# Patient Record
Sex: Male | Born: 1945
Health system: Southern US, Community
[De-identification: ages and names within clinical notes are randomized; demographics above are authoritative.]

## PROBLEM LIST (undated history)

## (undated) DIAGNOSIS — T7840XA Allergy, unspecified, initial encounter: Secondary | ICD-10-CM

## (undated) DIAGNOSIS — E559 Vitamin D deficiency, unspecified: Secondary | ICD-10-CM

## (undated) DIAGNOSIS — H269 Unspecified cataract: Secondary | ICD-10-CM

## (undated) DIAGNOSIS — I219 Acute myocardial infarction, unspecified: Secondary | ICD-10-CM

## (undated) DIAGNOSIS — M810 Age-related osteoporosis without current pathological fracture: Secondary | ICD-10-CM

## (undated) DIAGNOSIS — I10 Essential (primary) hypertension: Secondary | ICD-10-CM

## (undated) DIAGNOSIS — M109 Gout, unspecified: Secondary | ICD-10-CM

## (undated) DIAGNOSIS — N400 Enlarged prostate without lower urinary tract symptoms: Secondary | ICD-10-CM

## (undated) DIAGNOSIS — I2699 Other pulmonary embolism without acute cor pulmonale: Secondary | ICD-10-CM

## (undated) DIAGNOSIS — F329 Major depressive disorder, single episode, unspecified: Secondary | ICD-10-CM

## (undated) DIAGNOSIS — E7800 Pure hypercholesterolemia, unspecified: Secondary | ICD-10-CM

## (undated) DIAGNOSIS — G709 Myoneural disorder, unspecified: Secondary | ICD-10-CM

## (undated) DIAGNOSIS — F419 Anxiety disorder, unspecified: Secondary | ICD-10-CM

## (undated) DIAGNOSIS — E213 Hyperparathyroidism, unspecified: Secondary | ICD-10-CM

## (undated) HISTORY — PX: TRANSURETHRAL RESECTION OF PROSTATE: SHX73

## (undated) HISTORY — DX: Age-related osteoporosis without current pathological fracture: M81.0

## (undated) HISTORY — PX: PERIPHERAL VASCULAR THROMBECTOMY: CATH118306

## (undated) HISTORY — DX: Allergy, unspecified, initial encounter: T78.40XA

## (undated) HISTORY — DX: Unspecified cataract: H26.9

## (undated) HISTORY — PX: CATARACT EXTRACTION: SUR2

## (undated) HISTORY — DX: Hyperparathyroidism, unspecified: E21.3

## (undated) HISTORY — PX: VASECTOMY: SHX75

## (undated) HISTORY — DX: Acute myocardial infarction, unspecified: I21.9

## (undated) HISTORY — DX: Myoneural disorder, unspecified: G70.9

## (undated) HISTORY — DX: Vitamin D deficiency, unspecified: E55.9

## (undated) HISTORY — DX: Anxiety disorder, unspecified: F41.9

---
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
## 2013-12-03 DIAGNOSIS — N400 Enlarged prostate without lower urinary tract symptoms: Secondary | ICD-10-CM | POA: Diagnosis not present

## 2013-12-03 DIAGNOSIS — E782 Mixed hyperlipidemia: Secondary | ICD-10-CM | POA: Diagnosis not present

## 2013-12-03 DIAGNOSIS — E559 Vitamin D deficiency, unspecified: Secondary | ICD-10-CM | POA: Diagnosis not present

## 2013-12-17 DIAGNOSIS — E782 Mixed hyperlipidemia: Secondary | ICD-10-CM | POA: Diagnosis not present

## 2013-12-17 DIAGNOSIS — N400 Enlarged prostate without lower urinary tract symptoms: Secondary | ICD-10-CM | POA: Diagnosis not present

## 2013-12-17 DIAGNOSIS — E559 Vitamin D deficiency, unspecified: Secondary | ICD-10-CM | POA: Diagnosis not present

## 2013-12-25 DIAGNOSIS — E782 Mixed hyperlipidemia: Secondary | ICD-10-CM | POA: Diagnosis not present

## 2013-12-25 DIAGNOSIS — Z0389 Encounter for observation for other suspected diseases and conditions ruled out: Secondary | ICD-10-CM | POA: Diagnosis not present

## 2013-12-25 DIAGNOSIS — Z0000 Encounter for general adult medical examination without abnormal findings: Secondary | ICD-10-CM | POA: Diagnosis not present

## 2013-12-25 DIAGNOSIS — E669 Obesity, unspecified: Secondary | ICD-10-CM | POA: Diagnosis not present

## 2013-12-25 DIAGNOSIS — E559 Vitamin D deficiency, unspecified: Secondary | ICD-10-CM | POA: Diagnosis not present

## 2013-12-26 DIAGNOSIS — R6882 Decreased libido: Secondary | ICD-10-CM | POA: Diagnosis not present

## 2013-12-26 DIAGNOSIS — E559 Vitamin D deficiency, unspecified: Secondary | ICD-10-CM | POA: Diagnosis not present

## 2013-12-26 DIAGNOSIS — Z0000 Encounter for general adult medical examination without abnormal findings: Secondary | ICD-10-CM | POA: Diagnosis not present

## 2013-12-26 DIAGNOSIS — E669 Obesity, unspecified: Secondary | ICD-10-CM | POA: Diagnosis not present

## 2013-12-26 DIAGNOSIS — E782 Mixed hyperlipidemia: Secondary | ICD-10-CM | POA: Diagnosis not present

## 2013-12-26 DIAGNOSIS — R972 Elevated prostate specific antigen [PSA]: Secondary | ICD-10-CM | POA: Diagnosis not present

## 2014-01-19 DIAGNOSIS — E291 Testicular hypofunction: Secondary | ICD-10-CM | POA: Diagnosis not present

## 2014-04-23 DIAGNOSIS — E559 Vitamin D deficiency, unspecified: Secondary | ICD-10-CM | POA: Diagnosis not present

## 2014-04-23 DIAGNOSIS — N400 Enlarged prostate without lower urinary tract symptoms: Secondary | ICD-10-CM | POA: Diagnosis not present

## 2014-04-23 DIAGNOSIS — E669 Obesity, unspecified: Secondary | ICD-10-CM | POA: Diagnosis not present

## 2014-04-23 DIAGNOSIS — E291 Testicular hypofunction: Secondary | ICD-10-CM | POA: Diagnosis not present

## 2014-04-30 DIAGNOSIS — R5383 Other fatigue: Secondary | ICD-10-CM | POA: Diagnosis not present

## 2014-04-30 DIAGNOSIS — E291 Testicular hypofunction: Secondary | ICD-10-CM | POA: Diagnosis not present

## 2014-04-30 DIAGNOSIS — E669 Obesity, unspecified: Secondary | ICD-10-CM | POA: Diagnosis not present

## 2014-04-30 DIAGNOSIS — R5381 Other malaise: Secondary | ICD-10-CM | POA: Diagnosis not present

## 2014-04-30 DIAGNOSIS — E559 Vitamin D deficiency, unspecified: Secondary | ICD-10-CM | POA: Diagnosis not present

## 2014-04-30 DIAGNOSIS — N400 Enlarged prostate without lower urinary tract symptoms: Secondary | ICD-10-CM | POA: Diagnosis not present

## 2014-07-30 DIAGNOSIS — E782 Mixed hyperlipidemia: Secondary | ICD-10-CM | POA: Diagnosis not present

## 2014-07-30 DIAGNOSIS — E559 Vitamin D deficiency, unspecified: Secondary | ICD-10-CM | POA: Diagnosis not present

## 2014-07-30 DIAGNOSIS — E669 Obesity, unspecified: Secondary | ICD-10-CM | POA: Diagnosis not present

## 2014-07-30 DIAGNOSIS — E291 Testicular hypofunction: Secondary | ICD-10-CM | POA: Diagnosis not present

## 2014-08-13 DIAGNOSIS — N400 Enlarged prostate without lower urinary tract symptoms: Secondary | ICD-10-CM | POA: Diagnosis not present

## 2014-08-13 DIAGNOSIS — E559 Vitamin D deficiency, unspecified: Secondary | ICD-10-CM | POA: Diagnosis not present

## 2014-08-13 DIAGNOSIS — E669 Obesity, unspecified: Secondary | ICD-10-CM | POA: Diagnosis not present

## 2014-08-13 DIAGNOSIS — E291 Testicular hypofunction: Secondary | ICD-10-CM | POA: Diagnosis not present

## 2014-08-13 DIAGNOSIS — E782 Mixed hyperlipidemia: Secondary | ICD-10-CM | POA: Diagnosis not present

## 2014-12-03 DIAGNOSIS — I10 Essential (primary) hypertension: Secondary | ICD-10-CM | POA: Diagnosis not present

## 2015-04-01 DIAGNOSIS — E785 Hyperlipidemia, unspecified: Secondary | ICD-10-CM | POA: Diagnosis not present

## 2015-04-01 DIAGNOSIS — E559 Vitamin D deficiency, unspecified: Secondary | ICD-10-CM | POA: Diagnosis not present

## 2015-04-01 DIAGNOSIS — R972 Elevated prostate specific antigen [PSA]: Secondary | ICD-10-CM | POA: Diagnosis not present

## 2015-04-01 DIAGNOSIS — E291 Testicular hypofunction: Secondary | ICD-10-CM | POA: Diagnosis not present

## 2015-04-01 DIAGNOSIS — N400 Enlarged prostate without lower urinary tract symptoms: Secondary | ICD-10-CM | POA: Diagnosis not present

## 2015-04-01 DIAGNOSIS — R5381 Other malaise: Secondary | ICD-10-CM | POA: Diagnosis not present

## 2015-04-01 DIAGNOSIS — Z131 Encounter for screening for diabetes mellitus: Secondary | ICD-10-CM | POA: Diagnosis not present

## 2015-04-01 DIAGNOSIS — I10 Essential (primary) hypertension: Secondary | ICD-10-CM | POA: Diagnosis not present

## 2015-04-20 ENCOUNTER — Ambulatory Visit (HOSPITAL_COMMUNITY)
Admission: RE | Admit: 2015-04-20 | Discharge: 2015-04-20 | Disposition: A | Discharge status: Home / Self Care | Payer: Medicare Other | Source: Ambulatory Visit | Attending: Internal Medicine | Admitting: Internal Medicine

## 2015-04-20 ENCOUNTER — Other Ambulatory Visit (HOSPITAL_COMMUNITY): Payer: Self-pay | Admitting: Internal Medicine

## 2015-04-20 DIAGNOSIS — R509 Fever, unspecified: Secondary | ICD-10-CM | POA: Diagnosis not present

## 2015-04-20 DIAGNOSIS — F419 Anxiety disorder, unspecified: Secondary | ICD-10-CM | POA: Diagnosis not present

## 2015-04-20 DIAGNOSIS — R079 Chest pain, unspecified: Secondary | ICD-10-CM | POA: Diagnosis not present

## 2015-04-20 DIAGNOSIS — N400 Enlarged prostate without lower urinary tract symptoms: Secondary | ICD-10-CM

## 2015-04-20 DIAGNOSIS — I10 Essential (primary) hypertension: Secondary | ICD-10-CM | POA: Diagnosis not present

## 2015-04-20 DIAGNOSIS — R0789 Other chest pain: Secondary | ICD-10-CM | POA: Insufficient documentation

## 2015-04-20 DIAGNOSIS — E539 Vitamin B deficiency, unspecified: Secondary | ICD-10-CM

## 2015-04-20 DIAGNOSIS — R972 Elevated prostate specific antigen [PSA]: Secondary | ICD-10-CM

## 2015-04-20 DIAGNOSIS — J9811 Atelectasis: Secondary | ICD-10-CM | POA: Diagnosis not present

## 2015-04-20 DIAGNOSIS — J449 Chronic obstructive pulmonary disease, unspecified: Secondary | ICD-10-CM | POA: Diagnosis not present

## 2015-04-20 DIAGNOSIS — R0781 Pleurodynia: Secondary | ICD-10-CM

## 2015-04-20 DIAGNOSIS — I2699 Other pulmonary embolism without acute cor pulmonale: Secondary | ICD-10-CM | POA: Diagnosis not present

## 2015-04-20 DIAGNOSIS — R071 Chest pain on breathing: Secondary | ICD-10-CM | POA: Diagnosis not present

## 2015-04-20 DIAGNOSIS — J90 Pleural effusion, not elsewhere classified: Secondary | ICD-10-CM | POA: Diagnosis not present

## 2015-04-20 DIAGNOSIS — R0602 Shortness of breath: Secondary | ICD-10-CM | POA: Diagnosis not present

## 2015-04-20 DIAGNOSIS — E780 Pure hypercholesterolemia: Secondary | ICD-10-CM | POA: Diagnosis not present

## 2015-04-20 DIAGNOSIS — E785 Hyperlipidemia, unspecified: Secondary | ICD-10-CM | POA: Diagnosis not present

## 2015-04-21 ENCOUNTER — Encounter (HOSPITAL_COMMUNITY): Payer: Self-pay

## 2015-04-21 ENCOUNTER — Other Ambulatory Visit: Payer: Self-pay

## 2015-04-21 ENCOUNTER — Inpatient Hospital Stay (HOSPITAL_COMMUNITY)
Admission: EM | Admit: 2015-04-21 | Discharge: 2015-04-22 | DRG: 176 | Disposition: A | Discharge status: Home / Self Care | Payer: Medicare Other | Attending: Internal Medicine | Admitting: Internal Medicine

## 2015-04-21 ENCOUNTER — Emergency Department (HOSPITAL_COMMUNITY): Payer: Medicare Other

## 2015-04-21 ENCOUNTER — Inpatient Hospital Stay (HOSPITAL_COMMUNITY): Payer: Medicare Other

## 2015-04-21 DIAGNOSIS — E785 Hyperlipidemia, unspecified: Secondary | ICD-10-CM | POA: Diagnosis present

## 2015-04-21 DIAGNOSIS — J9811 Atelectasis: Secondary | ICD-10-CM | POA: Diagnosis not present

## 2015-04-21 DIAGNOSIS — R0602 Shortness of breath: Secondary | ICD-10-CM | POA: Diagnosis present

## 2015-04-21 DIAGNOSIS — I2699 Other pulmonary embolism without acute cor pulmonale: Secondary | ICD-10-CM

## 2015-04-21 DIAGNOSIS — F419 Anxiety disorder, unspecified: Secondary | ICD-10-CM | POA: Diagnosis present

## 2015-04-21 DIAGNOSIS — N400 Enlarged prostate without lower urinary tract symptoms: Secondary | ICD-10-CM | POA: Diagnosis present

## 2015-04-21 DIAGNOSIS — Z86711 Personal history of pulmonary embolism: Secondary | ICD-10-CM | POA: Diagnosis present

## 2015-04-21 DIAGNOSIS — E780 Pure hypercholesterolemia: Secondary | ICD-10-CM | POA: Diagnosis present

## 2015-04-21 DIAGNOSIS — I10 Essential (primary) hypertension: Secondary | ICD-10-CM

## 2015-04-21 DIAGNOSIS — M109 Gout, unspecified: Secondary | ICD-10-CM | POA: Diagnosis present

## 2015-04-21 DIAGNOSIS — J90 Pleural effusion, not elsewhere classified: Secondary | ICD-10-CM | POA: Diagnosis not present

## 2015-04-21 DIAGNOSIS — E782 Mixed hyperlipidemia: Secondary | ICD-10-CM

## 2015-04-21 DIAGNOSIS — J449 Chronic obstructive pulmonary disease, unspecified: Secondary | ICD-10-CM | POA: Diagnosis not present

## 2015-04-21 HISTORY — DX: Pure hypercholesterolemia, unspecified: E78.00

## 2015-04-21 HISTORY — DX: Benign prostatic hyperplasia without lower urinary tract symptoms: N40.0

## 2015-04-21 HISTORY — DX: Gout, unspecified: M10.9

## 2015-04-21 HISTORY — DX: Essential (primary) hypertension: I10

## 2015-04-21 LAB — CBC WITH DIFFERENTIAL/PLATELET
Basophils Absolute: 0 10*3/uL (ref 0.0–0.1)
Basophils Relative: 0 % (ref 0–1)
Eosinophils Absolute: 0.1 10*3/uL (ref 0.0–0.7)
Eosinophils Relative: 1 % (ref 0–5)
HEMATOCRIT: 47.5 % (ref 39.0–52.0)
HEMOGLOBIN: 16.3 g/dL (ref 13.0–17.0)
LYMPHS ABS: 1.4 10*3/uL (ref 0.7–4.0)
Lymphocytes Relative: 11 % — ABNORMAL LOW (ref 12–46)
MCH: 33.7 pg (ref 26.0–34.0)
MCHC: 34.3 g/dL (ref 30.0–36.0)
MCV: 98.3 fL (ref 78.0–100.0)
MONOS PCT: 8 % (ref 3–12)
Monocytes Absolute: 1 10*3/uL (ref 0.1–1.0)
NEUTROS PCT: 80 % — AB (ref 43–77)
Neutro Abs: 10.4 10*3/uL — ABNORMAL HIGH (ref 1.7–7.7)
Platelets: 140 10*3/uL — ABNORMAL LOW (ref 150–400)
RBC: 4.83 MIL/uL (ref 4.22–5.81)
RDW: 12.7 % (ref 11.5–15.5)
WBC: 12.9 10*3/uL — ABNORMAL HIGH (ref 4.0–10.5)

## 2015-04-21 LAB — COMPREHENSIVE METABOLIC PANEL
ALK PHOS: 60 U/L (ref 38–126)
ALT: 14 U/L — AB (ref 17–63)
ANION GAP: 11 (ref 5–15)
AST: 21 U/L (ref 15–41)
Albumin: 4.4 g/dL (ref 3.5–5.0)
BILIRUBIN TOTAL: 1.5 mg/dL — AB (ref 0.3–1.2)
BUN: 14 mg/dL (ref 6–20)
CO2: 24 mmol/L (ref 22–32)
Calcium: 10.8 mg/dL — ABNORMAL HIGH (ref 8.9–10.3)
Chloride: 104 mmol/L (ref 101–111)
Creatinine, Ser: 1.03 mg/dL (ref 0.61–1.24)
GFR calc Af Amer: >60 mL/min (ref >60)
GLUCOSE: 136 mg/dL — AB (ref 65–99)
POTASSIUM: 4.5 mmol/L (ref 3.5–5.1)
SODIUM: 139 mmol/L (ref 135–145)
TOTAL PROTEIN: 8.2 g/dL — AB (ref 6.5–8.1)

## 2015-04-21 LAB — I-STAT TROPONIN, ED: TROPONIN I, POC: 0.01 ng/mL (ref 0.00–0.08)

## 2015-04-21 MED ORDER — HYDROMORPHONE HCL 1 MG/ML IJ SOLN
0.5000 mg | Freq: Once | INTRAMUSCULAR | Status: AC
  Administered 2015-04-21: 0.5 mg via INTRAVENOUS
  Filled 2015-04-21: qty 1

## 2015-04-21 MED ORDER — OXYCODONE HCL 5 MG PO TABS
5.0000 mg | ORAL_TABLET | ORAL | Status: DC | PRN
  Administered 2015-04-21 – 2015-04-22 (×4): 5 mg via ORAL
  Filled 2015-04-21 (×4): qty 1

## 2015-04-21 MED ORDER — SIMVASTATIN 20 MG PO TABS
20.0000 mg | ORAL_TABLET | ORAL | Status: DC
  Filled 2015-04-21: qty 1

## 2015-04-21 MED ORDER — ACETAMINOPHEN 325 MG PO TABS
650.0000 mg | ORAL_TABLET | Freq: Four times a day (QID) | ORAL | Status: DC | PRN
  Administered 2015-04-21: 650 mg via ORAL
  Filled 2015-04-21: qty 2

## 2015-04-21 MED ORDER — ACETAMINOPHEN 650 MG RE SUPP: 650.0000 mg | Freq: Four times a day (QID) | RECTAL | Status: DC | PRN

## 2015-04-21 MED ORDER — VENLAFAXINE HCL ER 75 MG PO CP24
150.0000 mg | ORAL_CAPSULE | Freq: Every day | ORAL | Status: DC
  Administered 2015-04-22: 150 mg via ORAL
  Filled 2015-04-21: qty 2

## 2015-04-21 MED ORDER — ONDANSETRON HCL 4 MG/2ML IJ SOLN: 4.0000 mg | Freq: Four times a day (QID) | INTRAMUSCULAR | Status: DC | PRN

## 2015-04-21 MED ORDER — SODIUM CHLORIDE 0.9 % IJ SOLN
3.0000 mL | Freq: Two times a day (BID) | INTRAMUSCULAR | Status: DC
Start: 2015-04-21 — End: 2015-04-22
  Administered 2015-04-21 – 2015-04-22 (×2): 3 mL via INTRAVENOUS

## 2015-04-21 MED ORDER — ENSURE ENLIVE PO LIQD
237.0000 mL | Freq: Two times a day (BID) | ORAL | Status: DC
  Administered 2015-04-21 – 2015-04-22 (×2): 237 mL via ORAL

## 2015-04-21 MED ORDER — IOHEXOL 350 MG/ML SOLN
100.0000 mL | Freq: Once | INTRAVENOUS | Status: AC | PRN
  Administered 2015-04-21: 100 mL via INTRAVENOUS

## 2015-04-21 MED ORDER — ONDANSETRON HCL 4 MG PO TABS
4.0000 mg | ORAL_TABLET | Freq: Four times a day (QID) | ORAL | Status: DC | PRN
  Administered 2015-04-21: 4 mg via ORAL
  Filled 2015-04-21: qty 1

## 2015-04-21 MED ORDER — LIDOCAINE HCL (PF) 2 % IJ SOLN
INTRAMUSCULAR | Status: AC
  Filled 2015-04-21: qty 10

## 2015-04-21 MED ORDER — SENNOSIDES-DOCUSATE SODIUM 8.6-50 MG PO TABS
1.0000 | ORAL_TABLET | Freq: Every evening | ORAL | Status: DC | PRN
  Administered 2015-04-22: 1 via ORAL
  Filled 2015-04-21: qty 1

## 2015-04-21 MED ORDER — RIVAROXABAN 15 MG PO TABS
15.0000 mg | ORAL_TABLET | Freq: Two times a day (BID) | ORAL | Status: DC
  Administered 2015-04-21 – 2015-04-22 (×2): 15 mg via ORAL
  Filled 2015-04-21 (×2): qty 1

## 2015-04-21 MED ORDER — MORPHINE SULFATE 2 MG/ML IJ SOLN
1.0000 mg | INTRAMUSCULAR | Status: DC | PRN
  Administered 2015-04-21: 1 mg via INTRAVENOUS
  Filled 2015-04-21: qty 1

## 2015-04-21 MED ORDER — SODIUM CHLORIDE 0.9 % IV SOLN
INTRAVENOUS | Status: DC
  Administered 2015-04-21: 15:00:00 via INTRAVENOUS

## 2015-04-21 MED ORDER — RIVAROXABAN 20 MG PO TABS: 20.0000 mg | ORAL_TABLET | Freq: Every day | ORAL | Status: DC

## 2015-04-21 MED ORDER — ALLOPURINOL 300 MG PO TABS
300.0000 mg | ORAL_TABLET | Freq: Every day | ORAL | Status: DC
  Administered 2015-04-22: 300 mg via ORAL
  Filled 2015-04-21: qty 1

## 2015-04-21 MED ORDER — ENOXAPARIN SODIUM 100 MG/ML ~~LOC~~ SOLN
1.0000 mg/kg | Freq: Once | SUBCUTANEOUS | Status: AC
  Administered 2015-04-21: 85 mg via SUBCUTANEOUS
  Filled 2015-04-21: qty 1

## 2015-04-21 NOTE — ED Notes (Addendum)
Attempted report x1. Spoke with Diplomatic Services operational officer who states she needs to notify RN of patient and RN will call back shortly.

## 2015-04-21 NOTE — H&P (Signed)
[facility]          History and Physical    PCP:   Doree Albee, MD   EDP: Quintella Reichert, MD  Chief Complaint:  SOB   HPI: 60yom presented with SOB, back pain after PCP recommended pt receive XR for these symptoms. Reports left leg edema, on and off for a couple of months.  Symptoms started two days ago. Severity has increased overnight.  He has a mild non productive cough, no CP, abd pain, v/n/d.  Pain worseness when breathing.  Pt reports traveling for a long car ride in June and noticed the left leg edema around that time. Reports traveling long periods of time multiple times. He notes symptoms to be severe and worsening.      PMHx includes, hypertension, BPH, hyperlipidemia , right bundle branch. Reports he decreased EtOH use over the past few weeks.    Allergies:   Allergies  Allergen Reactions  . Sulfa Antibiotics Hives      Past Medical History  Diagnosis Date  . Hypertension   . BPH (benign prostatic hyperplasia)   . Gout   . Hypercholesterolemia     History reviewed. No pertinent past surgical history.  Prior to Admission medications   Medication Sig Start Date End Date Taking? Authorizing Provider  acetaminophen (TYLENOL) 325 MG tablet Take 650 mg by mouth every 6 (six) hours as needed for fever.   Yes Historical Provider, MD  allopurinol (ZYLOPRIM) 300 MG tablet Take 300 mg by mouth daily.   Yes Historical Provider, MD  Cholecalciferol (VITAMIN D) 2000 UNITS CAPS Take 1 capsule by mouth daily.   Yes Historical Provider, MD  ibuprofen (ADVIL,MOTRIN) 200 MG tablet Take 600 mg by mouth daily as needed for moderate pain.   Yes Historical Provider, MD  simvastatin (ZOCOR) 20 MG tablet Take 20 mg by mouth every other day.   Yes Historical Provider, MD  venlafaxine XR (EFFEXOR-XR) 150 MG 24 hr capsule Take 150 mg by mouth daily with breakfast.   Yes Historical Provider, MD    Social History:  reports that he has never smoked. He does  not have any smokeless tobacco history on file. He reports that he drinks alcohol. He reports that he does not use illicit drugs.  Family history: Brother had DVT Maternal grandparents - hx of cancer  Review of Systems:  Constitutional: Denies fever, chills, diaphoresis, appetite change and fatigue.  HEENT: Denies photophobia, eye pain, redness, hearing loss, ear pain, congestion, sore throat, rhinorrhea, sneezing, mouth sores, trouble swallowing, neck pain, neck stiffness and tinnitus.   Respiratory: Denies  cough, chest tightness,  and wheezing.   Cardiovascular: Denies palpitations  Gastrointestinal: Denies nausea, vomiting, abdominal pain, diarrhea, constipation, blood in stool and abdominal distention.  Genitourinary: Denies dysuria, urgency, frequency, hematuria, flank pain and difficulty urinating.  Endocrine: Denies: hot or cold intolerance, sweats, changes in hair or nails, polyuria, polydipsia. Musculoskeletal: Denies myalgias, back pain, joint swelling, arthralgias and gait problem.  Skin: Denies pallor, rash and wound.  Neurological: Denies dizziness, seizures, syncope, weakness, light-headedness, numbness and headaches.  Hematological: Denies adenopathy. Easy bruising, personal or family bleeding history  Psychiatric/Behavioral: Denies suicidal ideation, mood changes, confusion, nervousness, sleep disturbance and agitation   Physical Exam: Blood pressure 151/91, pulse 82, temperature 98.1 F (36.7 C), temperature source Oral, resp. rate 29, height 5' 9"  (1.753 m), weight 83.462 kg (184 lb), SpO2 90 %.  General:  Appears comfortable, calm, sitting up in chair. Eyes: PERRL, normal lids, irises ENT: grossly normal hearing, lips, tongue Neck: no LAD, masses, thyromegaly Cardiovascular: Regular rate and rhythm, no murmur, rub or gallop. Respiratory: Clear to auscultation bilaterally, no wheezes, rales or rhonchi. Pain increases with breathing Abdomen: soft, ntnd Skin: no  rash or induration  Musculoskeletal: left leg edema, not present in right  Psychiatric: grossly normal mood and affect, speech fluent and appropriate Neurologic: grossly non-focal.   Labs on Admission:  Results for orders placed or performed during the hospital encounter of 04/21/15 (from the past 48 hour(s))  Comprehensive metabolic panel     Status: Abnormal   Collection Time: 04/21/15  7:39 AM  Result Value Ref Range   Sodium 139 135 - 145 mmol/L   Potassium 4.5 3.5 - 5.1 mmol/L   Chloride 104 101 - 111 mmol/L   CO2 24 22 - 32 mmol/L   Glucose, Bld 136 (H) 65 - 99 mg/dL   BUN 14 6 - 20 mg/dL   Creatinine, Ser 1.03 0.61 - 1.24 mg/dL   Calcium 10.8 (H) 8.9 - 10.3 mg/dL   Total Protein 8.2 (H) 6.5 - 8.1 g/dL   Albumin 4.4 3.5 - 5.0 g/dL   AST 21 15 - 41 U/L   ALT 14 (L) 17 - 63 U/L   Alkaline Phosphatase 60 38 - 126 U/L   Total Bilirubin 1.5 (H) 0.3 - 1.2 mg/dL   GFR calc non Af Amer >60 >60 mL/min   GFR calc Af Amer >60 >60 mL/min    Comment: (NOTE) The eGFR has been calculated using the CKD EPI equation. This calculation has not been validated in all clinical situations. eGFR's persistently <60 mL/min signify possible Chronic Kidney Disease.    Anion gap 11 5 - 15  CBC with Differential     Status: Abnormal   Collection Time: 04/21/15  7:39 AM  Result Value Ref Range   WBC 12.9 (H) 4.0 - 10.5 K/uL   RBC 4.83 4.22 - 5.81 MIL/uL   Hemoglobin 16.3 13.0 - 17.0 g/dL   HCT 47.5 39.0 - 52.0 %   MCV 98.3 78.0 - 100.0 fL   MCH 33.7 26.0 - 34.0 pg   MCHC 34.3 30.0 - 36.0 g/dL   RDW 12.7 11.5 - 15.5 %   Platelets 140 (L) 150 - 400 K/uL   Neutrophils Relative % 80 (H) 43 - 77 %   Neutro Abs 10.4 (H) 1.7 - 7.7 K/uL   Lymphocytes Relative 11 (L) 12 - 46 %   Lymphs Abs 1.4 0.7 - 4.0 K/uL   Monocytes Relative 8 3 - 12 %   Monocytes Absolute 1.0 0.1 - 1.0 K/uL   Eosinophils Relative 1 0 - 5 %   Eosinophils Absolute 0.1 0.0 - 0.7 K/uL   Basophils Relative 0 0 - 1 %   Basophils  Absolute 0.0 0.0 - 0.1 K/uL  I-stat troponin, ED     Status: None   Collection Time: 04/21/15  7:47 AM  Result Value Ref Range   Troponin i, poc 0.01 0.00 - 0.08 ng/mL   Comment 3            Comment: Due to the release kinetics of cTnI, a negative result within the first hours of the onset of symptoms does not rule out myocardial infarction with certainty. If myocardial infarction is still suspected, repeat the test at appropriate intervals.     Radiological Exams on Admission: Dg  Chest 2 View  04/20/2015   CLINICAL DATA:  Lower right-sided chest wall pain with low-grade fever and pleuritic chest pain since yesterday  EXAM: CHEST  2 VIEW  COMPARISON:  None in PACs  FINDINGS: The lungs are borderline hypoinflated. There is bibasilar atelectasis. A trace of pleural fluid is suspected blunting the costophrenic angles laterally. The heart is top-normal in size. The pulmonary vascularity is normal. The trachea is midline. The bony thorax exhibits no acute abnormality.  IMPRESSION: Bibasilar atelectasis and possible tiny pleural effusions. Correlation with patient's clinical and laboratory values is needed. Chest CT scanning may be useful to exclude underlying pulmonary embolism.   Electronically Signed   By: David  Martinique M.D.   On: 04/20/2015 11:03   Ct Angio Chest Pe W/cm &/or Wo Cm  04/21/2015   CLINICAL DATA:  Short of breath.  Right chest pain  EXAM: CT ANGIOGRAPHY CHEST WITH CONTRAST  TECHNIQUE: Multidetector CT imaging of the chest was performed using the standard protocol during bolus administration of intravenous contrast. Multiplanar CT image reconstructions and MIPs were obtained to evaluate the vascular anatomy.  CONTRAST:  160m OMNIPAQUE IOHEXOL 350 MG/ML SOLN  COMPARISON:  Chest x-ray 04/20/2015  FINDINGS: Bilateral pulmonary pulmonary emboli in the lower lobes bilaterally. Small amount of clot in the left upper lobe. Moderate clot burden. Pulmonary arteries normal in caliber. RV/ LV  ratio 1.4 suggesting mild right heart strain.  Thoracic aorta normal in caliber. No dissection. Negative for pericardial effusion.  Elevated right hemidiaphragm. Small right pleural effusion. Bibasilar atelectasis right greater than left.  Negative for mass or adenopathy.  COPD with mild apical emphysema. Mild mosaic density in the upper lobes bilaterally may be related to mild edema.  Calcification in the dome of the liver on the right compatible with granulomatous chronic infection.  No acute skeletal abnormality.  Review of the MIP images confirms the above findings.  IMPRESSION: Moderate bilateral pulmonary emboli. Mild right heart strain with artery/ LV ratio 1.4  Mild COPD  Small right effusion.  Mild bibasilar atelectasis.  Critical Value/emergent results were called by telephone at the time of interpretation on 04/21/2015 at 9:02 am to Dr. EQuintella Reichert, who verbally acknowledged these results.   Electronically Signed   By: CFranchot GalloM.D.   On: 04/21/2015 09:04    Assessment/Plan Active Problems:   Pulmonary embolism  PE - Start Xarelto - Because of heart strain seen on CT will order ECHO - Will likely need Xarelto for 6 months, seems to be a provoked episode given recent long car rides  HTN - not on any medications currently, will follow  HLD - Continue statin  Anxiety disorder - Continue Effexor   Time Spent on Admission: 60 minutes   EDomingo Mend MD [facility] Pager: [phone] 04/21/2015, 11:13 AM   I, ASalvadore Oxford acting a scribe, recorded this note contemporaneously in the presence of Dr. EDomingo Mendon 04/21/2015   I have reviewed the above documentation for accuracy and completeness, and I agree with the above.  EDomingo Mend MD [facility] Pager: [phone]

## 2015-04-21 NOTE — ED Notes (Signed)
Gave pt water to drink with MD Madilyn Hook approval.

## 2015-04-21 NOTE — ED Notes (Signed)
Pt c/o of increased pain after CT scan. MD Madilyn Hook aware.

## 2015-04-21 NOTE — ED Notes (Signed)
MD Rees at bedside updating patient and family.  

## 2015-04-21 NOTE — Progress Notes (Signed)
Initial Nutrition Assessment  DOCUMENTATION CODES:  Not applicable  INTERVENTION:  Ensure Enlive po BID, each supplement provides 350 kcal and 20 grams of protein  Helped him with his dinner options  NUTRITION DIAGNOSIS:  Inadequate oral intake related to poor appetite as evidenced by reportedly losing 10 lbs over the last 6 weeks  GOAL:  Patient will meet greater than or equal to 90% of their needs  MONITOR:  PO intake, Supplement acceptance, Labs  REASON FOR ASSESSMENT:  Malnutrition Screening Tool    ASSESSMENT:  68yom PMHx: HTN, BPH, HLD, Gout presents with SOB, back pain. Reports left leg edema, on and off for a couple of months. He has a mild non productive cough,. Pain worseness when breathing. Diagnosed with Pulmonary Embolism.   Pt states that he has not been feeling well for 6 weeks and has had severe pain for a couple days. He has not been eating quite as well for this time period. Denies n/v/d. He is constipated. At home he reports eating a lot of vegetables and he was saying that the hospital food is much too salty and includes too many starches. He mainly eats fresh foods. Took down a couple dinner preferences/requests.   He states his normal weight is 195 lbs. There is no prior documentation to confirm this  He was agreeable to Ensure. He drinks a protein drink for breakfast each morning.   Diet Order:  Diet Heart Room service appropriate?: Yes; Fluid consistency:: Thin  Skin:  Reviewed, no issues  Last BM:  8/9  Height:  Ht Readings from Last 1 Encounters:  04/21/15  (1.753 m)   Weight:  Wt Readings from Last 1 Encounters:  04/21/15 184 lb (83.462 kg)   Ideal Body Weight:  72.7 kg  BMI:  Body mass index is 27.16 kg/(m^2).  Estimated Nutritional Needs:  Kcal:  1750-1900 kcals, (21-23 kcal/kg) Protein:  73-87 (1-1.2 g/kg IBW) Fluid:  1.8-1.9 liters  EDUCATION NEEDS:  Education needs addressed  Christophe Louis RD, LDN Nutrition Pager:  [phone] 04/21/2015 3:45 PM

## 2015-04-21 NOTE — ED Provider Notes (Signed)
CSN: [id number]     Arrival date & time 04/21/15  6295 History   First MD Initiated Contact with Patient 04/21/15 [phone]     Chief Complaint  Patient presents with  . Shortness of Breath     Patient is a 69 y.o. male presenting with shortness of breath. The history is provided by the patient and the spouse. No language interpreter was used.  Shortness of Breath  Mr. Valley presents for evaluation of thoracic back pain on the right side as well shortness of breath. Symptoms started 2 days ago and were sudden in onset. He has pain with deep breathing. He saw his family doctor yesterday for the symptoms during a follow-up for intermittent left leg swelling for the last month. He had a chest x-ray was told he had some fluid on his lungs and he needed to get evaluated in the emergency department. He's had fevers to 100.4 intermittently for the last several weeks as well as night sweats. He has a slight cough, no chest pain, abdominal pain, vomiting, diarrhea. He has a history of hypertension, hyperlipidemia, BPH, gout. Symptoms are severe, constant, worsening.  Past Medical History  Diagnosis Date  . Hypertension   . BPH (benign prostatic hyperplasia)   . Gout   . Hypercholesterolemia    History reviewed. No pertinent past surgical history. No family history on file. Social History  Substance Use Topics  . Smoking status: Never Smoker   . Smokeless tobacco: None  . Alcohol Use: Yes     Comment: former    Review of Systems  Respiratory: Positive for shortness of breath.   All other systems reviewed and are negative.     Allergies  Sulfa antibiotics  Home Medications   Prior to Admission medications   Not on File   BP 158/87 mmHg  Pulse 76  Temp(Src) 98.1 F (36.7 C) (Oral)  Resp 18  Ht 5\' 9"  (1.753 m)  Wt 184 lb (83.462 kg)  BMI 27.16 kg/m2  SpO2 95% Physical Exam  Constitutional: He is oriented to person, place, and time. He appears well-developed and well-nourished.   HENT:  Head: Normocephalic and atraumatic.  Cardiovascular: Normal rate and regular rhythm.   No murmur heard. Pulmonary/Chest: Effort normal.  Tachypnea with occasional fine crackles in bilateral bases  Abdominal: Soft. There is no tenderness. There is no rebound and no guarding.  Musculoskeletal: He exhibits no tenderness.  Trace edema in the left lower extremity  Neurological: He is alert and oriented to person, place, and time.  Skin: Skin is warm and dry.  Psychiatric: He has a normal mood and affect. His behavior is normal.  Nursing note and vitals reviewed.   ED Course  Procedures (including critical care time) Labs Review Labs Reviewed  COMPREHENSIVE METABOLIC PANEL - Abnormal; Notable for the following:    Glucose, Bld 136 (*)    Calcium 10.8 (*)    Total Protein 8.2 (*)    ALT 14 (*)    Total Bilirubin 1.5 (*)    All other components within normal limits  CBC WITH DIFFERENTIAL/PLATELET - Abnormal; Notable for the following:    WBC 12.9 (*)    Platelets 140 (*)    Neutrophils Relative % 80 (*)    Neutro Abs 10.4 (*)    Lymphocytes Relative 11 (*)    All other components within normal limits  Rosezena Sensor, ED    Imaging Review Dg Chest 2 View  04/20/2015   CLINICAL DATA:  Lower  right-sided chest wall pain with low-grade fever and pleuritic chest pain since yesterday  EXAM: CHEST  2 VIEW  COMPARISON:  None in PACs  FINDINGS: The lungs are borderline hypoinflated. There is bibasilar atelectasis. A trace of pleural fluid is suspected blunting the costophrenic angles laterally. The heart is top-normal in size. The pulmonary vascularity is normal. The trachea is midline. The bony thorax exhibits no acute abnormality.  IMPRESSION: Bibasilar atelectasis and possible tiny pleural effusions. Correlation with patient's clinical and laboratory values is needed. Chest CT scanning may be useful to exclude underlying pulmonary embolism.   Electronically Signed   By: David  [place]  M.D.   On: 04/20/2015 11:03     EKG Interpretation   Date/Time:  Wednesday April 21 2015 07:22:22 EDT Ventricular Rate:  77 PR Interval:  135 QRS Duration: 142 QT Interval:  396 QTC Calculation: 448 R Axis:   20 Text Interpretation:  Sinus rhythm Right bundle branch block Confirmed by  Lincoln Brigham [phone]) on 04/21/2015 7:54:34 AM      MDM   Final diagnoses:  Acute pulmonary embolism    Patient here for evaluation of pleuritic chest pain. Concern for PE on initial history and exam, started on Lovenox. CT scan is consistent with acute pulmonary embolism. Updated patient of findings of studies and updated his primary care provider. Discussed with hospitalist regarding admission for further treatment. Patient was started on nasal oxygen for comfort given sats in the low 90s (90-91% on room air).  Tilden Fossa, MD 04/21/15 [phone]

## 2015-04-21 NOTE — Discharge Instructions (Signed)
Information on my medicine - XARELTO (rivaroxaban)  This medication education was reviewed with me or my healthcare representative as part of my discharge preparation.  The pharmacist that spoke with me during my hospital stay was:  Anamika Kueker, [facility], [facility]  WHY WAS XARELTO PRESCRIBED FOR YOU? Xarelto was prescribed to treat blood clots that may have been found in the veins of your legs (deep vein thrombosis) or in your lungs (pulmonary embolism) and to reduce the risk of them occurring again.  What do you need to know about Xarelto? The starting dose is one 15 mg tablet taken TWICE daily with food for the FIRST 21 DAYS then on (enter date)  05/13/15  the dose is changed to one 20 mg tablet taken ONCE A DAY with your evening meal.  DO NOT stop taking Xarelto without talking to the health care provider who prescribed the medication.  Refill your prescription for 20 mg tablets before you run out.  After discharge, you should have regular check-up appointments with your healthcare provider that is prescribing your Xarelto.  In the future your dose may need to be changed if your kidney function changes by a significant amount.  What do you do if you miss a dose? If you are taking Xarelto TWICE DAILY and you miss a dose, take it as soon as you remember. You may take two 15 mg tablets (total 30 mg) at the same time then resume your regularly scheduled 15 mg twice daily the next day.  If you are taking Xarelto ONCE DAILY and you miss a dose, take it as soon as you remember on the same day then continue your regularly scheduled once daily regimen the next day. Do not take two doses of Xarelto at the same time.   Important Safety Information Xarelto is a blood thinner medicine that can cause bleeding. You should call your healthcare provider right away if you experience any of the following: ? Bleeding from an injury or your nose that does not stop. ? Unusual colored urine (red or dark brown) or  unusual colored stools (red or black). ? Unusual bruising for unknown reasons. ? A serious fall or if you hit your head (even if there is no bleeding).  Some medicines may interact with Xarelto and might increase your risk of bleeding while on Xarelto. To help avoid this, consult your healthcare provider or pharmacist prior to using any new prescription or non-prescription medications, including herbals, vitamins, non-steroidal anti-inflammatory drugs (NSAIDs) and supplements.  This website has more information on Xarelto: VisitDestination.com.br.

## 2015-04-21 NOTE — ED Notes (Signed)
Placed pt on 2L nasal cannula for comfort. Sats 94%.

## 2015-04-21 NOTE — Progress Notes (Signed)
Respiratory Therapy:  Assessed patient; breathing marginally tachynic; diminished.  On 2lpm ncann; said he is experiencing some pain.  Will monitor.  Charlott Holler RRT

## 2015-04-21 NOTE — ED Notes (Signed)
Pt reports pain in r shoulder blade and sob since Monday.  Reports had chest x ray yesterday and was told that he has a pleural effusion.  Pt waiting to hear from his doctor today.  Pt has been taking ibuprofen for pain.

## 2015-04-22 LAB — BASIC METABOLIC PANEL
ANION GAP: 8 (ref 5–15)
BUN: 11 mg/dL (ref 6–20)
CO2: 25 mmol/L (ref 22–32)
Calcium: 10.1 mg/dL (ref 8.9–10.3)
Chloride: 102 mmol/L (ref 101–111)
Creatinine, Ser: 0.86 mg/dL (ref 0.61–1.24)
GFR calc Af Amer: >60 mL/min (ref >60)
GFR calc non Af Amer: >60 mL/min (ref >60)
GLUCOSE: 108 mg/dL — AB (ref 65–99)
Potassium: 3.9 mmol/L (ref 3.5–5.1)
SODIUM: 135 mmol/L (ref 135–145)

## 2015-04-22 LAB — CBC
HCT: 45.7 % (ref 39.0–52.0)
Hemoglobin: 15.3 g/dL (ref 13.0–17.0)
MCH: 33.1 pg (ref 26.0–34.0)
MCHC: 33.5 g/dL (ref 30.0–36.0)
MCV: 98.9 fL (ref 78.0–100.0)
PLATELETS: 161 10*3/uL (ref 150–400)
RBC: 4.62 MIL/uL (ref 4.22–5.81)
RDW: 12.9 % (ref 11.5–15.5)
WBC: 11.8 10*3/uL — ABNORMAL HIGH (ref 4.0–10.5)

## 2015-04-22 MED ORDER — RIVAROXABAN (XARELTO) VTE STARTER PACK (15 & 20 MG): ORAL_TABLET | ORAL | Status: DC

## 2015-04-22 MED ORDER — OXYCODONE HCL 5 MG PO TABS: 5.0000 mg | ORAL_TABLET | ORAL | Status: DC | PRN

## 2015-04-22 NOTE — Care Management (Signed)
Update on benefits check: no pre-auth co-pay will be $45 for 30-day supply. Patient made aware.

## 2015-04-22 NOTE — Progress Notes (Signed)
Resting room air O2 Sat 88%.

## 2015-04-22 NOTE — Care Management Note (Signed)
Case Management Note  Patient Details  Name: Billy Hunt MRN: [id number] Date of Birth: 1945-09-22   Expected Discharge Date:  04/23/15               Expected Discharge Plan:  Home/Self Care  In-House Referral:  NA  Discharge planning Services  CM Consult  Post Acute Care Choice:  Durable Medical Equipment Choice offered to:  Patient  DME Arranged:  Oxygen DME Agency:  [facility]  HH Arranged:    [facility] Agency:     Status of Service:  Completed, signed off  Medicare Important Message Given:    Date Medicare IM Given:    Medicare IM give by:    Date Additional Medicare IM Given:    Additional Medicare Important Message give by:     If discussed at Long Length of Stay Meetings, dates discussed:    Additional Comments: Pt is from home, lives with spouse (who is a Engineer, civil (consulting)) and is independent at baseline. Pt admitted for PE. Pt discharging home today with self care. Patient started on Xeralto for 6 months. Benefits check attempted for xeralto but CMA unable to complete to do lack of info from ins company. Pt given 30 day free voucher from drug company, encouraged to ask pharmacy cost of co-pay when he has Rx filled and follow up with pcp if unable to afford. Pt meets requirements for home O2. Pt has chosen [facility] for DME needs, Lorinda Creed, of [facility] notified and will obtain pt info from chart. Pt understand he will need to wait for port O2 tank to be delivered before he can discharge. No further CM needs.  Malcolm Metro, RN 04/22/2015, 11:27 AM

## 2015-04-22 NOTE — Progress Notes (Signed)
Discharge instructions reviewed with patient and wife. Understanding verbalized. Incentive Spirometer given to patient and training done by RT. IV removed. Portable oxygen at bedside. Patient ready for discharge home.

## 2015-04-22 NOTE — Discharge Summary (Addendum)
Physician Discharge Summary  Billy Hunt ZOX:[id number] DOB: Sep 18, 1945 DOA: 04/21/2015  PCP: Wilson Singer, MD  Admit date: 04/21/2015 Discharge date: 04/22/2015  Time spent: 30 minutes  Recommendations for Outpatient Follow-up:  1. Continue Xarelto for next 6 months.  2. Follow-up with Lilly Cove, MD before resuming normal activities.  3. Discussed high risk bleeding that may occur while taking Xarelto.  Follow-up with PCP for any bleeding concerns.  4. Will need oxygen short term as sats drop to 88% with ambulation. Will ask CM to arrange.  Discharge Diagnoses:  Principal Problem:   Pulmonary embolism Active Problems:   HTN (hypertension)   Gout   Hyperlipidemia   Discharge Condition: Improved  Diet recommendation: Heart healthy   Filed Weights   04/21/15 0713  Weight: 83.462 kg (184 lb)    History of present illness:   68yom presented with SOB, back pain after PCP recommended pt receive XR for these symptoms. Reports left leg edema, on and off for a couple of months. Symptoms started two days ago. Severity has increased overnight. He has a mild non productive cough, no CP, abd pain, v/n/d. Pain worseness when breathing. Pt reports traveling for a long car ride in June and noticed the left leg edema around that time. Reports traveling long periods of time multiple times. He notes symptoms to be severe and worsening.  PMHx includes, hypertension, BPH, hyperlipidemia , right bundle branch. Reports he decreased EtOH use over the past few weeks.  Hospital Course:   PE -Continue Xarelto for 6 months.  -Echo as below -Will need discussion with PCP for discussion about possible cessation of testerone supplementation, as this has been identified as a risk factor for VTE.  -Will need oxygen given sats of 88%. Due to splinting with pleuritic pain.  HTN -Not on any medications, continue to monitor.  HLD -Continue holding statins.  Anxiety  Disorder -Continue Effexor.  Procedures:  ECHO study conclusion - Left ventricle: The cavity size was normal. Systolic function was vigorous. The estimated ejection fraction was in the range of 65% to 70%. Wall motion was normal; there were no regional wall motion abnormalities. Doppler parameters are consistent with abnormal left ventricular relaxation (grade 1 diastolic dysfunction). Mild to moderate concentric left ventricular hypertrophy. - Ventricular septum: Septal motion showed dyssynergy. These changes are consistent with intraventricular conduction delay. - Aortic valve: There was mild regurgitation. - Mitral valve: Mildly thickened leaflets . There was mild regurgitation. - Right ventricle: The cavity size was mildly to moderately dilated. Systolic function was normal.  Discharge Exam: Filed Vitals:   04/22/15 0549  BP: 154/74  Pulse: 65  Temp: 98.5 F (36.9 C)  Resp: 17    General:  NAD, appears calm and comfortable, lying in bed, afebrile  Cardiovascular: RRR, no m/r/g  Respiratory: CTAB, no w/r/r  Abdomen: soft, positive bowel sounds, no distension  Extremities: LLE non pitting edema.  Neurologic:  Non focal  Discharge Instructions   Discharge Instructions    Diet - low sodium heart healthy    Complete by:  As directed      Increase activity slowly    Complete by:  As directed           Current Discharge Medication List    START taking these medications   Details  oxyCODONE (OXY IR/ROXICODONE) 5 MG immediate release tablet Take 1 tablet (5 mg total) by mouth every 4 (four) hours as needed for moderate pain. Qty: 30 tablet, Refills: 0  Rivaroxaban (XARELTO STARTER PACK) 15 & 20 MG TBPK Take as directed on package: Start with one  tablet by mouth twice a day with food. On Day 22, switch to one  tablet once a day with food. Qty: 51 each, Refills: 0      CONTINUE these medications which have NOT CHANGED   Details   acetaminophen (TYLENOL) 325 MG tablet Take 650 mg by mouth every 6 (six) hours as needed for fever.    allopurinol (ZYLOPRIM) 300 MG tablet Take 300 mg by mouth daily.    Cholecalciferol (VITAMIN D) 2000 UNITS CAPS Take 1 capsule by mouth daily.    simvastatin (ZOCOR) 20 MG tablet Take 20 mg by mouth every other day.    venlafaxine XR (EFFEXOR-XR) 150 MG 24 hr capsule Take 150 mg by mouth daily with breakfast.      STOP taking these medications     ibuprofen (ADVIL,MOTRIN) 200 MG tablet        Allergies  Allergen Reactions  . Sulfa Antibiotics Hives   Follow-up Information    Follow up with Wilson Singer, MD. Schedule an appointment as soon as possible for a visit in 2 weeks.   Specialty:  Internal Medicine   Contact information:   [address] [place] [address] [phone]        The results of significant diagnostics from this hospitalization (including imaging, microbiology, ancillary and laboratory) are listed below for reference.    Significant Diagnostic Studies: Dg Chest 2 View  04/20/2015   CLINICAL DATA:  Lower right-sided chest wall pain with low-grade fever and pleuritic chest pain since yesterday  EXAM: CHEST  2 VIEW  COMPARISON:  None in PACs  FINDINGS: The lungs are borderline hypoinflated. There is bibasilar atelectasis. A trace of pleural fluid is suspected blunting the costophrenic angles laterally. The heart is top-normal in size. The pulmonary vascularity is normal. The trachea is midline. The bony thorax exhibits no acute abnormality.  IMPRESSION: Bibasilar atelectasis and possible tiny pleural effusions. Correlation with patient's clinical and laboratory values is needed. Chest CT scanning may be useful to exclude underlying pulmonary embolism.   Electronically Signed   By: David  [place] M.D.   On: 04/20/2015 11:03   Ct Angio Chest Pe W/cm &/or Wo Cm  04/21/2015   CLINICAL DATA:  Short of breath.  Right chest pain  EXAM: CT ANGIOGRAPHY CHEST WITH  CONTRAST  TECHNIQUE: Multidetector CT imaging of the chest was performed using the standard protocol during bolus administration of intravenous contrast. Multiplanar CT image reconstructions and MIPs were obtained to evaluate the vascular anatomy.  CONTRAST:  OMNIPAQUE IOHEXOL 350 MG/ML SOLN  COMPARISON:  Chest x-ray 04/20/2015  FINDINGS: Bilateral pulmonary pulmonary emboli in the lower lobes bilaterally. Small amount of clot in the left upper lobe. Moderate clot burden. Pulmonary arteries normal in caliber. RV/ LV ratio 1.4 suggesting mild right heart strain.  Thoracic aorta normal in caliber. No dissection. Negative for pericardial effusion.  Elevated right hemidiaphragm. Small right pleural effusion. Bibasilar atelectasis right greater than left.  Negative for mass or adenopathy.  COPD with mild apical emphysema. Mild mosaic density in the upper lobes bilaterally may be related to mild edema.  Calcification in the dome of the liver on the right compatible with granulomatous chronic infection.  No acute skeletal abnormality.  Review of the MIP images confirms the above findings.  IMPRESSION: Moderate bilateral pulmonary emboli. Mild right heart strain with artery/ LV ratio 1.4  Mild COPD  Small right effusion.  Mild bibasilar atelectasis.  Critical Value/emergent results were called by telephone at the time of interpretation on 04/21/2015 at 9:02 am to Dr. Tilden Fossa , who verbally acknowledged these results.   Electronically Signed   By: Marlan Palau M.D.   On: 04/21/2015 09:04    Microbiology: No results found for this or any previous visit (from the past 240 hour(s)).   Labs: Basic Metabolic Panel:  Recent Labs Lab 04/21/15 0739 04/22/15 0537  NA 139 135  K 4.5 3.9  CL 104 102  CO2 24 25  GLUCOSE 136* 108*  BUN 14 11  CREATININE 1.03 0.86  CALCIUM 10.8* 10.1   Liver Function Tests:  Recent Labs Lab 04/21/15 0739  AST 21  ALT 14*  ALKPHOS 60  BILITOT 1.5*  PROT 8.2*   ALBUMIN 4.4   No results for input(s): LIPASE, AMYLASE in the last 168 hours. No results for input(s): AMMONIA in the last 168 hours. CBC:  Recent Labs Lab 04/21/15 0739 04/22/15 0537  WBC 12.9* 11.8*  NEUTROABS 10.4*  --   HGB 16.3 15.3  HCT 47.5 45.7  MCV 98.3 98.9  PLT 140* 161   Cardiac Enzymes: No results for input(s): CKTOTAL, CKMB, CKMBINDEX, TROPONINI in the last 168 hours. BNP: BNP (last 3 results) No results for input(s): BNP in the last 8760 hours.  ProBNP (last 3 results) No results for input(s): PROBNP in the last 8760 hours.  CBG: No results for input(s): GLUCAP in the last 168 hours.     Signed:  Peggye Pitt, MD   [facility] 04/22/2015, 10:27 AM   I, Princella Pellegrini. Jari Pigg, acting as scribe, recorded this note contemporaneously in the presence of Dr. Chaya Jan, M.D. on 04/22/2015.   I have reviewed the above documentation for accuracy and completeness, and I agree with the above.  Peggye Pitt, MD [facility] Pager: [phone]

## 2015-05-05 DIAGNOSIS — E291 Testicular hypofunction: Secondary | ICD-10-CM | POA: Diagnosis not present

## 2015-05-05 DIAGNOSIS — R972 Elevated prostate specific antigen [PSA]: Secondary | ICD-10-CM | POA: Diagnosis not present

## 2015-05-05 DIAGNOSIS — I10 Essential (primary) hypertension: Secondary | ICD-10-CM | POA: Diagnosis not present

## 2015-05-05 DIAGNOSIS — R071 Chest pain on breathing: Secondary | ICD-10-CM | POA: Diagnosis not present

## 2015-06-12 ENCOUNTER — Emergency Department (HOSPITAL_COMMUNITY)
Admission: EM | Admit: 2015-06-12 | Discharge: 2015-06-12 | Disposition: A | Discharge status: Home / Self Care | Payer: Medicare Other | Attending: Emergency Medicine | Admitting: Emergency Medicine

## 2015-06-12 ENCOUNTER — Encounter (HOSPITAL_COMMUNITY): Payer: Self-pay | Admitting: *Deleted

## 2015-06-12 DIAGNOSIS — Z7901 Long term (current) use of anticoagulants: Secondary | ICD-10-CM | POA: Insufficient documentation

## 2015-06-12 DIAGNOSIS — N400 Enlarged prostate without lower urinary tract symptoms: Secondary | ICD-10-CM | POA: Diagnosis not present

## 2015-06-12 DIAGNOSIS — R319 Hematuria, unspecified: Secondary | ICD-10-CM | POA: Diagnosis not present

## 2015-06-12 DIAGNOSIS — I10 Essential (primary) hypertension: Secondary | ICD-10-CM | POA: Insufficient documentation

## 2015-06-12 DIAGNOSIS — R300 Dysuria: Secondary | ICD-10-CM

## 2015-06-12 DIAGNOSIS — Z79899 Other long term (current) drug therapy: Secondary | ICD-10-CM | POA: Diagnosis not present

## 2015-06-12 DIAGNOSIS — R339 Retention of urine, unspecified: Secondary | ICD-10-CM | POA: Diagnosis present

## 2015-06-12 DIAGNOSIS — Z86711 Personal history of pulmonary embolism: Secondary | ICD-10-CM | POA: Insufficient documentation

## 2015-06-12 DIAGNOSIS — M109 Gout, unspecified: Secondary | ICD-10-CM | POA: Insufficient documentation

## 2015-06-12 DIAGNOSIS — E7800 Pure hypercholesterolemia, unspecified: Secondary | ICD-10-CM | POA: Insufficient documentation

## 2015-06-12 DIAGNOSIS — Z87891 Personal history of nicotine dependence: Secondary | ICD-10-CM | POA: Diagnosis not present

## 2015-06-12 HISTORY — DX: Other pulmonary embolism without acute cor pulmonale: I26.99

## 2015-06-12 LAB — URINALYSIS, ROUTINE W REFLEX MICROSCOPIC
BILIRUBIN URINE: NEGATIVE
Glucose, UA: NEGATIVE mg/dL
KETONES UR: NEGATIVE mg/dL
Leukocytes, UA: NEGATIVE
Nitrite: NEGATIVE
PROTEIN: NEGATIVE mg/dL
Specific Gravity, Urine: 1.01 (ref 1.005–1.030)
UROBILINOGEN UA: 0.2 mg/dL (ref 0.0–1.0)
pH: 6 (ref 5.0–8.0)

## 2015-06-12 LAB — URINE MICROSCOPIC-ADD ON

## 2015-06-12 MED ORDER — CIPROFLOXACIN HCL 500 MG PO TABS: 500.0000 mg | ORAL_TABLET | Freq: Two times a day (BID) | ORAL | Status: DC

## 2015-06-12 MED ORDER — PHENAZOPYRIDINE HCL 100 MG PO TABS
200.0000 mg | ORAL_TABLET | Freq: Once | ORAL | Status: AC
  Administered 2015-06-12: 200 mg via ORAL
  Filled 2015-06-12: qty 2

## 2015-06-12 MED ORDER — PHENAZOPYRIDINE HCL 200 MG PO TABS: 200.0000 mg | ORAL_TABLET | Freq: Three times a day (TID) | ORAL | Status: DC

## 2015-06-12 MED ORDER — CIPROFLOXACIN HCL 250 MG PO TABS
500.0000 mg | ORAL_TABLET | Freq: Once | ORAL | Status: AC
  Administered 2015-06-12: 500 mg via ORAL
  Filled 2015-06-12: qty 2

## 2015-06-12 NOTE — ED Notes (Signed)
Pt states he has hx of BPH but has had trouble urinating starting yesterday with worsening into the night. NAD noted. Pt states he is able to relieve himself but has to strain.

## 2015-06-12 NOTE — Discharge Instructions (Signed)
Dysuria Dysuria is the medical term for pain with urination. There are many causes for dysuria, but urinary tract infection is the most common. If a urinalysis was performed it can show that there is a urinary tract infection. A urine culture confirms that you or your child is sick. You will need to follow up with a healthcare provider because:  If a urine culture was done you will need to know the culture results and treatment recommendations.  If the urine culture was positive, you or your child will need to be put on antibiotics or know if the antibiotics prescribed are the right antibiotics for your urinary tract infection.  If the urine culture is negative (no urinary tract infection), then other causes may need to be explored or antibiotics need to be stopped. Today laboratory work may have been done and there does not seem to be an infection. If cultures were done they will take at least 24 to 48 hours to be completed. Today x-rays may have been taken and they read as normal. No cause can be found for the problems. The x-rays may be re-read by a radiologist and you will be contacted if additional findings are made. You or your child may have been put on medications to help with this problem until you can see your primary caregiver. If the problems get better, see your primary caregiver if the problems return. If you were given antibiotics (medications which kill germs), take all of the mediations as directed for the full course of treatment.  If laboratory work was done, you need to find the results. Leave a telephone number where you can be reached. If this is not possible, make sure you find out how you are to get test results. HOME CARE INSTRUCTIONS   Drink lots of fluids. For adults, drink eight, 8 ounce glasses of clear juice or water a day. For children, replace fluids as suggested by your caregiver.  Empty the bladder often. Avoid holding urine for long periods of time.  After a bowel  movement, women should cleanse front to back, using each tissue only once.  Empty your bladder before and after sexual intercourse.  Take all the medicine given to you until it is gone. You may feel better in a few days, but TAKE ALL MEDICINE.  Avoid caffeine, tea, alcohol and carbonated beverages, because they tend to irritate the bladder.  In men, alcohol may irritate the prostate.  Only take over-the-counter or prescription medicines for pain, discomfort, or fever as directed by your caregiver.  If your caregiver has given you a follow-up appointment, it is very important to keep that appointment. Not keeping the appointment could result in a chronic or permanent injury, pain, and disability. If there is any problem keeping the appointment, you must call back to this facility for assistance. SEEK IMMEDIATE MEDICAL CARE IF:   Back pain develops.  A fever develops.  There is nausea (feeling sick to your stomach) or vomiting (throwing up).  Problems are no better with medications or are getting worse. MAKE SURE YOU:   Understand these instructions.  Will watch your condition.  Will get help right away if you are not doing well or get worse. Document Released: 05/26/2004 Document Revised: 11/20/2011 Document Reviewed: 04/02/2008 [facility] Patient Information 2015 [place], [place]. This information is not intended to replace advice given to you by your health care provider. Make sure you discuss any questions you have with your health care provider.  Hematuria Hematuria is  blood in your urine. It can be caused by a bladder infection, kidney infection, prostate infection, kidney stone, or cancer of your urinary tract. Infections can usually be treated with medicine, and a kidney stone usually will pass through your urine. If neither of these is the cause of your hematuria, further workup to find out the reason may be needed. °It is very important that you tell your health care provider  about any blood you see in your urine, even if the blood stops without treatment or happens without causing pain. Blood in your urine that happens and then stops and then happens again can be a symptom of a very serious condition. Also, pain is not a symptom in the initial stages of many urinary cancers. °HOME CARE INSTRUCTIONS  °· Drink lots of fluid, 3-4 quarts a day. If you have been diagnosed with an infection, cranberry juice is especially recommended, in addition to large amounts of water. °· Avoid caffeine, tea, and carbonated beverages because they tend to irritate the bladder. °· Avoid alcohol because it may irritate the prostate. °· Take all medicines as directed by your health care provider. °· If you were prescribed an antibiotic medicine, finish it all even if you start to feel better. °· If you have been diagnosed with a kidney stone, follow your health care provider's instructions regarding straining your urine to catch the stone. °· Empty your bladder often. Avoid holding urine for long periods of time. °· After a bowel movement, women should cleanse front to back. Use each tissue only once. °· Empty your bladder before and after sexual intercourse if you are a male. °SEEK MEDICAL CARE IF: °· You develop back pain. °· You have a fever. °· You have a feeling of sickness in your stomach (nausea) or vomiting. °· Your symptoms are not better in 3 days. Return sooner if you are getting worse. °SEEK IMMEDIATE MEDICAL CARE IF:  °· You develop severe vomiting and are unable to keep the medicine down. °· You develop severe back or abdominal pain despite taking your medicines. °· You begin passing a large amount of blood or clots in your urine. °· You feel extremely weak or faint, or you pass out. °MAKE SURE YOU:  °· Understand these instructions. °· Will watch your condition. °· Will get help right away if you are not doing well or get worse. °Document Released: 08/28/2005 Document Revised: 01/12/2014  Document Reviewed: 04/28/2013 °[employer]® Patient Information ©2015 [employer]. This information is not intended to replace advice given to you by your health care provider. Make sure you discuss any questions you have with your health care provider. ° °

## 2015-06-12 NOTE — ED Provider Notes (Signed)
CSN: [id number]     Arrival date & time 06/12/15  2725 History   First MD Initiated Contact with Patient 06/12/15 0845     Chief Complaint  Patient presents with  . Urinary Retention     (Consider location/radiation/quality/duration/timing/severity/associated sxs/prior Treatment) The history is provided by the patient.   Billy Hunt is a 69 y.o. male with a hisotry significant for bph and prior history of prostatitis presenting with a 24 hour history of increased urinary frequency, burning pain with urination, but feels he is completely emptying his bladder when he urinates. His urine has been slightly more cloudy than normal.  He denies hematuria, also denies fevers, chills, back or flank pain, nausea or vomiting.  He has had no medicines prior to arrival for this problem but has increased his water intake.     Past Medical History  Diagnosis Date  . Hypertension   . BPH (benign prostatic hyperplasia)   . Gout   . Hypercholesterolemia   . Pulmonary emboli (HCC)    History reviewed. No pertinent past surgical history. Family History  Problem Relation Age of Onset  . Alzheimer's disease Mother   . Cancer Father   . Deep vein thrombosis Brother    Social History  Substance Use Topics  . Smoking status: Former Smoker    Quit date: 04/21/1971  . Smokeless tobacco: None  . Alcohol Use: No     Comment: former    Review of Systems  Constitutional: Negative for fever.  HENT: Negative for congestion and sore throat.   Eyes: Negative.   Respiratory: Negative for chest tightness and shortness of breath.   Cardiovascular: Negative for chest pain.  Gastrointestinal: Negative for nausea and abdominal pain.  Genitourinary: Positive for dysuria, urgency and frequency. Negative for hematuria, flank pain and discharge.  Musculoskeletal: Negative for joint swelling, arthralgias and neck pain.  Skin: Negative.  Negative for rash and wound.  Neurological: Negative for dizziness,  weakness, light-headedness, numbness and headaches.  Psychiatric/Behavioral: Negative.       Allergies  Sulfa antibiotics  Home Medications   Prior to Admission medications   Medication Sig Start Date End Date Taking? Authorizing Provider  acetaminophen (TYLENOL) 325 MG tablet Take 650 mg by mouth every 6 (six) hours as needed for fever.    Historical Provider, MD  allopurinol (ZYLOPRIM) 300 MG tablet Take 300 mg by mouth daily.    Historical Provider, MD  Cholecalciferol (VITAMIN D) 2000 UNITS CAPS Take 1 capsule by mouth daily.    Historical Provider, MD  ciprofloxacin (CIPRO) 500 MG tablet Take 1 tablet (500 mg total) by mouth 2 (two) times daily. 06/12/15   Burgess Amor, PA-C  oxyCODONE (OXY IR/ROXICODONE) 5 MG immediate release tablet Take 1 tablet (5 mg total) by mouth every 4 (four) hours as needed for moderate pain. 04/22/15   Henderson Cloud, MD  phenazopyridine (PYRIDIUM) 200 MG tablet Take 1 tablet (200 mg total) by mouth 3 (three) times daily. 06/12/15   Burgess Amor, PA-C  Rivaroxaban (XARELTO STARTER PACK) 15 & 20 MG TBPK Take as directed on package: Start with one  tablet by mouth twice a day with food. On Day 22, switch to one  tablet once a day with food. 04/22/15   Henderson Cloud, MD  simvastatin (ZOCOR) 20 MG tablet Take 20 mg by mouth every other day.    Historical Provider, MD  venlafaxine XR (EFFEXOR-XR) 150 MG 24 hr capsule Take 150 mg by mouth daily  with breakfast.    Historical Provider, MD   BP 118/77 mmHg  Pulse 60  Temp(Src) 97.7 F (36.5 C) (Oral)  Resp 18  Ht  (1.753 m)  Wt 185 lb (83.915 kg)  BMI 27.31 kg/m2  SpO2 99% Physical Exam  Constitutional: He appears well-developed and well-nourished.  HENT:  Head: Normocephalic and atraumatic.  Eyes: Conjunctivae are normal.  Neck: Normal range of motion.  Cardiovascular: Normal rate, regular rhythm, normal heart sounds and intact distal pulses.   Pulmonary/Chest: Effort normal  and breath sounds normal. He has no wheezes.  Abdominal: Soft. Bowel sounds are normal. There is no tenderness. There is no rebound and no guarding.  No suprapubic tenderness or fullness.  Genitourinary:  Prostate soft, enlarged, nontender.  Musculoskeletal: Normal range of motion.  Neurological: He is alert.  Skin: Skin is warm and dry.  Psychiatric: He has a normal mood and affect.  Nursing note and vitals reviewed.  Chaperone was present during exam.   ED Course  Procedures (including critical care time) Labs Review Labs Reviewed  URINALYSIS, ROUTINE W REFLEX MICROSCOPIC (NOT AT [facility]) - Abnormal; Notable for the following:    APPearance HAZY (*)    Hgb urine dipstick LARGE (*)    All other components within normal limits  URINE CULTURE  URINE MICROSCOPIC-ADD ON    Imaging Review No results found. I have personally reviewed and evaluated these images and lab results as part of my medical decision-making.   EKG Interpretation None      MDM   Final diagnoses:  Dysuria  Hematuria    Pt with hematuria and sx suggesting uti.  No prostate ttp.  He was placed on cipro, pyridium, advised increased fluid intake and f/u with pcp or return here for any worsened sx including fever, nausea, vomiting, urinary retention.  He does not have back or flank pain suggesting ureteral stone (and no prior hx).  Advised repeat UA once abx completed.  Urine cx pending.  Also discussed he may need to see urology if hematuria persists. Advised to discuss with his pcp who can refer if appropriate.  Discussed with Dr. Estell Harpin prior to dc home.  Burgess Amor, PA-C 06/13/15 1428  Burgess Amor, PA-C 06/13/15 1429  Bethann Berkshire, MD 06/14/15 [phone]

## 2015-06-14 LAB — URINE CULTURE: Culture: NO GROWTH

## 2015-06-15 ENCOUNTER — Telehealth (HOSPITAL_COMMUNITY): Payer: Self-pay

## 2015-06-22 DIAGNOSIS — N401 Enlarged prostate with lower urinary tract symptoms: Secondary | ICD-10-CM | POA: Diagnosis not present

## 2015-06-22 DIAGNOSIS — N521 Erectile dysfunction due to diseases classified elsewhere: Secondary | ICD-10-CM | POA: Diagnosis not present

## 2015-06-22 DIAGNOSIS — R350 Frequency of micturition: Secondary | ICD-10-CM | POA: Diagnosis not present

## 2015-06-22 DIAGNOSIS — R972 Elevated prostate specific antigen [PSA]: Secondary | ICD-10-CM | POA: Diagnosis not present

## 2015-06-22 DIAGNOSIS — N529 Male erectile dysfunction, unspecified: Secondary | ICD-10-CM | POA: Insufficient documentation

## 2015-06-22 DIAGNOSIS — N35914 Unspecified anterior urethral stricture, male: Secondary | ICD-10-CM | POA: Insufficient documentation

## 2015-06-22 DIAGNOSIS — R358 Other polyuria: Secondary | ICD-10-CM | POA: Diagnosis not present

## 2015-06-22 DIAGNOSIS — E291 Testicular hypofunction: Secondary | ICD-10-CM | POA: Diagnosis not present

## 2015-06-22 DIAGNOSIS — R338 Other retention of urine: Secondary | ICD-10-CM | POA: Diagnosis not present

## 2015-06-22 DIAGNOSIS — R339 Retention of urine, unspecified: Secondary | ICD-10-CM | POA: Diagnosis not present

## 2015-06-22 DIAGNOSIS — N35013 Post-traumatic anterior urethral stricture: Secondary | ICD-10-CM | POA: Diagnosis not present

## 2015-06-22 DIAGNOSIS — R351 Nocturia: Secondary | ICD-10-CM | POA: Diagnosis not present

## 2015-07-14 DIAGNOSIS — Z1321 Encounter for screening for nutritional disorder: Secondary | ICD-10-CM | POA: Diagnosis not present

## 2015-07-14 DIAGNOSIS — E785 Hyperlipidemia, unspecified: Secondary | ICD-10-CM | POA: Diagnosis not present

## 2015-07-14 DIAGNOSIS — I10 Essential (primary) hypertension: Secondary | ICD-10-CM | POA: Diagnosis not present

## 2015-07-14 DIAGNOSIS — D689 Coagulation defect, unspecified: Secondary | ICD-10-CM | POA: Diagnosis not present

## 2015-07-14 DIAGNOSIS — Z125 Encounter for screening for malignant neoplasm of prostate: Secondary | ICD-10-CM | POA: Diagnosis not present

## 2015-07-14 DIAGNOSIS — I2699 Other pulmonary embolism without acute cor pulmonale: Secondary | ICD-10-CM | POA: Diagnosis not present

## 2015-07-14 DIAGNOSIS — N400 Enlarged prostate without lower urinary tract symptoms: Secondary | ICD-10-CM | POA: Diagnosis not present

## 2015-07-14 DIAGNOSIS — E559 Vitamin D deficiency, unspecified: Secondary | ICD-10-CM | POA: Diagnosis not present

## 2015-07-16 DIAGNOSIS — R339 Retention of urine, unspecified: Secondary | ICD-10-CM | POA: Diagnosis not present

## 2015-07-16 DIAGNOSIS — R350 Frequency of micturition: Secondary | ICD-10-CM | POA: Diagnosis not present

## 2015-07-16 DIAGNOSIS — R351 Nocturia: Secondary | ICD-10-CM | POA: Diagnosis not present

## 2015-07-16 DIAGNOSIS — N529 Male erectile dysfunction, unspecified: Secondary | ICD-10-CM | POA: Diagnosis not present

## 2015-07-16 DIAGNOSIS — N359 Urethral stricture, unspecified: Secondary | ICD-10-CM | POA: Diagnosis not present

## 2015-07-16 DIAGNOSIS — N401 Enlarged prostate with lower urinary tract symptoms: Secondary | ICD-10-CM | POA: Diagnosis not present

## 2015-07-16 DIAGNOSIS — R972 Elevated prostate specific antigen [PSA]: Secondary | ICD-10-CM | POA: Diagnosis not present

## 2015-07-16 DIAGNOSIS — N35013 Post-traumatic anterior urethral stricture: Secondary | ICD-10-CM | POA: Diagnosis not present

## 2015-08-16 DIAGNOSIS — Z23 Encounter for immunization: Secondary | ICD-10-CM | POA: Diagnosis not present

## 2015-10-13 DIAGNOSIS — E559 Vitamin D deficiency, unspecified: Secondary | ICD-10-CM | POA: Diagnosis not present

## 2015-10-13 DIAGNOSIS — I10 Essential (primary) hypertension: Secondary | ICD-10-CM | POA: Diagnosis not present

## 2015-10-13 DIAGNOSIS — I2699 Other pulmonary embolism without acute cor pulmonale: Secondary | ICD-10-CM | POA: Diagnosis not present

## 2015-10-13 DIAGNOSIS — E785 Hyperlipidemia, unspecified: Secondary | ICD-10-CM | POA: Diagnosis not present

## 2015-10-13 DIAGNOSIS — R972 Elevated prostate specific antigen [PSA]: Secondary | ICD-10-CM | POA: Diagnosis not present

## 2015-11-04 DIAGNOSIS — R351 Nocturia: Secondary | ICD-10-CM | POA: Diagnosis not present

## 2015-11-04 DIAGNOSIS — R339 Retention of urine, unspecified: Secondary | ICD-10-CM | POA: Diagnosis not present

## 2015-11-04 DIAGNOSIS — N529 Male erectile dysfunction, unspecified: Secondary | ICD-10-CM | POA: Diagnosis not present

## 2015-11-04 DIAGNOSIS — R350 Frequency of micturition: Secondary | ICD-10-CM | POA: Diagnosis not present

## 2015-11-04 DIAGNOSIS — N359 Urethral stricture, unspecified: Secondary | ICD-10-CM | POA: Diagnosis not present

## 2015-11-04 DIAGNOSIS — N401 Enlarged prostate with lower urinary tract symptoms: Secondary | ICD-10-CM | POA: Diagnosis not present

## 2015-11-04 DIAGNOSIS — E291 Testicular hypofunction: Secondary | ICD-10-CM | POA: Diagnosis not present

## 2015-11-04 DIAGNOSIS — N521 Erectile dysfunction due to diseases classified elsewhere: Secondary | ICD-10-CM | POA: Diagnosis not present

## 2015-11-04 DIAGNOSIS — R972 Elevated prostate specific antigen [PSA]: Secondary | ICD-10-CM | POA: Diagnosis not present

## 2015-11-05 DIAGNOSIS — N529 Male erectile dysfunction, unspecified: Secondary | ICD-10-CM | POA: Diagnosis not present

## 2015-11-05 DIAGNOSIS — R972 Elevated prostate specific antigen [PSA]: Secondary | ICD-10-CM | POA: Diagnosis not present

## 2015-11-05 DIAGNOSIS — R351 Nocturia: Secondary | ICD-10-CM | POA: Diagnosis not present

## 2015-11-05 DIAGNOSIS — N401 Enlarged prostate with lower urinary tract symptoms: Secondary | ICD-10-CM | POA: Diagnosis not present

## 2015-11-05 DIAGNOSIS — R339 Retention of urine, unspecified: Secondary | ICD-10-CM | POA: Diagnosis not present

## 2015-11-05 DIAGNOSIS — R350 Frequency of micturition: Secondary | ICD-10-CM | POA: Diagnosis not present

## 2015-11-05 DIAGNOSIS — N359 Urethral stricture, unspecified: Secondary | ICD-10-CM | POA: Diagnosis not present

## 2015-11-05 DIAGNOSIS — E291 Testicular hypofunction: Secondary | ICD-10-CM | POA: Diagnosis not present

## 2015-11-11 DIAGNOSIS — N529 Male erectile dysfunction, unspecified: Secondary | ICD-10-CM | POA: Diagnosis not present

## 2015-11-11 DIAGNOSIS — R351 Nocturia: Secondary | ICD-10-CM | POA: Diagnosis not present

## 2015-11-11 DIAGNOSIS — R350 Frequency of micturition: Secondary | ICD-10-CM | POA: Diagnosis not present

## 2015-11-11 DIAGNOSIS — N401 Enlarged prostate with lower urinary tract symptoms: Secondary | ICD-10-CM | POA: Diagnosis not present

## 2015-11-11 DIAGNOSIS — E291 Testicular hypofunction: Secondary | ICD-10-CM | POA: Diagnosis not present

## 2015-11-11 DIAGNOSIS — R339 Retention of urine, unspecified: Secondary | ICD-10-CM | POA: Diagnosis not present

## 2015-11-11 DIAGNOSIS — R972 Elevated prostate specific antigen [PSA]: Secondary | ICD-10-CM | POA: Diagnosis not present

## 2015-11-11 DIAGNOSIS — N359 Urethral stricture, unspecified: Secondary | ICD-10-CM | POA: Diagnosis not present

## 2015-11-12 DIAGNOSIS — N200 Calculus of kidney: Secondary | ICD-10-CM | POA: Diagnosis not present

## 2015-11-16 DIAGNOSIS — E559 Vitamin D deficiency, unspecified: Secondary | ICD-10-CM | POA: Diagnosis not present

## 2015-11-16 DIAGNOSIS — I10 Essential (primary) hypertension: Secondary | ICD-10-CM | POA: Diagnosis not present

## 2015-11-16 DIAGNOSIS — E291 Testicular hypofunction: Secondary | ICD-10-CM | POA: Diagnosis not present

## 2015-11-24 DIAGNOSIS — R351 Nocturia: Secondary | ICD-10-CM | POA: Diagnosis not present

## 2015-11-24 DIAGNOSIS — N201 Calculus of ureter: Secondary | ICD-10-CM | POA: Diagnosis not present

## 2015-11-24 DIAGNOSIS — N359 Urethral stricture, unspecified: Secondary | ICD-10-CM | POA: Diagnosis not present

## 2015-11-24 DIAGNOSIS — M7989 Other specified soft tissue disorders: Secondary | ICD-10-CM | POA: Diagnosis not present

## 2015-11-24 DIAGNOSIS — N529 Male erectile dysfunction, unspecified: Secondary | ICD-10-CM | POA: Diagnosis not present

## 2015-11-24 DIAGNOSIS — R339 Retention of urine, unspecified: Secondary | ICD-10-CM | POA: Diagnosis not present

## 2015-11-24 DIAGNOSIS — N3289 Other specified disorders of bladder: Secondary | ICD-10-CM | POA: Diagnosis not present

## 2015-11-24 DIAGNOSIS — Z9852 Vasectomy status: Secondary | ICD-10-CM | POA: Diagnosis not present

## 2015-11-24 DIAGNOSIS — Z79899 Other long term (current) drug therapy: Secondary | ICD-10-CM | POA: Diagnosis not present

## 2015-11-24 DIAGNOSIS — Z882 Allergy status to sulfonamides status: Secondary | ICD-10-CM | POA: Diagnosis not present

## 2015-11-24 DIAGNOSIS — R358 Other polyuria: Secondary | ICD-10-CM | POA: Diagnosis not present

## 2015-11-24 DIAGNOSIS — Z87891 Personal history of nicotine dependence: Secondary | ICD-10-CM | POA: Diagnosis not present

## 2015-11-24 DIAGNOSIS — N400 Enlarged prostate without lower urinary tract symptoms: Secondary | ICD-10-CM | POA: Diagnosis not present

## 2015-11-24 DIAGNOSIS — N210 Calculus in bladder: Secondary | ICD-10-CM | POA: Diagnosis not present

## 2015-11-24 DIAGNOSIS — R350 Frequency of micturition: Secondary | ICD-10-CM | POA: Diagnosis not present

## 2015-11-24 DIAGNOSIS — J449 Chronic obstructive pulmonary disease, unspecified: Secondary | ICD-10-CM | POA: Diagnosis not present

## 2015-11-24 DIAGNOSIS — F419 Anxiety disorder, unspecified: Secondary | ICD-10-CM | POA: Diagnosis not present

## 2015-11-24 DIAGNOSIS — N401 Enlarged prostate with lower urinary tract symptoms: Secondary | ICD-10-CM | POA: Diagnosis not present

## 2015-11-24 DIAGNOSIS — Z86711 Personal history of pulmonary embolism: Secondary | ICD-10-CM | POA: Diagnosis not present

## 2015-11-26 DIAGNOSIS — R972 Elevated prostate specific antigen [PSA]: Secondary | ICD-10-CM | POA: Diagnosis not present

## 2015-11-26 DIAGNOSIS — N401 Enlarged prostate with lower urinary tract symptoms: Secondary | ICD-10-CM | POA: Diagnosis not present

## 2015-11-26 DIAGNOSIS — R351 Nocturia: Secondary | ICD-10-CM | POA: Diagnosis not present

## 2015-11-26 DIAGNOSIS — E291 Testicular hypofunction: Secondary | ICD-10-CM | POA: Diagnosis not present

## 2015-11-26 DIAGNOSIS — R350 Frequency of micturition: Secondary | ICD-10-CM | POA: Diagnosis not present

## 2015-11-26 DIAGNOSIS — R339 Retention of urine, unspecified: Secondary | ICD-10-CM | POA: Diagnosis not present

## 2015-11-26 DIAGNOSIS — N529 Male erectile dysfunction, unspecified: Secondary | ICD-10-CM | POA: Diagnosis not present

## 2015-11-30 DIAGNOSIS — R351 Nocturia: Secondary | ICD-10-CM | POA: Diagnosis not present

## 2015-11-30 DIAGNOSIS — R339 Retention of urine, unspecified: Secondary | ICD-10-CM | POA: Diagnosis not present

## 2015-11-30 DIAGNOSIS — N529 Male erectile dysfunction, unspecified: Secondary | ICD-10-CM | POA: Diagnosis not present

## 2015-11-30 DIAGNOSIS — N401 Enlarged prostate with lower urinary tract symptoms: Secondary | ICD-10-CM | POA: Diagnosis not present

## 2015-11-30 DIAGNOSIS — R350 Frequency of micturition: Secondary | ICD-10-CM | POA: Diagnosis not present

## 2015-11-30 DIAGNOSIS — N359 Urethral stricture, unspecified: Secondary | ICD-10-CM | POA: Diagnosis not present

## 2015-11-30 DIAGNOSIS — E291 Testicular hypofunction: Secondary | ICD-10-CM | POA: Diagnosis not present

## 2015-11-30 DIAGNOSIS — R972 Elevated prostate specific antigen [PSA]: Secondary | ICD-10-CM | POA: Diagnosis not present

## 2016-01-03 DIAGNOSIS — Z86711 Personal history of pulmonary embolism: Secondary | ICD-10-CM | POA: Diagnosis not present

## 2016-01-03 DIAGNOSIS — Z79899 Other long term (current) drug therapy: Secondary | ICD-10-CM | POA: Diagnosis not present

## 2016-01-03 DIAGNOSIS — Z882 Allergy status to sulfonamides status: Secondary | ICD-10-CM | POA: Diagnosis not present

## 2016-01-03 DIAGNOSIS — N400 Enlarged prostate without lower urinary tract symptoms: Secondary | ICD-10-CM | POA: Diagnosis not present

## 2016-01-03 DIAGNOSIS — F419 Anxiety disorder, unspecified: Secondary | ICD-10-CM | POA: Diagnosis not present

## 2016-01-03 DIAGNOSIS — N401 Enlarged prostate with lower urinary tract symptoms: Secondary | ICD-10-CM | POA: Diagnosis not present

## 2016-01-03 DIAGNOSIS — R3916 Straining to void: Secondary | ICD-10-CM | POA: Diagnosis not present

## 2016-01-03 DIAGNOSIS — Z87891 Personal history of nicotine dependence: Secondary | ICD-10-CM | POA: Diagnosis not present

## 2016-01-03 DIAGNOSIS — N411 Chronic prostatitis: Secondary | ICD-10-CM | POA: Diagnosis not present

## 2016-01-03 DIAGNOSIS — N320 Bladder-neck obstruction: Secondary | ICD-10-CM | POA: Diagnosis not present

## 2016-01-04 DIAGNOSIS — Z882 Allergy status to sulfonamides status: Secondary | ICD-10-CM | POA: Diagnosis not present

## 2016-01-04 DIAGNOSIS — F419 Anxiety disorder, unspecified: Secondary | ICD-10-CM | POA: Diagnosis not present

## 2016-01-04 DIAGNOSIS — N320 Bladder-neck obstruction: Secondary | ICD-10-CM | POA: Diagnosis not present

## 2016-01-04 DIAGNOSIS — R3916 Straining to void: Secondary | ICD-10-CM | POA: Diagnosis not present

## 2016-01-04 DIAGNOSIS — N401 Enlarged prostate with lower urinary tract symptoms: Secondary | ICD-10-CM | POA: Diagnosis not present

## 2016-01-04 DIAGNOSIS — N411 Chronic prostatitis: Secondary | ICD-10-CM | POA: Diagnosis not present

## 2016-01-07 DIAGNOSIS — R339 Retention of urine, unspecified: Secondary | ICD-10-CM | POA: Diagnosis not present

## 2016-04-11 DIAGNOSIS — R350 Frequency of micturition: Secondary | ICD-10-CM | POA: Diagnosis not present

## 2016-04-11 DIAGNOSIS — N529 Male erectile dysfunction, unspecified: Secondary | ICD-10-CM | POA: Diagnosis not present

## 2016-04-11 DIAGNOSIS — R351 Nocturia: Secondary | ICD-10-CM | POA: Diagnosis not present

## 2016-04-11 DIAGNOSIS — R972 Elevated prostate specific antigen [PSA]: Secondary | ICD-10-CM | POA: Diagnosis not present

## 2016-04-11 DIAGNOSIS — N401 Enlarged prostate with lower urinary tract symptoms: Secondary | ICD-10-CM | POA: Diagnosis not present

## 2016-04-11 DIAGNOSIS — N359 Urethral stricture, unspecified: Secondary | ICD-10-CM | POA: Diagnosis not present

## 2016-04-11 DIAGNOSIS — E291 Testicular hypofunction: Secondary | ICD-10-CM | POA: Diagnosis not present

## 2016-04-11 DIAGNOSIS — R339 Retention of urine, unspecified: Secondary | ICD-10-CM | POA: Diagnosis not present

## 2016-07-06 DIAGNOSIS — E559 Vitamin D deficiency, unspecified: Secondary | ICD-10-CM | POA: Diagnosis not present

## 2016-07-06 DIAGNOSIS — R972 Elevated prostate specific antigen [PSA]: Secondary | ICD-10-CM | POA: Diagnosis not present

## 2016-07-06 DIAGNOSIS — Z0000 Encounter for general adult medical examination without abnormal findings: Secondary | ICD-10-CM | POA: Diagnosis not present

## 2016-07-06 DIAGNOSIS — I10 Essential (primary) hypertension: Secondary | ICD-10-CM | POA: Diagnosis not present

## 2016-07-06 DIAGNOSIS — Z23 Encounter for immunization: Secondary | ICD-10-CM | POA: Diagnosis not present

## 2016-07-06 DIAGNOSIS — E785 Hyperlipidemia, unspecified: Secondary | ICD-10-CM | POA: Diagnosis not present

## 2016-07-06 DIAGNOSIS — Z0389 Encounter for observation for other suspected diseases and conditions ruled out: Secondary | ICD-10-CM | POA: Diagnosis not present

## 2016-11-06 DIAGNOSIS — E785 Hyperlipidemia, unspecified: Secondary | ICD-10-CM | POA: Diagnosis not present

## 2016-11-06 DIAGNOSIS — I10 Essential (primary) hypertension: Secondary | ICD-10-CM | POA: Diagnosis not present

## 2016-11-06 DIAGNOSIS — R972 Elevated prostate specific antigen [PSA]: Secondary | ICD-10-CM | POA: Diagnosis not present

## 2016-11-06 DIAGNOSIS — E559 Vitamin D deficiency, unspecified: Secondary | ICD-10-CM | POA: Diagnosis not present

## 2017-02-15 DIAGNOSIS — E559 Vitamin D deficiency, unspecified: Secondary | ICD-10-CM | POA: Diagnosis not present

## 2017-02-15 DIAGNOSIS — I10 Essential (primary) hypertension: Secondary | ICD-10-CM | POA: Diagnosis not present

## 2017-02-15 DIAGNOSIS — E785 Hyperlipidemia, unspecified: Secondary | ICD-10-CM | POA: Diagnosis not present

## 2017-07-16 DIAGNOSIS — I10 Essential (primary) hypertension: Secondary | ICD-10-CM | POA: Diagnosis not present

## 2017-07-16 DIAGNOSIS — E785 Hyperlipidemia, unspecified: Secondary | ICD-10-CM | POA: Diagnosis not present

## 2017-07-16 DIAGNOSIS — E559 Vitamin D deficiency, unspecified: Secondary | ICD-10-CM | POA: Diagnosis not present

## 2017-07-16 DIAGNOSIS — Z0389 Encounter for observation for other suspected diseases and conditions ruled out: Secondary | ICD-10-CM | POA: Diagnosis not present

## 2017-07-16 DIAGNOSIS — N400 Enlarged prostate without lower urinary tract symptoms: Secondary | ICD-10-CM | POA: Diagnosis not present

## 2017-07-16 DIAGNOSIS — E291 Testicular hypofunction: Secondary | ICD-10-CM | POA: Diagnosis not present

## 2017-07-16 DIAGNOSIS — H6120 Impacted cerumen, unspecified ear: Secondary | ICD-10-CM | POA: Diagnosis not present

## 2017-07-16 DIAGNOSIS — Z23 Encounter for immunization: Secondary | ICD-10-CM | POA: Diagnosis not present

## 2017-07-26 DIAGNOSIS — E785 Hyperlipidemia, unspecified: Secondary | ICD-10-CM | POA: Diagnosis not present

## 2017-07-26 DIAGNOSIS — I10 Essential (primary) hypertension: Secondary | ICD-10-CM | POA: Diagnosis not present

## 2017-07-27 ENCOUNTER — Emergency Department (HOSPITAL_COMMUNITY)
Admission: EM | Admit: 2017-07-27 | Discharge: 2017-07-27 | Disposition: A | Discharge status: Home / Self Care | Payer: Medicare Other | Attending: Emergency Medicine | Admitting: Emergency Medicine

## 2017-07-27 ENCOUNTER — Other Ambulatory Visit: Payer: Self-pay

## 2017-07-27 ENCOUNTER — Emergency Department (HOSPITAL_COMMUNITY): Payer: Medicare Other

## 2017-07-27 ENCOUNTER — Encounter (HOSPITAL_COMMUNITY): Payer: Self-pay

## 2017-07-27 DIAGNOSIS — I10 Essential (primary) hypertension: Secondary | ICD-10-CM | POA: Diagnosis not present

## 2017-07-27 DIAGNOSIS — R0789 Other chest pain: Secondary | ICD-10-CM | POA: Diagnosis not present

## 2017-07-27 DIAGNOSIS — J189 Pneumonia, unspecified organism: Secondary | ICD-10-CM | POA: Diagnosis not present

## 2017-07-27 DIAGNOSIS — Z7982 Long term (current) use of aspirin: Secondary | ICD-10-CM | POA: Insufficient documentation

## 2017-07-27 DIAGNOSIS — R071 Chest pain on breathing: Secondary | ICD-10-CM | POA: Diagnosis not present

## 2017-07-27 DIAGNOSIS — R0781 Pleurodynia: Secondary | ICD-10-CM | POA: Diagnosis not present

## 2017-07-27 DIAGNOSIS — Z79899 Other long term (current) drug therapy: Secondary | ICD-10-CM | POA: Insufficient documentation

## 2017-07-27 DIAGNOSIS — Z87891 Personal history of nicotine dependence: Secondary | ICD-10-CM | POA: Diagnosis not present

## 2017-07-27 DIAGNOSIS — E7800 Pure hypercholesterolemia, unspecified: Secondary | ICD-10-CM | POA: Insufficient documentation

## 2017-07-27 DIAGNOSIS — R079 Chest pain, unspecified: Secondary | ICD-10-CM | POA: Diagnosis not present

## 2017-07-27 DIAGNOSIS — Z86718 Personal history of other venous thrombosis and embolism: Secondary | ICD-10-CM | POA: Diagnosis not present

## 2017-07-27 LAB — COMPREHENSIVE METABOLIC PANEL
ALBUMIN: 4.4 g/dL (ref 3.5–5.0)
ALK PHOS: 66 U/L (ref 38–126)
ALT: 17 U/L (ref 17–63)
ANION GAP: 8 (ref 5–15)
AST: 25 U/L (ref 15–41)
BILIRUBIN TOTAL: 0.6 mg/dL (ref 0.3–1.2)
BUN: 20 mg/dL (ref 6–20)
CHLORIDE: 106 mmol/L (ref 101–111)
CO2: 24 mmol/L (ref 22–32)
Calcium: 10.2 mg/dL (ref 8.9–10.3)
Creatinine, Ser: 0.95 mg/dL (ref 0.61–1.24)
GFR calc Af Amer: >60 mL/min (ref >60)
GFR calc non Af Amer: >60 mL/min (ref >60)
Glucose, Bld: 111 mg/dL — ABNORMAL HIGH (ref 65–99)
POTASSIUM: 3.7 mmol/L (ref 3.5–5.1)
Sodium: 138 mmol/L (ref 135–145)
TOTAL PROTEIN: 7.2 g/dL (ref 6.5–8.1)

## 2017-07-27 LAB — CBC WITH DIFFERENTIAL/PLATELET
BASOS ABS: 0.1 10*3/uL (ref 0.0–0.1)
Basophils Relative: 1 %
EOS PCT: 4 %
Eosinophils Absolute: 0.3 10*3/uL (ref 0.0–0.7)
HEMATOCRIT: 41.6 % (ref 39.0–52.0)
HEMOGLOBIN: 14.6 g/dL (ref 13.0–17.0)
LYMPHS ABS: 2.8 10*3/uL (ref 0.7–4.0)
LYMPHS PCT: 37 %
MCH: 32.7 pg (ref 26.0–34.0)
MCHC: 35.1 g/dL (ref 30.0–36.0)
MCV: 93.1 fL (ref 78.0–100.0)
Monocytes Absolute: 0.6 10*3/uL (ref 0.1–1.0)
Monocytes Relative: 8 %
NEUTROS ABS: 3.8 10*3/uL (ref 1.7–7.7)
NEUTROS PCT: 50 %
Platelets: 178 10*3/uL (ref 150–400)
RBC: 4.47 MIL/uL (ref 4.22–5.81)
RDW: 12.5 % (ref 11.5–15.5)
WBC: 7.5 10*3/uL (ref 4.0–10.5)

## 2017-07-27 MED ORDER — AZITHROMYCIN 250 MG PO TABS: ORAL_TABLET | ORAL | 0 refills | Status: DC

## 2017-07-27 MED ORDER — AMOXICILLIN 250 MG PO CAPS
500.0000 mg | ORAL_CAPSULE | Freq: Once | ORAL | Status: AC
  Administered 2017-07-27: 500 mg via ORAL
  Filled 2017-07-27: qty 2

## 2017-07-27 MED ORDER — NAPROXEN 250 MG PO TABS: ORAL_TABLET | ORAL | 0 refills | Status: DC

## 2017-07-27 MED ORDER — AMOXICILLIN 500 MG PO CAPS: 500.0000 mg | ORAL_CAPSULE | Freq: Three times a day (TID) | ORAL | 0 refills | Status: DC

## 2017-07-27 MED ORDER — CYCLOBENZAPRINE HCL 5 MG PO TABS: 5.0000 mg | ORAL_TABLET | Freq: Three times a day (TID) | ORAL | 0 refills | Status: DC | PRN

## 2017-07-27 MED ORDER — IOPAMIDOL (ISOVUE-370) INJECTION 76%
100.0000 mL | Freq: Once | INTRAVENOUS | Status: AC | PRN
  Administered 2017-07-27: 100 mL via INTRAVENOUS

## 2017-07-27 NOTE — ED Provider Notes (Signed)
[facility] Provider Note   CSN: [id number] Arrival date & time: 07/27/17  0015  Time seen 12:40 AM   History   Chief Complaint Chief Complaint  Patient presents with  . Back Pain    HPI Billy Hunt is a 71 y.o. male.  HPI patient reports yesterday, November 14 he had a large metal 10 foot ladder that he was using to get on the roof his house and states there was one time when he had it shift balance and he twisted to keep it from falling.  He states this morning he woke up and is painful in his right posterior chest.  It hurts with changing positions and big deep breaths.  He denies shortness of breath, fever, or cough.  He states 2 years ago he had DVT in his left leg after a lot of traveling and he did have PE at that time.  He is currently only taking a baby aspirin a day.  He states he continues to have some swelling of that left leg off and on but it is not more swollen than usual.  He was seen by his PCP today and did not mention the discomfort.  He states the pain is similar to when he had the PE but not as intense.  Wife reports he has not been his usual self today.  PCP [name], Nimish C, MD   Past Medical History:  Diagnosis Date  . BPH (benign prostatic hyperplasia)   . Gout   . Hypercholesterolemia   . Hypertension   . Pulmonary emboli [facility](HCC)     Patient Active Problem List   Diagnosis Date Noted  . Pulmonary embolism (HCC) 04/21/2015  . HTN (hypertension) 04/21/2015  . Gout 04/21/2015  . Hyperlipidemia 04/21/2015    Past Surgical History:  Procedure Laterality Date  . TRANSURETHRAL RESECTION OF PROSTATE         Home Medications    Prior to Admission medications   Medication Sig Start Date End Date Taking? Authorizing Provider  acetaminophen (TYLENOL) 325 MG tablet Take 650 mg by mouth every 6 (six) hours as needed for fever.   Yes [provider]  aspirin 81 MG chewable tablet Chew daily by mouth.   Yes [provider]  Cholecalciferol (VITAMIN D) 2000 UNITS CAPS Take 1 capsule by mouth daily.   Yes [provider]  pseudoephedrine-acetaminophen (TYLENOL SINUS) 30-500 MG TABS tablet Take 1 tablet every 4 (four) hours as needed by mouth.   Yes [provider]  simvastatin (ZOCOR) 20 MG tablet Take 20 mg by mouth every other day.   Yes [provider]  tamsulosin (FLOMAX) 0.4 MG CAPS capsule Take 0.4 mg by mouth.   Yes [provider]  venlafaxine XR (EFFEXOR-XR) 150 MG 24 hr capsule Take 150 mg by mouth daily with breakfast.   Yes [provider]  allopurinol (ZYLOPRIM) 300 MG tablet Take 300 mg by mouth daily.    [provider]  amoxicillin (AMOXIL) 500 MG capsule Take 1 capsule (500 mg total) 3 (three) times daily by mouth. 07/27/17   [name], [name], MD  azithromycin (ZITHROMAX Z-PAK) 250 MG tablet Take 2 po the first day then once a day for the next 4 days. 07/27/17   [name], [name], MD  ciprofloxacin (CIPRO) 500 MG tablet Take 1 tablet (500 mg total) by mouth 2 (two) times daily. 06/12/15   Burgess AmorIdol, [name], PA-C  cyclobenzaprine (FLEXERIL) 5 MG tablet Take 1 tablet (5 mg  total) 3 (three) times daily as needed by mouth (muscle pain). 07/27/17   [name], [name], MD  naproxen (NAPROSYN) 250 MG tablet Take 1 po BID with food prn pain 07/27/17   [name], [name], MD  oxyCODONE (OXY IR/ROXICODONE) 5 MG immediate release tablet Take 1 tablet (5 mg total) by mouth every 4 (four) hours as needed for moderate pain. 04/22/15   [name], [name], MD  phenazopyridine (PYRIDIUM) 200 MG tablet Take 1 tablet (200 mg total) by mouth 3 (three) times daily. 06/12/15   Burgess AmorIdol, [name], PA-C    Family History Family History  Problem Relation Age of Onset  . Alzheimer's disease Mother   . Cancer Father   . Deep vein thrombosis Brother     Social History Social History   Tobacco Use  . Smoking status: Former Smoker    Last attempt to quit: 04/21/1971    Years since  quitting: 46.2  . Smokeless tobacco: Never Used  Substance Use Topics  . Alcohol use: No    Comment: former  . Drug use: Yes    Types: Marijuana    Comment: last use: 9/30  retired Lives at home Lives with spouse   Allergies   Sulfa antibiotics   Review of Systems Review of Systems  All other systems reviewed and are negative.    Physical Exam Updated Vital Signs BP (!) 154/64 (BP Location: Right Arm)   Pulse (!) 58   Temp 98.1 F (36.7 C) (Oral)   Resp 18   Ht 5\' 9"  (1.753 m)   Wt 78.9 kg (174 lb)   SpO2 98%   BMI 25.70 kg/m   Physical Exam  Constitutional: He is oriented to person, place, and time. He appears well-developed and well-nourished.  Non-toxic appearance. He does not appear ill. No distress.  HENT:  Head: Normocephalic and atraumatic.  Right Ear: External ear normal.  Left Ear: External ear normal.  Nose: Nose normal. No mucosal edema or rhinorrhea.  Mouth/Throat: Oropharynx is clear and moist and mucous membranes are normal. No dental abscesses or uvula swelling.  Eyes: Conjunctivae and EOM are normal. Pupils are equal, round, and reactive to light.  Neck: Normal range of motion and full passive range of motion without pain. Neck supple.  Cardiovascular: Normal rate, regular rhythm and normal heart sounds. Exam reveals no gallop and no friction rub.  No murmur heard. Pulmonary/Chest: Effort normal. No respiratory distress. He has decreased breath sounds in the right lower field. He has no wheezes. He has no rhonchi. He has no rales.     He exhibits no tenderness and no crepitus.  Area of pain noted, nontender  Abdominal: Soft. Normal appearance and bowel sounds are normal. He exhibits no distension. There is no tenderness. There is no rebound and no guarding.  Musculoskeletal: Normal range of motion. He exhibits no edema or tenderness.  Moves all extremities well.   Neurological: He is alert and oriented to person, place, and time. He has normal  strength. No cranial nerve deficit.  Skin: Skin is warm, dry and intact. No rash noted. No erythema. No pallor.  Psychiatric: He has a normal mood and affect. His speech is normal and behavior is normal. His mood appears not anxious.  Nursing note and vitals reviewed.    ED Treatments / Results  Labs (all labs ordered are listed, but only abnormal results are displayed) Results for orders placed or performed during the hospital encounter of 07/27/17  Comprehensive metabolic panel  Result  Value Ref Range   Sodium 138 135 - 145 mmol/L   Potassium 3.7 3.5 - 5.1 mmol/L   Chloride 106 101 - 111 mmol/L   CO2 24 22 - 32 mmol/L   Glucose, Bld 111 (H) 65 - 99 mg/dL   BUN 20 6 - 20 mg/dL   Creatinine, Ser 1.61 0.61 - 1.24 mg/dL   Calcium 09.6 8.9 - 04.5 mg/dL   Total Protein 7.2 6.5 - 8.1 g/dL   Albumin 4.4 3.5 - 5.0 g/dL   AST 25 15 - 41 U/L   ALT 17 17 - 63 U/L   Alkaline Phosphatase 66 38 - 126 U/L   Total Bilirubin 0.6 0.3 - 1.2 mg/dL   GFR calc non Af Amer >60 >60 mL/min   GFR calc Af Amer >60 >60 mL/min   Anion gap 8 5 - 15  CBC with Differential  Result Value Ref Range   WBC 7.5 4.0 - 10.5 K/uL   RBC 4.47 4.22 - 5.81 MIL/uL   Hemoglobin 14.6 13.0 - 17.0 g/dL   HCT 40.9 81.1 - 91.4 %   MCV 93.1 78.0 - 100.0 fL   MCH 32.7 26.0 - 34.0 pg   MCHC 35.1 30.0 - 36.0 g/dL   RDW 78.2 95.6 - 21.3 %   Platelets 178 150 - 400 K/uL   Neutrophils Relative % 50 %   Neutro Abs 3.8 1.7 - 7.7 K/uL   Lymphocytes Relative 37 %   Lymphs Abs 2.8 0.7 - 4.0 K/uL   Monocytes Relative 8 %   Monocytes Absolute 0.6 0.1 - 1.0 K/uL   Eosinophils Relative 4 %   Eosinophils Absolute 0.3 0.0 - 0.7 K/uL   Basophils Relative 1 %   Basophils Absolute 0.1 0.0 - 0.1 K/uL   Laboratory interpretation all normal except mild hyperglycemia    EKG  EKG Interpretation None       Radiology Ct Angio Chest Pe W/cm &/or Wo Cm  Result Date: 07/27/2017 CLINICAL DATA:  Acute onset of right posterior  shoulder and upper back pain after lifting ladder yesterday. EXAM: CT ANGIOGRAPHY CHEST WITH CONTRAST TECHNIQUE: Multidetector CT imaging of the chest was performed using the standard protocol during bolus administration of intravenous contrast. Multiplanar CT image reconstructions and MIPs were obtained to evaluate the vascular anatomy. CONTRAST:  ISOVUE-370 IOPAMIDOL (ISOVUE-370) INJECTION 76% COMPARISON:  CTA of the chest performed 04/21/2015 FINDINGS: Cardiovascular:  There is no evidence of pulmonary embolus. The heart is borderline normal in size. Scattered calcification is noted along the thoracic aorta. The proximal great vessels are unremarkable in appearance. Mediastinum/Nodes: The mediastinum is unremarkable. No mediastinal lymphadenopathy is seen. No pericardial effusion is identified. The thyroid gland is unremarkable. No axillary lymphadenopathy is seen. Lungs/Pleura: Bilateral emphysema is noted. Mild bibasilar airspace opacities, right greater than left, may reflect atelectasis or mild infection. No pleural effusion or pneumothorax is seen. No dominant mass is identified. Upper Abdomen: The visualized portions of the liver and spleen are unremarkable, aside from calcified granulomata at the hepatic dome. Musculoskeletal: No acute osseous abnormalities are identified. The visualized musculature is unremarkable in appearance. Review of the MIP images confirms the above findings. IMPRESSION: 1. No evidence of pulmonary embolus. 2. Bilateral emphysema. Mild bibasilar airspace opacities, right greater than left, may reflect atelectasis or mild infection. Electronically Signed   By: Roanna Raider M.D.   On: 07/27/2017 02:11    Procedures Procedures (including critical care time)  Medications Ordered in ED Medications  amoxicillin (  AMOXIL) capsule 500 mg (not administered)  iopamidol (ISOVUE-370) 76 % injection 100 mL (100 mLs Intravenous Contrast Given 07/27/17 0145)     Initial  Impression / Assessment and Plan / ED Course  I have reviewed the triage vital signs and the nursing notes.  Pertinent labs & imaging results that were available during my care of the patient were reviewed by me and considered in my medical decision making (see chart for details).    When I look at his prior labs he has not had blood work in our system since 2016.  Blood work was ordered.  I am going to proceed with CT of the chest.  He has a history of DVT/PE and was similar symptoms CT seems the best way to make sure he does not have another PE.  3 AM we discussed his CT results.  Patient does state he has a cough, his wife states she felt like he looked like he was coming down with something today and not his usual self.  He was started on oral antibiotics for possible pneumonia.  He was advised to let his PCP office know about his ED visit because they may want to recheck him next week to see if he is doing better.  Final Clinical Impressions(s) / ED Diagnoses   Final diagnoses:  Posterior chest pain  Pleuritic chest pain  Community acquired pneumonia, unspecified laterality    ED Discharge Orders        Ordered    azithromycin (ZITHROMAX Z-PAK) 250 MG tablet     07/27/17 0300    amoxicillin (AMOXIL) 500 MG capsule  3 times daily     07/27/17 0300    naproxen (NAPROSYN) 250 MG tablet     07/27/17 0300    cyclobenzaprine (FLEXERIL) 5 MG tablet  3 times daily PRN     07/27/17 0300      Plan discharge  Devoria Albe, MD, Concha Pyo, MD 07/27/17 [id number]

## 2017-07-27 NOTE — Discharge Instructions (Signed)
Use ice and heat for comfort. Take the medications as prescribed. Call Dr Patty SermonsGosrani's office to let him know about your ED visit, he will probably want to recheck you in the next couple of weeks. Return to the ED if you get a fever, struggle to breathe or you feel worse.

## 2017-07-27 NOTE — ED Triage Notes (Signed)
Pt c/o pain in right posterior shoulder/shoulder blade area and upper back, states he was lifting a ladder yesterday and not sure if he strained something.  Pain is worse with movement.

## 2017-12-24 DIAGNOSIS — M545 Low back pain: Secondary | ICD-10-CM | POA: Diagnosis not present

## 2017-12-24 DIAGNOSIS — I10 Essential (primary) hypertension: Secondary | ICD-10-CM | POA: Diagnosis not present

## 2017-12-24 DIAGNOSIS — E559 Vitamin D deficiency, unspecified: Secondary | ICD-10-CM | POA: Diagnosis not present

## 2017-12-24 DIAGNOSIS — E785 Hyperlipidemia, unspecified: Secondary | ICD-10-CM | POA: Diagnosis not present

## 2018-07-17 DIAGNOSIS — E785 Hyperlipidemia, unspecified: Secondary | ICD-10-CM | POA: Diagnosis not present

## 2018-07-17 DIAGNOSIS — E559 Vitamin D deficiency, unspecified: Secondary | ICD-10-CM | POA: Diagnosis not present

## 2018-07-17 DIAGNOSIS — I10 Essential (primary) hypertension: Secondary | ICD-10-CM | POA: Diagnosis not present

## 2018-07-17 DIAGNOSIS — N400 Enlarged prostate without lower urinary tract symptoms: Secondary | ICD-10-CM | POA: Diagnosis not present

## 2018-07-17 DIAGNOSIS — Z23 Encounter for immunization: Secondary | ICD-10-CM | POA: Diagnosis not present

## 2018-07-22 ENCOUNTER — Other Ambulatory Visit (HOSPITAL_COMMUNITY): Payer: Self-pay | Admitting: Nurse Practitioner

## 2018-07-22 DIAGNOSIS — Z87891 Personal history of nicotine dependence: Secondary | ICD-10-CM

## 2018-07-30 ENCOUNTER — Ambulatory Visit (HOSPITAL_COMMUNITY)
Admission: RE | Admit: 2018-07-30 | Discharge: 2018-07-30 | Disposition: A | Discharge status: Home / Self Care | Payer: Medicare Other | Source: Ambulatory Visit | Attending: Nurse Practitioner | Admitting: Nurse Practitioner

## 2018-07-30 DIAGNOSIS — Z87891 Personal history of nicotine dependence: Secondary | ICD-10-CM | POA: Insufficient documentation

## 2018-07-30 DIAGNOSIS — Z136 Encounter for screening for cardiovascular disorders: Secondary | ICD-10-CM | POA: Diagnosis not present

## 2018-08-20 DIAGNOSIS — F411 Generalized anxiety disorder: Secondary | ICD-10-CM | POA: Diagnosis not present

## 2018-08-20 DIAGNOSIS — E785 Hyperlipidemia, unspecified: Secondary | ICD-10-CM | POA: Diagnosis not present

## 2018-08-20 DIAGNOSIS — I10 Essential (primary) hypertension: Secondary | ICD-10-CM | POA: Diagnosis not present

## 2018-09-17 DIAGNOSIS — I10 Essential (primary) hypertension: Secondary | ICD-10-CM | POA: Diagnosis not present

## 2018-09-17 DIAGNOSIS — E559 Vitamin D deficiency, unspecified: Secondary | ICD-10-CM | POA: Diagnosis not present

## 2018-09-17 DIAGNOSIS — E785 Hyperlipidemia, unspecified: Secondary | ICD-10-CM | POA: Diagnosis not present

## 2018-09-17 DIAGNOSIS — F411 Generalized anxiety disorder: Secondary | ICD-10-CM | POA: Diagnosis not present

## 2018-09-17 LAB — BASIC METABOLIC PANEL
BUN: 24 — AB (ref 4–21)
Creatinine: 0.9 (ref 0.6–1.3)

## 2018-09-17 LAB — LIPID PANEL
CHOLESTEROL: 240 — AB (ref 0–200)
HDL: 51 (ref 35–70)
LDL Cholesterol: 163
Triglycerides: 134 (ref 40–160)

## 2018-09-23 DIAGNOSIS — H6120 Impacted cerumen, unspecified ear: Secondary | ICD-10-CM | POA: Diagnosis not present

## 2018-09-23 DIAGNOSIS — E785 Hyperlipidemia, unspecified: Secondary | ICD-10-CM | POA: Diagnosis not present

## 2018-09-26 DIAGNOSIS — E213 Hyperparathyroidism, unspecified: Secondary | ICD-10-CM | POA: Diagnosis not present

## 2018-09-26 DIAGNOSIS — H6121 Impacted cerumen, right ear: Secondary | ICD-10-CM | POA: Diagnosis not present

## 2018-11-20 ENCOUNTER — Other Ambulatory Visit: Payer: Self-pay

## 2018-11-20 ENCOUNTER — Encounter: Payer: Self-pay | Admitting: "Endocrinology

## 2018-11-20 ENCOUNTER — Ambulatory Visit (INDEPENDENT_AMBULATORY_CARE_PROVIDER_SITE_OTHER): Payer: Medicare Other | Admitting: "Endocrinology

## 2018-11-20 VITALS — BP 120/72 | HR 72 | Ht 69.0 in | Wt 178.0 lb

## 2018-11-20 DIAGNOSIS — M8580 Other specified disorders of bone density and structure, unspecified site: Secondary | ICD-10-CM

## 2018-11-20 DIAGNOSIS — E210 Primary hyperparathyroidism: Secondary | ICD-10-CM | POA: Diagnosis not present

## 2018-11-20 NOTE — Progress Notes (Signed)
Endocrinology Consult Note       11/20/2018, 3:52 PM  Billy Hunt is a 73 y.o.-year-old male, referred by his  Wilson Singer, MD  , for evaluation for hypercalcemia/hyperparathyroidism.   Past Medical History:  Diagnosis Date  . BPH (benign prostatic hyperplasia)   . Gout   . Hypercholesterolemia   . Hypertension   . Pulmonary emboli [facility])     Past Surgical History:  Procedure Laterality Date  . PERIPHERAL VASCULAR THROMBECTOMY    . TRANSURETHRAL RESECTION OF PROSTATE      Social History   Tobacco Use  . Smoking status: Former Smoker    Last attempt to quit: 04/21/1971    Years since quitting: 47.6  . Smokeless tobacco: Never Used  Substance Use Topics  . Alcohol use: No    Comment: former  . Drug use: Yes    Types: Marijuana    Comment: last use: 9/30    Family History  Problem Relation Age of Onset  . Alzheimer's disease Mother   . Cancer Father   . Deep vein thrombosis Brother     Outpatient Encounter Medications as of 11/20/2018  Medication Sig  . cholecalciferol (VITAMIN D3) 25 MCG (1000 UT) tablet Take 1,000 Units by mouth daily.  . tamsulosin (FLOMAX) 0.4 MG CAPS capsule Take 0.4 mg by mouth daily.  Marland Kitchen venlafaxine (EFFEXOR) 75 MG tablet Take by mouth 2 (two) times daily.  . [DISCONTINUED] acetaminophen (TYLENOL) 325 MG tablet Take 650 mg by mouth every 6 (six) hours as needed for fever.  . [DISCONTINUED] allopurinol (ZYLOPRIM) 300 MG tablet Take 300 mg by mouth daily.  . [DISCONTINUED] amoxicillin (AMOXIL) 500 MG capsule Take 1 capsule (500 mg total) 3 (three) times daily by mouth.  . [DISCONTINUED] aspirin 81 MG chewable tablet Chew daily by mouth.  . [DISCONTINUED] azithromycin (ZITHROMAX Z-PAK) 250 MG tablet Take 2 po the first day then once a day for the next 4 days.  . [DISCONTINUED] Cholecalciferol (VITAMIN D) 2000 UNITS CAPS Take 1 capsule by mouth daily.  . [DISCONTINUED] ciprofloxacin  (CIPRO) 500 MG tablet Take 1 tablet (500 mg total) by mouth 2 (two) times daily.  . [DISCONTINUED] cyclobenzaprine (FLEXERIL) 5 MG tablet Take 1 tablet (5 mg total) 3 (three) times daily as needed by mouth (muscle pain).  . [DISCONTINUED] naproxen (NAPROSYN) 250 MG tablet Take 1 po BID with food prn pain  . [DISCONTINUED] oxyCODONE (OXY IR/ROXICODONE) 5 MG immediate release tablet Take 1 tablet (5 mg total) by mouth every 4 (four) hours as needed for moderate pain.  . [DISCONTINUED] phenazopyridine (PYRIDIUM) 200 MG tablet Take 1 tablet (200 mg total) by mouth 3 (three) times daily.  . [DISCONTINUED] pseudoephedrine-acetaminophen (TYLENOL SINUS) 30-500 MG TABS tablet Take 1 tablet every 4 (four) hours as needed by mouth.  . [DISCONTINUED] simvastatin (ZOCOR) 20 MG tablet Take 20 mg by mouth every other day.  . [DISCONTINUED] tamsulosin (FLOMAX) 0.4 MG CAPS capsule Take 0.4 mg by mouth.  . [DISCONTINUED] venlafaxine XR (EFFEXOR-XR) 150 MG 24 hr capsule Take 150 mg by mouth daily with breakfast.   No facility-administered encounter medications on file as of 11/20/2018.  Allergies  Allergen Reactions  . Sulfa Antibiotics Hives     HPI  Billy Hunt reports that he was diagnosed with hypercalcemia approximately 6 years ago with blood test showing high PTH and high calcium.  He denies any history of nephrolithiasis, seizure disorder, cardiac dysrhythmias.  Denies any history of CKD.   -He however was found to have osteopenia in 2014.  Currently not on any calcium supplements, however on low-dose vitamin D3 4000 units supplement.    -He denies any family history of parathyroid, thyroid, pituitary, adrenal dysfunction.   -Review of hisreferral package of most recent labs reveals calcium of 11.4 mg per DL associated with elevated PTH of 76 pg/mL on September 24, 2018.  Summarized 100 and page of paper he came with shows that his PTH ranged from 89-1 67 since 2014, phosphorus stayed between 2.2-2.4,  urinary 24-hour calcium in June 2014 was 192 mg per 24 hours.   -His previous DEXA scan is not available to review, reported to have shown osteopenia.  -No prior history of fragility fractures or falls.  He is a former  smoker.   he is not on HCTZ or other thiazide therapy.     ROS:  Constitutional: no recent major weight changes, no fatigue, no subjective hyperthermia, no subjective hypothermia Eyes: no blurry vision, no xerophthalmia ENT: no sore throat, no nodules palpated in throat, no dysphagia/odynophagia, no hoarseness Cardiovascular: no Chest Pain, no Shortness of Breath, no palpitations, no leg swelling Respiratory: no cough, no shortness of breath  Gastrointestinal: no Nausea/Vomiting/Diarhhea Musculoskeletal: no muscle/joint aches Skin: no rashes Neurological: no tremors, no numbness, no tingling, no dizziness Psychiatric: no depression, no anxiety  PE: BP 120/72   Pulse 72   Ht  (1.753 m)   Wt 178 lb (80.7 kg)   BMI 26.29 kg/m , Body mass index is 26.29 kg/m. Wt Readings from Last 3 Encounters:  11/20/18 178 lb (80.7 kg)  07/27/17 174 lb (78.9 kg)  06/12/15 185 lb (83.9 kg)    Constitutional:  + Appropriate weight for height, not in acute distress, normal state of mind Eyes: PERRLA, EOMI, no exophthalmos ENT: moist mucous membranes, no gross thyromegaly, no gross cervical lymphadenopathy Cardiovascular: normal precordial activity, Regular Rate and Rhythm, no Murmur/Rubs/Gallops Respiratory:  adequate breathing efforts, no gross chest deformity, Clear to auscultation bilaterally Gastrointestinal: abdomen soft, Non -tender, No distension, Bowel Sounds present Musculoskeletal: no gross deformities, strength intact in all four extremities Skin: moist, warm, no rashes Neurological: no tremor with outstretched hands, Deep tendon reflexes normal in bilateral lower extremities.     CMP ( most recent) CMP     Component Value Date/Time   NA 138 07/27/2017 0100    K 3.7 07/27/2017 0100   CL 106 07/27/2017 0100   CO2 24 07/27/2017 0100   GLUCOSE 111 (H) 07/27/2017 0100   BUN 20 07/27/2017 0100   CREATININE 0.95 07/27/2017 0100   CALCIUM 10.2 07/27/2017 0100   PROT 7.2 07/27/2017 0100   ALBUMIN 4.4 07/27/2017 0100   AST 25 07/27/2017 0100   ALT 17 07/27/2017 0100   ALKPHOS 66 07/27/2017 0100   BILITOT 0.6 07/27/2017 0100   GFRNONAA >60 07/27/2017 0100   GFRAA >60 07/27/2017 0100   Most recent official lab reports from September 17, 2018: 25-hydroxy vitamin D 43, PTH 76 elevated, calcium 11.4 mg per DL elevated, BUN 25, creatinine 0.98. -He came with separate handwritten summaries of his labs from 2014 showing PTH elevated between 89-1 67 between  May and June 2014, 25 hydroxy vitamin D ranging between 8.6-27 between June and August 2014, phosphorus range from 2.2-2.3 between May and August 2014, 24-hour urine calcium 192 mg in 24 hours in June 2014. DEXA scan showed mild osteopenia in June 2014.   Assessment: 1. Hypercalcemia / Hyperparathyroidism  Plan: Patient has had several instances of elevated calcium, with the highest level being at 11.4 mg/dL. A corresponding intact PTH level was also high, at 76 in January 2020.  He has had higher PTH of 167 in June 2014. - Patient also  has had vitamin D deficiency currently on supplement with vitamin D3 4000 units daily, recent 25-hydroxy vitamin D shows replete at 43 ng/mL.   -The apparent complication seems to be mild osteopenia based on DEXA scan in June 2014.  no history of  nephrolithiasis,  Osteoporosis, fragility fractures. No abdominal pain, no major mood disorders, no bone pain.  - I discussed with the patient about the physiology of calcium and parathyroid hormone, and possible  effects of  increased PTH/ Calcium , including kidney stones, cardiac dysrhythmias, osteoporosis, abdominal pain, etc.   -He has mild primary hyperparathyroidism, not sufficient enough for immediate surgical referral.    -He is approached for repeat bone density as well as 24-hour urine calcium measurement.  The studies are important to rule out the rare but important differential of FHH (familial hypocalciuric hypercalcemia). -If he is studies confirm primary hyperparathyroidism, he is a surgical candidate. -He is advised to avoid any calcium supplements, however will continue to benefit from the vitamin D supplement.  It will be lowered to 1000 units daily.   -He is advised to continue his primary care follow-up with Dr. Karilyn Cota.  - Time spent with the patient: 45 minutes, of which >50% was spent in obtaining information about his symptoms, reviewing his previous labs, evaluations, and treatments, counseling him about his hypercalcemia, primary hyperparathyroidism, osteopenia,  vitamin D deficiency, and developing a plan to confirm the diagnosis and long term treatment as necessary.  Please refer to " Patient Self Inventory" in the Media  tab for reviewed elements of pertinent patient history.  Consuello Bossier participated in the discussions, expressed understanding, and voiced agreement with the above plans.  All questions were answered to his satisfaction. he is encouraged to contact clinic should he have any questions or concerns prior to his return visit.  - Return in about 2 weeks (around 12/04/2018) for 24 Hours Urine Calcium and Creatinine, Follow up with Bone Density.   Marquis Lunch, MD [facility] [address] [place], [place] [address] Phone: [phone]  Fax: [phone]    This note was partially dictated with voice recognition software. Similar sounding words can be transcribed inadequately or may not  be corrected upon review.  11/20/2018, 3:52 PM

## 2018-11-22 ENCOUNTER — Other Ambulatory Visit: Payer: Self-pay | Admitting: "Endocrinology

## 2018-11-22 DIAGNOSIS — E210 Primary hyperparathyroidism: Secondary | ICD-10-CM | POA: Diagnosis not present

## 2018-11-23 LAB — CREATININE, URINE, 24 HOUR: CREATININE 24H UR: 1.14 g/(24.h) (ref 0.50–2.15)

## 2018-11-23 LAB — CALCIUM, URINE, 24 HOUR: Calcium, 24H Urine: 221 mg/24 h

## 2018-11-25 ENCOUNTER — Ambulatory Visit (HOSPITAL_COMMUNITY)
Admission: RE | Admit: 2018-11-25 | Discharge: 2018-11-25 | Disposition: A | Discharge status: Home / Self Care | Payer: Medicare Other | Source: Ambulatory Visit | Attending: "Endocrinology | Admitting: "Endocrinology

## 2018-11-25 ENCOUNTER — Other Ambulatory Visit: Payer: Self-pay

## 2018-11-25 DIAGNOSIS — E210 Primary hyperparathyroidism: Secondary | ICD-10-CM

## 2018-11-25 DIAGNOSIS — M85852 Other specified disorders of bone density and structure, left thigh: Secondary | ICD-10-CM | POA: Diagnosis not present

## 2018-12-04 ENCOUNTER — Ambulatory Visit (INDEPENDENT_AMBULATORY_CARE_PROVIDER_SITE_OTHER): Payer: Medicare Other | Admitting: "Endocrinology

## 2018-12-04 ENCOUNTER — Encounter: Payer: Self-pay | Admitting: "Endocrinology

## 2018-12-04 DIAGNOSIS — E210 Primary hyperparathyroidism: Secondary | ICD-10-CM | POA: Diagnosis not present

## 2018-12-04 NOTE — Progress Notes (Signed)
Endocrinology follow-up  Note -telephone visit      12/04/2018, 1:09 PM  Billy Hunt is a 73 y.o.-year-old male, who was seen in consultation for hypercalcemia/hyperparathyroidism. PMD:   Wilson Singer, MD   Past Medical History:  Diagnosis Date  . BPH (benign prostatic hyperplasia)   . Gout   . Hypercholesterolemia   . Hypertension   . Pulmonary emboli [facility])     Past Surgical History:  Procedure Laterality Date  . PERIPHERAL VASCULAR THROMBECTOMY    . TRANSURETHRAL RESECTION OF PROSTATE      Social History   Tobacco Use  . Smoking status: Former Smoker    Last attempt to quit: 04/21/1971    Years since quitting: 47.6  . Smokeless tobacco: Never Used  Substance Use Topics  . Alcohol use: No    Comment: former  . Drug use: Yes    Types: Marijuana    Comment: last use: 9/30    Family History  Problem Relation Age of Onset  . Alzheimer's disease Mother   . Cancer Father   . Deep vein thrombosis Brother     Outpatient Encounter Medications as of 12/04/2018  Medication Sig  . cholecalciferol (VITAMIN D3) 25 MCG (1000 UT) tablet Take 1,000 Units by mouth daily.  . tamsulosin (FLOMAX) 0.4 MG CAPS capsule Take 0.4 mg by mouth daily.  Marland Kitchen venlafaxine (EFFEXOR) 75 MG tablet Take by mouth 2 (two) times daily.   No facility-administered encounter medications on file as of 12/04/2018.     Allergies  Allergen Reactions  . Sulfa Antibiotics Hives     HPI  Billy Hunt reports that he was diagnosed with hypercalcemia approximately 6 years ago with blood test showing high PTH and high calcium.  He denies any history of nephrolithiasis, seizure disorder, cardiac dysrhythmias.  Denies any history of CKD.   -He however was found to have osteopenia in 2014.  He is repeat bone density in [facility] on November 25, 2018 showed left femur osteopenia with a T score of -1.6, left forearm radius 33% T score of -0.2,  spine excluded due to degenerative changes.   -He is currently not on calcium supplements, however on vitamin D3 1000 units daily.   -He denies any family history of parathyroid, thyroid, pituitary, adrenal dysfunction.   -Review of hisreferral package of most recent labs reveals calcium of 11.4 mg per DL associated with elevated PTH of 76 pg/mL on September 24, 2018.  Summarized 100 and page of paper he came with shows that his PTH ranged from 89-1 67 since 2014, phosphorus stayed between 2.2-2.4, urinary 24-hour calcium in June 2014 was 192 mg per 24 hours.  -24-hour urine calcium on November 22, 2018 was 221.   -No prior history of fragility fractures or falls.  He is a former  smoker.   he is not on HCTZ or other thiazide therapy.      PE: This is a telephone visit There were no vitals taken for this visit., There is no height or weight on file to calculate BMI.   CMP     Component Value Date/Time   NA 138 07/27/2017 0100  K 3.7 07/27/2017 0100   CL 106 07/27/2017 0100   CO2 24 07/27/2017 0100   GLUCOSE 111 (H) 07/27/2017 0100   BUN 24 (A) 09/17/2018   CREATININE 0.9 09/17/2018   CREATININE 0.95 07/27/2017 0100   CALCIUM 10.2 07/27/2017 0100   PROT 7.2 07/27/2017 0100   ALBUMIN 4.4 07/27/2017 0100   AST 25 07/27/2017 0100   ALT 17 07/27/2017 0100   ALKPHOS 66 07/27/2017 0100   BILITOT 0.6 07/27/2017 0100   GFRNONAA >60 07/27/2017 0100   GFRAA >60 07/27/2017 0100   Most recent official lab reports from September 17, 2018: 25-hydroxy vitamin D 43, PTH 76 elevated, calcium 11.4 mg per DL elevated, BUN 25, creatinine 0.98. -He came with separate handwritten summaries of his labs from 2014 showing PTH elevated between 89-1 67 between May and June 2014, 25 hydroxy vitamin D ranging between 8.6-27 between June and August 2014, phosphorus range from 2.2-2.3 between May and August 2014, 24-hour urine calcium 192 mg in 24 hours in June 2014. DEXA scan showed mild osteopenia in June  2014.   Surveillance DEXA on November 25, 2018  DualFemur Neck Left 11/25/2018 70.4 N/A -1.6 0.868 g/cm2  Left Forearm Radius 33% 11/25/2018 70.4 N/A -0.2 0.787 g/cm2  ASSESSMENT: BMD as determined from Femur Neck Left is 0.868 g/cm2 with a T-Score of -1.6.  This patient is considered OSTEOPENIC according to the World Health Organization [facility]) criteria.    Recent Results (from the past 2160 hour(s))  Basic metabolic panel     Status: Abnormal   Collection Time: 09/17/18 12:00 AM  Result Value Ref Range   BUN 24 (A) 4 - 21   Creatinine 0.9 0.6 - 1.3  Lipid panel     Status: Abnormal   Collection Time: 09/17/18 12:00 AM  Result Value Ref Range   Triglycerides 134 40 - 160   Cholesterol 240 (A) 0 - 200   HDL 51 35 - 70   LDL Cholesterol 163   Creatinine, urine, 24 hour     Status: None   Collection Time: 11/22/18  9:37 AM  Result Value Ref Range   Creatinine, 24H Ur 1.14 0.50 - 2.15 g/24 h  Calcium, urine, 24 hour     Status: None   Collection Time: 11/22/18  9:37 AM  Result Value Ref Range   Calcium, 24H Urine 221 mg/24 h    Comment:                           Reference Range  55-300                            Low calcium diet 55-200     Assessment: 1. Hypercalcemia / Hyperparathyroidism 2.  Osteopenia  Plan: Patient has had several instances of elevated calcium, with the highest level being at 11.4 mg/dL. A corresponding intact PTH level was also high, at 76 in January 2020.  He has had higher PTH of 167 in June 2014.  His repeat 24-hour urine calcium is 221.  DEXA scan shows osteopenia of femur.  This is still consistent with primary hyperparathyroidism at a mild form.  He will not need surgical intervention at this time. - Patient also  has had vitamin D deficiency.  He is advised to increase his vitamin D3 to 2000 units daily, advised to stay away from calcium supplements.   -He is approached for repeat  PTH/calcium, phosphorus, magnesium and  office visit  in 6  months.   -He is advised to continue his primary care follow-up with Dr. Karilyn Cota.   - Return in about 6 months (around 06/06/2019) for Follow up with Pre-visit Labs.   Marquis Lunch, MD [facility] [address] [place], [place] [address] Phone: [phone]  Fax: [phone]    This note was partially dictated with voice recognition software. Similar sounding words can be transcribed inadequately or may not  be corrected upon review.  12/04/2018, 1:09 PM

## 2019-02-20 ENCOUNTER — Ambulatory Visit (INDEPENDENT_AMBULATORY_CARE_PROVIDER_SITE_OTHER): Payer: Self-pay | Admitting: Internal Medicine

## 2019-02-20 DIAGNOSIS — N400 Enlarged prostate without lower urinary tract symptoms: Secondary | ICD-10-CM | POA: Diagnosis not present

## 2019-02-20 DIAGNOSIS — E559 Vitamin D deficiency, unspecified: Secondary | ICD-10-CM | POA: Diagnosis not present

## 2019-02-20 DIAGNOSIS — R6889 Other general symptoms and signs: Secondary | ICD-10-CM | POA: Diagnosis not present

## 2019-02-20 DIAGNOSIS — E785 Hyperlipidemia, unspecified: Secondary | ICD-10-CM | POA: Diagnosis not present

## 2019-02-20 DIAGNOSIS — F331 Major depressive disorder, recurrent, moderate: Secondary | ICD-10-CM | POA: Diagnosis not present

## 2019-02-20 DIAGNOSIS — I10 Essential (primary) hypertension: Secondary | ICD-10-CM | POA: Diagnosis not present

## 2019-05-29 ENCOUNTER — Ambulatory Visit (INDEPENDENT_AMBULATORY_CARE_PROVIDER_SITE_OTHER): Payer: Medicare Other | Admitting: Otolaryngology

## 2019-05-29 DIAGNOSIS — H903 Sensorineural hearing loss, bilateral: Secondary | ICD-10-CM

## 2019-05-29 DIAGNOSIS — H6121 Impacted cerumen, right ear: Secondary | ICD-10-CM

## 2019-06-02 DIAGNOSIS — E210 Primary hyperparathyroidism: Secondary | ICD-10-CM | POA: Diagnosis not present

## 2019-06-03 LAB — MAGNESIUM: Magnesium: 2.2 mg/dL (ref 1.5–2.5)

## 2019-06-03 LAB — PTH, INTACT AND CALCIUM
Calcium: 10.6 mg/dL — ABNORMAL HIGH (ref 8.6–10.3)
PTH: 62 pg/mL (ref 14–64)

## 2019-06-03 LAB — VITAMIN D 25 HYDROXY (VIT D DEFICIENCY, FRACTURES): Vit D, 25-Hydroxy: 53 ng/mL (ref 30–100)

## 2019-06-03 LAB — PHOSPHORUS: Phosphorus: 2.8 mg/dL (ref 2.1–4.3)

## 2019-06-09 ENCOUNTER — Encounter: Payer: Self-pay | Admitting: "Endocrinology

## 2019-06-09 ENCOUNTER — Other Ambulatory Visit: Payer: Self-pay

## 2019-06-09 ENCOUNTER — Ambulatory Visit (INDEPENDENT_AMBULATORY_CARE_PROVIDER_SITE_OTHER): Payer: Medicare Other | Admitting: "Endocrinology

## 2019-06-09 VITALS — BP 118/70 | HR 67 | Ht 69.0 in | Wt 167.0 lb

## 2019-06-09 DIAGNOSIS — M8580 Other specified disorders of bone density and structure, unspecified site: Secondary | ICD-10-CM | POA: Diagnosis not present

## 2019-06-09 DIAGNOSIS — E210 Primary hyperparathyroidism: Secondary | ICD-10-CM

## 2019-06-09 NOTE — Progress Notes (Signed)
06/09/2019, 2:03 PM  Endocrinology follow-up note   Billy Hunt is a 73 y.o.-year-old male, who is returning for follow-up after he was seen in consultation for hypercalcemia/hyperparathyroidism. PMD:   Billy Albee, MD   Past Medical History:  Diagnosis Date  . BPH (benign prostatic hyperplasia)   . Gout   . Hypercholesterolemia   . Hypertension   . Pulmonary emboli [facility])     Past Surgical History:  Procedure Laterality Date  . PERIPHERAL VASCULAR THROMBECTOMY    . TRANSURETHRAL RESECTION OF PROSTATE      Social History   Tobacco Use  . Smoking status: Former Smoker    Quit date: 04/21/1971    Years since quitting: 48.1  . Smokeless tobacco: Never Used  Substance Use Topics  . Alcohol use: No    Comment: former  . Drug use: Yes    Types: Marijuana    Comment: last use: 9/30    Family History  Problem Relation Age of Onset  . Alzheimer's disease Mother   . Cancer Father   . Deep vein thrombosis Brother     Outpatient Encounter Medications as of 06/09/2019  Medication Sig  . cholecalciferol (VITAMIN D3) 25 MCG (1000 UT) tablet Take 2,000 Units by mouth daily.   . tamsulosin (FLOMAX) 0.4 MG CAPS capsule Take 0.4 mg by mouth daily.  Marland Kitchen venlafaxine (EFFEXOR) 75 MG tablet Take by mouth 2 (two) times daily.   No facility-administered encounter medications on file as of 06/09/2019.     Allergies  Allergen Reactions  . Sulfa Antibiotics Hives     HPI  Billy Hunt reports that he was diagnosed with hypercalcemia approximately 6 years ago with blood test showing high PTH and high calcium.  He denies any history of nephrolithiasis, seizure disorder, cardiac dysrhythmias.  Denies any history of CKD.   -He however was found to have osteopenia in 2014.  He is repeat bone density in [facility] on November 25, 2018 showed left femur osteopenia with a T score of -1.6, left forearm radius 33% T score of -0.2, spine excluded due to degenerative changes.    -He is currently not on calcium supplements, however on vitamin D3 2000 units daily.   -He denies any family history of parathyroid, thyroid, pituitary, adrenal dysfunction.   -His previsit labs show PTH of 62 overall improving from 167, and calcium of 10.6, overall improving from 11.4.  - prior to his last visit, 24-hour urine calcium on November 22, 2018 was 221. He has no new complaints today.  -No prior history of fragility fractures or falls.  He is a former  smoker.   he is not on HCTZ or other thiazide therapy.       BP 118/70   Pulse 67   Ht 5\' 9"  (1.753 m)   Wt 167 lb (75.8 kg)   BMI 24.66 kg/m , Body mass index is 24.66 kg/m.   CMP     Component Value Date/Time   NA 138 07/27/2017 0100   K 3.7 07/27/2017 0100   CL 106 07/27/2017 0100   CO2 24 07/27/2017 0100   GLUCOSE 111 (H) 07/27/2017 0100   BUN 24 (A) 09/17/2018   CREATININE 0.9 09/17/2018   CREATININE 0.95 07/27/2017 0100   CALCIUM 10.6 (H) 06/02/2019 1242   PROT 7.2 07/27/2017 0100   ALBUMIN 4.4 07/27/2017 0100   AST 25 07/27/2017 0100   ALT 17 07/27/2017 0100   ALKPHOS 66  07/27/2017 0100   BILITOT 0.6 07/27/2017 0100   GFRNONAA >60 07/27/2017 0100   GFRAA >60 07/27/2017 0100   Most recent official lab reports from September 17, 2018: 25-hydroxy vitamin D 43, PTH 76 elevated, calcium 11.4 mg per DL elevated, BUN 25, creatinine 0.98. -He came with separate handwritten summaries of his labs from 2014 showing PTH elevated between 89-1 67 between May and June 2014, 25 hydroxy vitamin D ranging between 8.6-27 between June and August 2014, phosphorus range from 2.2-2.3 between May and August 2014, 24-hour urine calcium 192 mg in 24 hours in June 2014. DEXA scan showed mild osteopenia in June 2014.   Surveillance DEXA on November 25, 2018  DualFemur Neck Left 11/25/2018 70.4 N/A -1.6 0.868 g/cm2  Left Forearm Radius 33% 11/25/2018 70.4 N/A -0.2 0.787 g/cm2  ASSESSMENT: BMD as determined from Femur Neck  Left is 0.868 g/cm2 with a T-Score of -1.6.  This patient is considered OSTEOPENIC according to the World Health Organization [facility](WHO) criteria.    Recent Results (from the past 2160 hour(s))  PTH, intact and calcium     Status: Abnormal   Collection Time: 06/02/19 12:42 PM  Result Value Ref Range   PTH 62 14 - 64 pg/mL    Comment: . Interpretive Guide    Intact PTH           Calcium ------------------    ----------           ------- Normal Parathyroid    Normal               Normal Hypoparathyroidism    Low or Low Normal    Low Hyperparathyroidism    Primary            Normal or High       High    Secondary          High                 Normal or Low    Tertiary           High                 High Non-Parathyroid    Hypercalcemia      Low or Low Normal    High .    Calcium 10.6 (H) 8.6 - 10.3 mg/dL  Magnesium     Status: None   Collection Time: 06/02/19 12:42 PM  Result Value Ref Range   Magnesium 2.2 1.5 - 2.5 mg/dL  Phosphorus     Status: None   Collection Time: 06/02/19 12:42 PM  Result Value Ref Range   Phosphorus 2.8 2.1 - 4.3 mg/dL  VITAMIN D 25 Hydroxy (Vit-D Deficiency, Fractures)     Status: None   Collection Time: 06/02/19 12:42 PM  Result Value Ref Range   Vit D, 25-Hydroxy 53 30 - 100 ng/mL    Comment: Vitamin D Status         25-OH Vitamin D: . Deficiency:                    <20 ng/mL Insufficiency:             20 - 29 ng/mL Optimal:                 > or = 30 ng/mL . For 25-OH Vitamin D testing on patients on  D2-supplementation and patients for whom quantitation  of D2 and D3 fractions is required,  the [employer]ureD(TM) 25-OH VIT D, (D2,D3), LC/MS/MS is recommended: order  code 28768 (patients >91yrs). See Note 1 . Note 1 . For additional information, please refer to  http://education.Quest[facility].com/faq/FAQ199  (This link is being provided for informational/ educational purposes only.)     Assessment: 1. Hypercalcemia /  Hyperparathyroidism 2.  Osteopenia  Plan: His previsit repeat labs show PTH of 62 along with calcium of 10.6, overall improvement.   His repeat 24-hour urine calcium is 221.  DEXA scan shows osteopenia of femur.  This is still consistent with primary hyperparathyroidism at a mild form.  He will not need surgical intervention at this time. -He is on vitamin D supplement, currently therapeutic at 90.    He is advised to continue vitamin D3  2000 units daily, advised to stay away from calcium supplements.   -He is approached for repeat PTH/calcium and  office visit  in 6 months.  He will be considered for follow-up DEXA scan in March 2022.  -He is advised to continue his primary care follow-up with Dr. Karilyn Cota.  Time for this visit: 15 minutes. Consuello Bossier  participated in the discussions, expressed understanding, and voiced agreement with the above plans.  All questions were answered to his satisfaction. he is encouraged to contact clinic should he have any questions or concerns prior to his return visit.   - Return in about 1 year (around 06/08/2020) for Follow up with Pre-visit Labs.   Marquis Lunch, MD [facility] [address] [place], [place] [address] Phone: [phone]  Fax: [phone]    This note was partially dictated with voice recognition software. Similar sounding words can be transcribed inadequately or may not  be corrected upon review.  06/09/2019, 2:03 PM

## 2019-07-12 ENCOUNTER — Encounter (INDEPENDENT_AMBULATORY_CARE_PROVIDER_SITE_OTHER): Payer: Self-pay | Admitting: Nurse Practitioner

## 2019-07-12 DIAGNOSIS — Z0000 Encounter for general adult medical examination without abnormal findings: Secondary | ICD-10-CM | POA: Insufficient documentation

## 2019-07-21 ENCOUNTER — Ambulatory Visit (INDEPENDENT_AMBULATORY_CARE_PROVIDER_SITE_OTHER): Payer: Medicare Other | Admitting: Nurse Practitioner

## 2019-08-04 ENCOUNTER — Encounter (INDEPENDENT_AMBULATORY_CARE_PROVIDER_SITE_OTHER): Payer: Self-pay | Admitting: Nurse Practitioner

## 2019-08-04 ENCOUNTER — Ambulatory Visit (INDEPENDENT_AMBULATORY_CARE_PROVIDER_SITE_OTHER): Payer: Medicare Other | Admitting: Nurse Practitioner

## 2019-08-04 ENCOUNTER — Other Ambulatory Visit: Payer: Self-pay

## 2019-08-04 VITALS — BP 120/70 | HR 68 | Temp 98.7°F | Ht 69.0 in | Wt 166.4 lb

## 2019-08-04 DIAGNOSIS — Z23 Encounter for immunization: Secondary | ICD-10-CM | POA: Diagnosis not present

## 2019-08-04 DIAGNOSIS — Z0000 Encounter for general adult medical examination without abnormal findings: Secondary | ICD-10-CM | POA: Diagnosis not present

## 2019-08-04 NOTE — Progress Notes (Signed)
Subjective:   Billy Hunt is a 73 y.o. male who presents for Medicare Annual/Subsequent preventive examination.  Review of Systems:  negative       Objective:    Vitals: BP 120/70 (BP Location: Left Arm, Patient Position: Sitting, Cuff Size: Normal)   Pulse 68   Temp 98.7 F (37.1 C) (Temporal)   Ht 5' 9" (1.753 m)   Wt 166 lb 6.4 oz (75.5 kg)   SpO2 98%   BMI 24.57 kg/m   Body mass index is 24.57 kg/m.  Advanced Directives 06/12/2015 04/21/2015  Does Patient Have a Medical Advance Directive? Yes Yes  Type of Advance Directive - [place];Living will  Copy of [place] in Chart? No - copy requested -    Tobacco Social History   Tobacco Use  Smoking Status Former Smoker  . Packs/day: 1.00  . Years: 10.00  . Pack years: 10.00  . Types: Cigarettes  . Quit date: 04/21/1971  . Years since quitting: 48.3  Smokeless Tobacco Never Used     Counseling given: Not Answered   Clinical Intake:                       Past Medical History:  Diagnosis Date  . Anxiety   . BPH (benign prostatic hyperplasia)   . Gout   . Hypercholesterolemia   . Hyperparathyroidism ([place])   . Hypertension   . Pulmonary emboli ([place])   . Vitamin D deficiency    Past Surgical History:  Procedure Laterality Date  . PERIPHERAL VASCULAR THROMBECTOMY    . TRANSURETHRAL RESECTION OF PROSTATE    . VASECTOMY     Family History  Problem Relation Age of Onset  . Alzheimer's disease Mother        Vascular Dementia  . Stroke Mother   . Breast cancer Mother   . Cancer Father        Multiple Myeloma  . Hypertension Father   . Deep vein thrombosis Brother   . Heart disease Brother   . Cancer Paternal Grandmother   . Alcohol abuse Paternal Grandfather    Social History   Socioeconomic History  . Marital status: Single    Spouse name: Not on file  . Number of children: Not on file  . Years of education: Not on file  . Highest education  level: Not on file  Occupational History  . Not on file  Social Needs  . Financial resource strain: Not on file  . Food insecurity    Worry: Not on file    Inability: Not on file  . Transportation needs    Medical: Not on file    Non-medical: Not on file  Tobacco Use  . Smoking status: Former Smoker    Packs/day: 1.00    Years: 10.00    Pack years: 10.00    Types: Cigarettes    Quit date: 04/21/1971    Years since quitting: 48.3  . Smokeless tobacco: Never Used  Substance and Sexual Activity  . Alcohol use: No    Comment: former  . Drug use: Yes    Types: Marijuana    Comment: last use: 9/30  . Sexual activity: Yes    Birth control/protection: Other-see comments  Lifestyle  . Physical activity    Days per week: Not on file    Minutes per session: Not on file  . Stress: Not on file  Relationships  . Social connections  Talks on phone: Not on file    Gets together: Not on file    Attends religious service: Not on file    Active member of club or organization: Not on file    Attends meetings of clubs or organizations: Not on file    Relationship status: Not on file  Other Topics Concern  . Not on file  Social History Narrative  . Not on file    Outpatient Encounter Medications as of 08/04/2019  Medication Sig  . cholecalciferol (VITAMIN D3) 25 MCG (1000 UT) tablet Take 2,000 Units by mouth daily.   . tamsulosin (FLOMAX) 0.4 MG CAPS capsule Take 0.4 mg by mouth daily.  Marland Kitchen venlafaxine (EFFEXOR) 75 MG tablet Take by mouth 2 (two) times daily.   No facility-administered encounter medications on file as of 08/04/2019.     Activities of Daily Living In your present state of health, do you have any difficulty performing the following activities: 08/04/2019  Hearing? N  Vision? N  Difficulty concentrating or making decisions? N  Walking or climbing stairs? N  Dressing or bathing? N  Doing errands, shopping? N  Some recent data might be hidden    Patient Care  Team: Doree Albee, MD as PCP - General (Internal Medicine)   Assessment:   This is a routine wellness examination for Billy Hunt.  Exercise Activities and Dietary recommendations    Goals   None     Fall Risk Fall Risk  08/04/2019 08/04/2019 12/04/2018 11/20/2018  Falls in the past year? 0 0 0 0  Number falls in past yr: - 0 - -  Injury with Fall? - 0 - -  Follow up - - Falls evaluation completed Falls evaluation completed   Is the patient's home free of loose throw rugs in walkways, pet beds, electrical cords, etc?   yes      Grab bars in the bathroom? yes      Handrails on the stairs?   yes      Adequate lighting?   yes  Timed Get Up and Go Performed: 8 seconds  Depression Screen PHQ 2/9 Scores 08/04/2019 08/04/2019  PHQ - 2 Score 0 0    Cognitive Function     6CIT Screen 08/04/2019  What Year? 0 points  What month? 0 points  What time? 0 points  Count back from 20 0 points  Months in reverse 0 points  Repeat phrase 2 points  Total Score 2    Immunization History  Administered Date(s) Administered  . Pneumococcal Conjugate-13 08/16/2015  . Pneumococcal Polysaccharide-23 07/16/2017  . Tdap 05/15/2011  . Zoster Recombinat (Shingrix) 04/19/2018    Qualifies for Shingles Vaccine? N/A  Screening Tests Health Maintenance  Topic Date Due  . Hepatitis C Screening  [birth year]  . INFLUENZA VACCINE  04/12/2019  . TETANUS/TDAP  05/14/2021  . COLONOSCOPY  04/12/2023  . PNA vac Low Risk Adult  Completed   Cancer Screenings: Lung: Low Dose CT Chest recommended if Age 65-80 years, 30 pack-year currently smoking OR have quit w/in 15years. Patient does not qualify. Colorectal: Due in 2024  Additional Screenings:  Hepatitis C Screening: patient declined      Plan:   Will administer high-dose flu shot today.  We discussed his most form today and he does not wish to make any changes to this.  He will follow-up for office visit in 2 months, and for annual Medicare  wellness visit in 1 year.  He was encouraged to call the  office any questions or concerns prior to his next appointment.  I have personally reviewed and noted the following in the patient's chart:   . Medical and social history . Use of alcohol, tobacco or illicit drugs  . Current medications and supplements . Functional ability and status . Nutritional status . Physical activity . Advanced directives . List of other physicians . Hospitalizations, surgeries, and ER visits in previous 12 months . Vitals . Screenings to include cognitive, depression, and falls . Referrals and appointments  In addition, I have reviewed and discussed with patient certain preventive protocols, quality metrics, and best practice recommendations. A written personalized care plan for preventive services as well as general preventive health recommendations were provided to patient.     Ailene Ards, NP  08/04/2019

## 2019-08-04 NOTE — Patient Instructions (Signed)
Thank you for choosing [facility] as your medical provider! If you have any questions or concerns regarding your health care, please do not hesitate to call our office.  What we dicussed today: 1. Immunizations: You received the flu shot today 2. Medicare Wellness: You will be due for flu shot in 1 year. You are up to date with all other screenings accept those that you did not want completed (these are hepatitis C screening and sexually transmitted infection screening) 3. Advanced Care Planning: Let me know if you would like to make any changes to your MOST form as we discussed today.    Please follow-up as scheduled in 2 months. We look forward to seeing you again soon! Have a great Thanksgiving!!  At [facility] we value your feedback. You may receive a survey about your visit today. Please share your experience as we strive to create trusting relationships with our patients to provide genuine, compassionate, quality care.  We appreciate your understanding and patience as we review any laboratory studies, imaging, and other diagnostic tests that are ordered as we care for you. We do our best to address any and all results in a timely manner. If you do not hear about test results within 1 week, please do not hesitate to contact us. If we referred you to a specialist during your visit or ordered imaging testing, contact the office if you have not been contacted to be scheduled within 1 weeks.  We also encourage the use of MyChart, which contains your medical information for your review as well. If you are not enrolled in this feature, an access code is on this after visit summary for your convenience. Thank you for allowing [place] to be involved in your care.

## 2019-08-21 ENCOUNTER — Ambulatory Visit (INDEPENDENT_AMBULATORY_CARE_PROVIDER_SITE_OTHER): Payer: Medicare Other | Admitting: Nurse Practitioner

## 2019-09-01 ENCOUNTER — Other Ambulatory Visit (INDEPENDENT_AMBULATORY_CARE_PROVIDER_SITE_OTHER): Payer: Self-pay | Admitting: Internal Medicine

## 2019-09-30 ENCOUNTER — Ambulatory Visit (INDEPENDENT_AMBULATORY_CARE_PROVIDER_SITE_OTHER): Payer: Medicare Other | Admitting: Internal Medicine

## 2019-10-07 ENCOUNTER — Ambulatory Visit (INDEPENDENT_AMBULATORY_CARE_PROVIDER_SITE_OTHER): Payer: Medicare Other | Admitting: Internal Medicine

## 2019-10-07 ENCOUNTER — Other Ambulatory Visit: Payer: Self-pay

## 2019-10-07 ENCOUNTER — Encounter (INDEPENDENT_AMBULATORY_CARE_PROVIDER_SITE_OTHER): Payer: Self-pay | Admitting: Internal Medicine

## 2019-10-07 DIAGNOSIS — E559 Vitamin D deficiency, unspecified: Secondary | ICD-10-CM | POA: Diagnosis not present

## 2019-10-07 DIAGNOSIS — N401 Enlarged prostate with lower urinary tract symptoms: Secondary | ICD-10-CM | POA: Diagnosis not present

## 2019-10-07 DIAGNOSIS — R350 Frequency of micturition: Secondary | ICD-10-CM | POA: Diagnosis not present

## 2019-10-07 DIAGNOSIS — E782 Mixed hyperlipidemia: Secondary | ICD-10-CM | POA: Diagnosis not present

## 2019-10-07 DIAGNOSIS — N400 Enlarged prostate without lower urinary tract symptoms: Secondary | ICD-10-CM | POA: Insufficient documentation

## 2019-10-07 HISTORY — DX: Vitamin D deficiency, unspecified: E55.9

## 2019-10-07 NOTE — Progress Notes (Signed)
Metrics: Intervention Frequency ACO  Documented Smoking Status Yearly  Screened one or more times in 24 months  Cessation Counseling or  Active cessation medication Past 24 months  Past 24 months   Guideline developer: UpToDate (See UpToDate for funding source) Date Released: 2014       Wellness Office Visit  Subjective:  Patient ID: Billy Hunt, male    DOB: 10/24/1945  Age: 74 y.o. MRN: [id number]  CC: This is a virtual telephone visit with the permission of the patient who is at home and I am in my office.  I was able to easily recognize his voice. This visit is to follow-up on his chronic conditions which mainly include BPH and hyperparathyroidism. HPI His BPH is mostly controlled with the use of Flomax. He was being investigated by endocrinology for hypercalcemia and was found to have hyperparathyroidism.  I think that his vitamin D3 dose has been reduced to 2000 units daily and I do not see any need to follow further but I think he does have an appointment with endocrinology later on in the year. He feels well, he has no worrisome symptoms such as chest pain, dyspnea, palpitations or limb weakness. He continues to take his Effexor or for his anxiety which seems to keep him well controlled.  Past Medical History:  Diagnosis Date  . Anxiety   . BPH (benign prostatic hyperplasia)   . Gout   . Hypercholesterolemia   . Hyperparathyroidism ([place])   . Hypertension   . Pulmonary emboli ([place])   . Vitamin D deficiency   . Vitamin D deficiency disease 10/07/2019      Family History  Problem Relation Age of Onset  . Alzheimer's disease Mother        Vascular Dementia  . Stroke Mother   . Breast cancer Mother   . Cancer Father        Multiple Myeloma  . Hypertension Father   . Deep vein thrombosis Brother   . Heart disease Brother   . Cancer Paternal Grandmother   . Alcohol abuse Paternal Grandfather     Social History   Social History Narrative  . Not on file    Social History   Tobacco Use  . Smoking status: Former Smoker    Packs/day: 1.00    Years: 10.00    Pack years: 10.00    Types: Cigarettes    Quit date: 04/21/1971    Years since quitting: 48.4  . Smokeless tobacco: Never Used  Substance Use Topics  . Alcohol use: No    Comment: former    Current Meds  Medication Sig  . cholecalciferol (VITAMIN D3) 25 MCG (1000 UT) tablet Take 2,000 Units by mouth daily.   . tamsulosin (FLOMAX) 0.4 MG CAPS capsule Take 0.4 mg by mouth daily.  Marland Kitchen venlafaxine XR (EFFEXOR-XR) 150 MG 24 hr capsule Take 1 capsule by mouth once daily  . [DISCONTINUED] venlafaxine (EFFEXOR) 75 MG tablet Take by mouth 2 (two) times daily.       Objective:   Today's Vitals: There were no vitals taken for this visit. Vitals with BMI 08/04/2019 06/09/2019 11/20/2018  Height 5' 9"  5' 9"  5' 9"   Weight 166 lbs 6 oz 167 lbs 178 lbs  BMI 24.56 15.17 61.60  Systolic 737 106 269  Diastolic 70 70 72  Pulse 68 67 72     Physical Exam   He is alert and orientated on the phone.    Assessment   1. Mixed hyperlipidemia  2. Vitamin D deficiency disease   3. Benign prostatic hyperplasia with urinary frequency       Tests ordered No orders of the defined types were placed in this encounter.    Plan: 1. He will continue with all medications and he has an annual Medicare wellness visit coming up this December.  He will let me know if he has any problems in the meantime. 2. This phone call lasted 12 minutes.   No orders of the defined types were placed in this encounter.   Doree Albee, MD

## 2019-10-23 DIAGNOSIS — Z23 Encounter for immunization: Secondary | ICD-10-CM | POA: Diagnosis not present

## 2019-11-21 DIAGNOSIS — Z23 Encounter for immunization: Secondary | ICD-10-CM | POA: Diagnosis not present

## 2019-12-02 ENCOUNTER — Other Ambulatory Visit (INDEPENDENT_AMBULATORY_CARE_PROVIDER_SITE_OTHER): Payer: Self-pay | Admitting: Internal Medicine

## 2019-12-04 ENCOUNTER — Other Ambulatory Visit: Payer: Self-pay | Admitting: Otolaryngology

## 2019-12-04 ENCOUNTER — Other Ambulatory Visit (HOSPITAL_COMMUNITY): Payer: Self-pay | Admitting: Otolaryngology

## 2019-12-04 DIAGNOSIS — H9042 Sensorineural hearing loss, unilateral, left ear, with unrestricted hearing on the contralateral side: Secondary | ICD-10-CM

## 2019-12-04 DIAGNOSIS — H6121 Impacted cerumen, right ear: Secondary | ICD-10-CM | POA: Diagnosis not present

## 2019-12-04 DIAGNOSIS — H903 Sensorineural hearing loss, bilateral: Secondary | ICD-10-CM | POA: Diagnosis not present

## 2019-12-04 DIAGNOSIS — H838X3 Other specified diseases of inner ear, bilateral: Secondary | ICD-10-CM | POA: Diagnosis not present

## 2019-12-04 DIAGNOSIS — IMO0001 Reserved for inherently not codable concepts without codable children: Secondary | ICD-10-CM

## 2020-01-06 ENCOUNTER — Ambulatory Visit (HOSPITAL_COMMUNITY): Payer: Medicare Other

## 2020-01-29 ENCOUNTER — Ambulatory Visit (HOSPITAL_COMMUNITY): Payer: Medicare Other

## 2020-01-30 ENCOUNTER — Other Ambulatory Visit: Payer: Self-pay

## 2020-01-30 ENCOUNTER — Ambulatory Visit (HOSPITAL_COMMUNITY)
Admission: RE | Admit: 2020-01-30 | Discharge: 2020-01-30 | Disposition: A | Discharge status: Home / Self Care | Payer: Medicare Other | Source: Ambulatory Visit | Attending: Otolaryngology | Admitting: Otolaryngology

## 2020-01-30 DIAGNOSIS — H9192 Unspecified hearing loss, left ear: Secondary | ICD-10-CM | POA: Diagnosis not present

## 2020-01-30 DIAGNOSIS — IMO0001 Reserved for inherently not codable concepts without codable children: Secondary | ICD-10-CM

## 2020-01-30 DIAGNOSIS — H9042 Sensorineural hearing loss, unilateral, left ear, with unrestricted hearing on the contralateral side: Secondary | ICD-10-CM | POA: Insufficient documentation

## 2020-01-30 MED ORDER — GADOBUTROL 1 MMOL/ML IV SOLN
7.0000 mL | Freq: Once | INTRAVENOUS | Status: AC | PRN
  Administered 2020-01-30: 7 mL via INTRAVENOUS

## 2020-03-09 ENCOUNTER — Other Ambulatory Visit (INDEPENDENT_AMBULATORY_CARE_PROVIDER_SITE_OTHER): Payer: Self-pay | Admitting: Internal Medicine

## 2020-05-31 ENCOUNTER — Other Ambulatory Visit: Payer: Self-pay

## 2020-05-31 ENCOUNTER — Other Ambulatory Visit (INDEPENDENT_AMBULATORY_CARE_PROVIDER_SITE_OTHER): Payer: Self-pay | Admitting: Internal Medicine

## 2020-05-31 DIAGNOSIS — M8580 Other specified disorders of bone density and structure, unspecified site: Secondary | ICD-10-CM | POA: Diagnosis not present

## 2020-05-31 DIAGNOSIS — E210 Primary hyperparathyroidism: Secondary | ICD-10-CM | POA: Diagnosis not present

## 2020-06-01 LAB — PTH, INTACT AND CALCIUM
Calcium: 10.9 mg/dL — ABNORMAL HIGH (ref 8.6–10.3)
PTH: 130 pg/mL — ABNORMAL HIGH (ref 14–64)

## 2020-06-01 LAB — VITAMIN D 25 HYDROXY (VIT D DEFICIENCY, FRACTURES): Vit D, 25-Hydroxy: 50 ng/mL (ref 30–100)

## 2020-06-08 ENCOUNTER — Ambulatory Visit (INDEPENDENT_AMBULATORY_CARE_PROVIDER_SITE_OTHER): Payer: Medicare Other | Admitting: "Endocrinology

## 2020-06-08 ENCOUNTER — Encounter: Payer: Self-pay | Admitting: "Endocrinology

## 2020-06-08 ENCOUNTER — Other Ambulatory Visit: Payer: Self-pay

## 2020-06-08 VITALS — BP 136/70 | HR 60 | Ht 69.0 in | Wt 172.6 lb

## 2020-06-08 DIAGNOSIS — M8580 Other specified disorders of bone density and structure, unspecified site: Secondary | ICD-10-CM | POA: Diagnosis not present

## 2020-06-08 DIAGNOSIS — E210 Primary hyperparathyroidism: Secondary | ICD-10-CM | POA: Diagnosis not present

## 2020-06-08 NOTE — Progress Notes (Signed)
06/08/2020, 4:31 PM  Endocrinology follow-up note   Billy Hunt is a 74 y.o.-year-old male, who is returning for follow-up after he was seen in consultation for hypercalcemia/hyperparathyroidism, osteopenia. PMD:   Doree Albee, MD   Past Medical History:  Diagnosis Date  . Anxiety   . BPH (benign prostatic hyperplasia)   . Gout   . Hypercholesterolemia   . Hyperparathyroidism ([place])   . Hypertension   . Pulmonary emboli ([place])   . Vitamin D deficiency   . Vitamin D deficiency disease 10/07/2019    Past Surgical History:  Procedure Laterality Date  . PERIPHERAL VASCULAR THROMBECTOMY    . TRANSURETHRAL RESECTION OF PROSTATE    . VASECTOMY      Social History   Tobacco Use  . Smoking status: Former Smoker    Packs/day: 1.00    Years: 10.00    Pack years: 10.00    Types: Cigarettes    Quit date: 04/21/1971    Years since quitting: 49.1  . Smokeless tobacco: Never Used  Substance Use Topics  . Alcohol use: No    Comment: former  . Drug use: Yes    Types: Marijuana    Comment: last use: 9/30    Family History  Problem Relation Age of Onset  . Alzheimer's disease Mother        Vascular Dementia  . Stroke Mother   . Breast cancer Mother   . Cancer Father        Multiple Myeloma  . Hypertension Father   . Deep vein thrombosis Brother   . Heart disease Brother   . Cancer Paternal Grandmother   . Alcohol abuse Paternal Grandfather     Outpatient Encounter Medications as of 06/08/2020  Medication Sig  . cholecalciferol (VITAMIN D3) 25 MCG (1000 UT) tablet Take 2,000 Units by mouth daily.   . tamsulosin (FLOMAX) 0.4 MG CAPS capsule Take 1 capsule (0.4 mg total) by mouth daily.  Marland Kitchen venlafaxine XR (EFFEXOR-XR) 150 MG 24 hr capsule Take 1 capsule (150 mg total) by mouth daily.   No facility-administered encounter medications on file as of 06/08/2020.    Allergies  Allergen Reactions  . Sulfa Antibiotics Hives  . Sulfamethoxazole Rash     HPI   Billy Hunt reports that he was diagnosed with hypercalcemia approximately 6 years ago with blood test showing high PTH and high calcium.  He denies any history of nephrolithiasis, seizure disorder, cardiac dysrhythmias.  Denies any history of CKD.   -He however was found to have osteopenia in 2014.  He is repeat bone density in [facility] on November 25, 2018 showed left femur osteopenia with a T score of -1.6, left forearm radius 33% T score of -0.2, spine excluded due to degenerative changes.   -He is currently not on calcium supplements, however on vitamin D3 2000 units daily.   -He denies any family history of parathyroid, thyroid, pituitary, adrenal dysfunction.   -His most recent previsit labs show PTH of 130, calcium at 10.9 slowly increasing from 10 point, however still better than his maximum calcium of 11.4 last year.   - prior to his last visit, 24-hour urine calcium on November 22, 2018 was 221. He has no new complaints today.  No prior history of fragility fractures or falls.    He is a former  smoker.   he is not on HCTZ or other thiazide therapy.       BP 136/70  Pulse 60   Ht 5' 9" (1.753 m)   Wt 172 lb 9.6 oz (78.3 kg)   BMI 25.49 kg/m , Body mass index is 25.49 kg/m.   CMP     Component Value Date/Time   NA 138 07/27/2017 0100   K 3.7 07/27/2017 0100   CL 106 07/27/2017 0100   CO2 24 07/27/2017 0100   GLUCOSE 111 (H) 07/27/2017 0100   BUN 24 (A) 09/17/2018 0000   CREATININE 0.9 09/17/2018 0000   CREATININE 0.95 07/27/2017 0100   CALCIUM 10.9 (H) 05/31/2020 0808   PROT 7.2 07/27/2017 0100   ALBUMIN 4.4 07/27/2017 0100   AST 25 07/27/2017 0100   ALT 17 07/27/2017 0100   ALKPHOS 66 07/27/2017 0100   BILITOT 0.6 07/27/2017 0100   GFRNONAA >60 07/27/2017 0100   GFRAA >60 07/27/2017 0100   Most recent official lab reports from September 17, 2018: 25-hydroxy vitamin D 43, PTH 76 elevated, calcium 11.4 mg per DL elevated, BUN 25, creatinine 0.98. -He  came with separate handwritten summaries of his labs from 2014 showing PTH elevated between 89-1 67 between May and June 2014, 25 hydroxy vitamin D ranging between 8.6-27 between June and August 2014, phosphorus range from 2.2-2.3 between May and August 2014, 24-hour urine calcium 192 mg in 24 hours in June 2014. DEXA scan showed mild osteopenia in June 2014.   Surveillance DEXA on November 25, 2018  DualFemur Neck Left 11/25/2018 70.4 N/A -1.6 0.868 g/cm2  Left Forearm Radius 33% 11/25/2018 70.4 N/A -0.2 0.787 g/cm2  ASSESSMENT: BMD as determined from Femur Neck Left is 0.868 g/cm2 with a T-Score of -1.6.  This patient is considered OSTEOPENIC according to the World Health Organization (WHO) criteria.    Recent Results (from the past 2160 hour(s))  PTH, Intact and Calcium     Status: Abnormal   Collection Time: 05/31/20  8:08 AM  Result Value Ref Range   PTH 130 (H) 14 - 64 pg/mL    Comment: . Interpretive Guide    Intact PTH           Calcium ------------------    ----------           ------- Normal Parathyroid    Normal               Normal Hypoparathyroidism    Low or Low Normal    Low Hyperparathyroidism    Primary            Normal or High       High    Secondary          High                 Normal or Low    Tertiary           High                 High Non-Parathyroid    Hypercalcemia      Low or Low Normal    High .    Calcium 10.9 (H) 8.6 - 10.3 mg/dL  VITAMIN D 25 Hydroxy (Vit-D Deficiency, Fractures)     Status: None   Collection Time: 05/31/20  8:08 AM  Result Value Ref Range   Vit D, 25-Hydroxy 50 30 - 100 ng/mL    Comment: Vitamin D Status         25-OH Vitamin D: . Deficiency:                    <  20 ng/mL Insufficiency:             20 - 29 ng/mL Optimal:                 > or = 30 ng/mL . For 25-OH Vitamin D testing on patients on  D2-supplementation and patients for whom quantitation  of D2 and D3 fractions is required, the [facility]D(TM) 25-OH VIT  D, (D2,D3), LC/MS/MS is recommended: order  code [id number] (patients >58yr). See Note 1 . Note 1 . For additional information, please refer to  http://education.Quest[facility].com/faq/FAQ199  (This link is being provided for informational/ educational purposes only.)     Assessment: 1. Hypercalcemia / Hyperparathyroidism 2.  Osteopenia  Plan: His previsit repeat labs show PTH increasing to 130 and calcium increasing at 10.9.    His repeat 24-hour urine calcium is 221.  His previous DEXA scan shows osteopenia of femur.  This is still consistent with primary hyperparathyroidism at a mild form.  He will not need surgical intervention at this time. -He is on vitamin D supplement, currently therapeutic at 50.    He is advised to continue vitamin D3  2000 units daily, advised to stay away from calcium supplements.  -Before his next visit in 6 months, he will have repeat DEXA scan in March 2022 as well as CMP and PTH/calcium.  If he is found to fulfill criteria, he will be considered for surgical treatment.  No immediate intervention at this time other than vitamin D supplements. -He is advised to continue his primary care follow-up with Dr. GAnastasio Champion    - Time spent on this patient care encounter:  20 minutes of which 50% was spent in  counseling and the rest reviewing  his current and  previous labs / studies and medications  doses and developing a plan for long term care. PVeronda Prude participated in the discussions, expressed understanding, and voiced agreement with the above plans.  All questions were answered to his satisfaction. he is encouraged to contact clinic should he have any questions or concerns prior to his return visit.   - Return in about 6 months (around 12/06/2020) for DXA Scan B4 NV, F/U with Pre-visit Labs.   GGlade Lloyd MD [facility] [address]Phone: [phone] Fax: [phone]    This note was partially dictated with voice recognition software. Similar sounding words can be transcribed inadequately or may not  be corrected upon review.  06/08/2020, 4:31 PM

## 2020-06-18 DIAGNOSIS — G629 Polyneuropathy, unspecified: Secondary | ICD-10-CM | POA: Diagnosis not present

## 2020-06-18 DIAGNOSIS — M79671 Pain in right foot: Secondary | ICD-10-CM | POA: Diagnosis not present

## 2020-06-18 DIAGNOSIS — M79672 Pain in left foot: Secondary | ICD-10-CM | POA: Diagnosis not present

## 2020-07-05 DIAGNOSIS — Z23 Encounter for immunization: Secondary | ICD-10-CM | POA: Diagnosis not present

## 2020-07-16 DIAGNOSIS — G629 Polyneuropathy, unspecified: Secondary | ICD-10-CM | POA: Diagnosis not present

## 2020-07-16 DIAGNOSIS — M79671 Pain in right foot: Secondary | ICD-10-CM | POA: Diagnosis not present

## 2020-07-16 DIAGNOSIS — M79672 Pain in left foot: Secondary | ICD-10-CM | POA: Diagnosis not present

## 2020-08-16 ENCOUNTER — Encounter (INDEPENDENT_AMBULATORY_CARE_PROVIDER_SITE_OTHER): Payer: Self-pay | Admitting: Nurse Practitioner

## 2020-08-16 ENCOUNTER — Telehealth (INDEPENDENT_AMBULATORY_CARE_PROVIDER_SITE_OTHER): Payer: Self-pay | Admitting: Nurse Practitioner

## 2020-08-16 ENCOUNTER — Ambulatory Visit (INDEPENDENT_AMBULATORY_CARE_PROVIDER_SITE_OTHER): Payer: Medicare Other | Admitting: Nurse Practitioner

## 2020-08-16 ENCOUNTER — Other Ambulatory Visit: Payer: Self-pay

## 2020-08-16 ENCOUNTER — Encounter (INDEPENDENT_AMBULATORY_CARE_PROVIDER_SITE_OTHER): Payer: Medicare Other | Admitting: Nurse Practitioner

## 2020-08-16 VITALS — BP 128/68 | HR 55 | Temp 97.7°F | Ht 67.0 in | Wt 175.0 lb

## 2020-08-16 DIAGNOSIS — Z0000 Encounter for general adult medical examination without abnormal findings: Secondary | ICD-10-CM | POA: Diagnosis not present

## 2020-08-16 MED ORDER — VENLAFAXINE HCL ER 150 MG PO CP24: 150.0000 mg | ORAL_CAPSULE | Freq: Every day | ORAL | 0 refills | Status: DC

## 2020-08-16 MED ORDER — TAMSULOSIN HCL 0.4 MG PO CAPS: 0.4000 mg | ORAL_CAPSULE | Freq: Every day | ORAL | 0 refills | Status: DC

## 2020-08-16 NOTE — Telephone Encounter (Signed)
Please call this patient and see if he can come back bc I forgot to get you to administer the high dose flu shot. Sorry about that.

## 2020-08-16 NOTE — Patient Instructions (Signed)
  Billy Hunt , Thank you for taking time to come for your Medicare Wellness Visit. I appreciate your ongoing commitment to your health goals. Please review the following plan we discussed and let me know if I can assist you in the future.   These are the goals we discussed: Goals   None     This is a list of the screening recommended for you and due dates:  Health Maintenance  Topic Date Due  . Flu Shot  04/11/2020  .  Hepatitis C: One time screening is recommended by Center for Disease Control  (CDC) for  adults born from [birth year] through 1963.   08/16/2021*  . Tetanus Vaccine  05/14/2021  . Colon Cancer Screening  04/12/2023  . COVID-19 Vaccine  Completed  . Pneumonia vaccines  Completed  *Topic was postponed. The date shown is not the original due date.

## 2020-08-16 NOTE — Progress Notes (Signed)
Subjective:   Billy Hunt is a 74 y.o. male who presents for Medicare Annual/Subsequent preventive examination.   Cardiac Risk Factors include: advanced age (>36mn, >>53women);male gender     Objective:    Today's Vitals   08/16/20 1105  BP: 128/68  Pulse: (!) 55  Temp: 97.7 F (36.5 C)  TempSrc: Temporal  SpO2: 97%  Weight: 175 lb (79.4 kg)  Height: _0  (1.702 m)   Body mass index is 27.41 kg/m.  Advanced Directives 08/16/2020 08/04/2019 06/12/2015 04/21/2015  Does Patient Have a Medical Advance Directive? Yes Yes Yes Yes  Type of Advance Directive Living will;Healthcare Power of Attorney Out of facility DNR (pink MOST or yellow form) - HPress photographerLiving will  Does patient want to make changes to medical advance directive? No - Patient declined No - Patient declined - -  Copy of HY[place] Chart? No - copy requested - No - copy requested -    Current Medications (verified) Outpatient Encounter Medications as of 08/16/2020  Medication Sig  . cholecalciferol (VITAMIN D3) 25 MCG (1000 UT) tablet Take 2,000 Units by mouth daily.   .Marland Kitchengabapentin (NEURONTIN) 300 MG capsule Take 300 mg by mouth 3 (three) times daily.  . tamsulosin (FLOMAX) 0.4 MG CAPS capsule Take 1 capsule (0.4 mg total) by mouth daily.  .Marland Kitchenvenlafaxine XR (EFFEXOR-XR) 150 MG 24 hr capsule Take 1 capsule (150 mg total) by mouth daily.   No facility-administered encounter medications on file as of 08/16/2020.    Allergies (verified) Sulfa antibiotics and Sulfamethoxazole   History: Past Medical History:  Diagnosis Date  . Anxiety   . BPH (benign prostatic hyperplasia)   . Gout   . Hypercholesterolemia   . Hyperparathyroidism ([place]   . Hypertension   . Pulmonary emboli ([place]   . Vitamin D deficiency   . Vitamin D deficiency disease 10/07/2019   Past Surgical History:  Procedure Laterality Date  . PERIPHERAL VASCULAR THROMBECTOMY    . TRANSURETHRAL RESECTION  OF PROSTATE    . VASECTOMY     Family History  Problem Relation Age of Onset  . Alzheimer's disease Mother        Vascular Dementia  . Stroke Mother   . Breast cancer Mother   . Cancer Father        Multiple Myeloma  . Hypertension Father   . Deep vein thrombosis Brother   . Heart disease Brother   . Cancer Paternal Grandmother   . Alcohol abuse Paternal Grandfather    Social History   Socioeconomic History  . Marital status: Single    Spouse name: Not on file  . Number of children: Not on file  . Years of education: Not on file  . Highest education level: Not on file  Occupational History  . Not on file  Tobacco Use  . Smoking status: Former Smoker    Packs/day: 1.00    Years: 10.00    Pack years: 10.00    Types: Cigarettes    Quit date: 04/21/1971    Years since quitting: 49.3  . Smokeless tobacco: Never Used  Vaping Use  . Vaping Use: Never used  Substance and Sexual Activity  . Alcohol use: No    Comment: former  . Drug use: Yes    Types: Marijuana    Comment: last use: 9/30  . Sexual activity: Yes    Birth control/protection: Other-see comments  Other Topics Concern  . Not on file  Social History Narrative  . Not on file   Social Determinants of Health   Financial Resource Strain:   . Difficulty of Paying Living Expenses: Not on file  Food Insecurity:   . Worried About Charity fundraiser in the Last Year: Not on file  . Ran Out of Food in the Last Year: Not on file  Transportation Needs:   . Lack of Transportation (Medical): Not on file  . Lack of Transportation (Non-Medical): Not on file  Physical Activity:   . Days of Exercise per Week: Not on file  . Minutes of Exercise per Session: Not on file  Stress:   . Feeling of Stress : Not on file  Social Connections:   . Frequency of Communication with Friends and Family: Not on file  . Frequency of Social Gatherings with Friends and Family: Not on file  . Attends Religious Services: Not on file   . Active Member of Clubs or Organizations: Not on file  . Attends Archivist Meetings: Not on file  . Marital Status: Not on file    Tobacco Counseling Counseling given: Yes   Clinical Intake:  Pre-visit preparation completed: Yes  Pain : No/denies pain     BMI - recorded: 27.41 Nutritional Status: BMI 25 -29 Overweight Nutritional Risks: None Diabetes: No  How often do you need to have someone help you when you read instructions, pamphlets, or other written materials from your doctor or pharmacy?: 1 - Never What is the last grade level you completed in school?: Master's degree  Diabetic? No     Information entered by :: Jeralyn Ruths, NP-C   Activities of Daily Living In your present state of health, do you have any difficulty performing the following activities: 08/16/2020  Hearing? Y  Vision? N  Difficulty concentrating or making decisions? N  Walking or climbing stairs? N  Dressing or bathing? N  Doing errands, shopping? N  Preparing Food and eating ? N  Using the Toilet? N  In the past six months, have you accidently leaked urine? N  Do you have problems with loss of bowel control? N  Managing your Medications? N  Managing your Finances? N  Housekeeping or managing your Housekeeping? N  Some recent data might be hidden    Patient Care Team: Doree Albee, MD as PCP - General (Internal Medicine)  Indicate any recent Medical Services you may have received from other than [facility] providers in the past year (date may be approximate).     Assessment:   This is a routine wellness examination for Billy Hunt.  Hearing/Vision screen  Hearing Screening   _0  _1  _2  _3  _4  _5  _6  _7  _8   Right ear:           Left ear:             Visual Acuity Screening   Right eye Left eye Both eyes  Without correction: _9  With correction:       Dietary issues and exercise activities discussed: Current Exercise Habits:  Home exercise routine, Type of exercise: strength training/weights, Time (Minutes): 10, Frequency (Times/Week): 7, Weekly Exercise (Minutes/Week): 70, Intensity: Mild, Exercise limited by: neurologic condition(s)  Goals   None    Depression Screen PHQ 2/9 Scores 08/16/2020 08/04/2019 08/04/2019  PHQ - 2 Score 0 0 0  PHQ- 9 Score 0 - -    Fall Risk Fall Risk  08/16/2020 08/04/2019 08/04/2019 12/04/2018 11/20/2018  Falls in the past year?  0 0 0 0 0  Number falls in past yr: - - 0 - -  Injury with Fall? - - 0 - -  Follow up - - - Falls evaluation completed Falls evaluation completed    [place]:  Any stairs in or around the home? Yes  If so, are there any without handrails? Yes  Home free of loose throw rugs in walkways, pet beds, electrical cords, etc? Yes  - oriental throw rugs Adequate lighting in your home to reduce risk of falls? Yes   ASSISTIVE DEVICES UTILIZED TO PREVENT FALLS:  Life alert? No  Use of a cane, walker or w/c? No  Grab bars in the bathroom? Yes  Shower chair or bench in shower? No  - feels he can get one if he needs to Elevated toilet seat or a handicapped toilet? No   TIMED UP AND GO:  Was the test performed? Yes .  Length of time to ambulate 10 feet: 6 sec.   Gait steady and fast without use of assistive device  Cognitive Function:     6CIT Screen 08/16/2020 08/04/2019  What Year? 0 points 0 points  What month? 0 points 0 points  What time? 0 points 0 points  Count back from 20 0 points 0 points  Months in reverse 0 points 0 points  Repeat phrase 4 points 2 points  Total Score 4 2    Immunizations Immunization History  Administered Date(s) Administered  . Fluad Quad(high Dose 65+) 08/04/2019  . Moderna SARS-COVID-2 Vaccination 07/05/2020  . Pneumococcal Conjugate-13 08/16/2015  . Pneumococcal Polysaccharide-23 07/16/2017  . Tdap 05/15/2011  . Zoster Recombinat (Shingrix) 04/19/2018    TDAP status: Up to  date  Flu Vaccine status: Completed at today's visit  Pneumococcal vaccine status: Up to date  Covid-19 vaccine status: Completed vaccines  Qualifies for Shingles Vaccine? Yes   Zostavax completed No   Shingrix Completed?: Yes - has completed one dose plans on getting second dose  Screening Tests Health Maintenance  Topic Date Due  . Hepatitis C Screening  Never done  . INFLUENZA VACCINE  04/11/2020  . COVID-19 Vaccine (2 - Moderna 2-dose series) 08/02/2020  . TETANUS/TDAP  05/14/2021  . COLONOSCOPY  04/12/2023  . PNA vac Low Risk Adult  Completed    Health Maintenance  Health Maintenance Due  Topic Date Due  . Hepatitis C Screening  Never done  . INFLUENZA VACCINE  04/11/2020  . COVID-19 Vaccine (2 - Moderna 2-dose series) 08/02/2020    Colorectal cancer screening: Type of screening: Colonoscopy. Completed 2014. Repeat every 10 years  Lung Cancer Screening: (Low Dose CT Chest recommended if Age 4-80 years, 30 pack-year currently smoking OR have quit w/in 15years.) does not qualify.   Lung Cancer Screening Referral: N/A  Additional Screening:  Hepatitis C Screening: does qualify; Does not want to be tested at this time.   Vision Screening: Recommended annual ophthalmology exams for early detection of glaucoma and other disorders of the eye. Is the patient up to date with their annual eye exam?  No  Who is the provider or what is the name of the office in which the patient attends annual eye exams? Patient does not want to be evaluated at this time.  If pt is not established with a provider, would they like to be referred to a provider to establish care? No .   Dental Screening: Recommended annual dental exams for proper oral hygiene  Community  Resource Referral / Chronic Care Management: CRR required this visit?  Yes   CCM required this visit?  Yes      Plan:     I have personally reviewed and noted the following in the patient's chart:   . Medical and  social history . Use of alcohol, tobacco or illicit drugs  . Current medications and supplements . Functional ability and status . Nutritional status . Physical activity . Advanced directives . List of other physicians . Hospitalizations, surgeries, and ER visits in previous 12 months . Vitals . Screenings to include cognitive, depression, and falls . Referrals and appointments  In addition, I have reviewed and discussed with patient certain preventive protocols, quality metrics, and best practice recommendations. A written personalized care plan for preventive services as well as general preventive health recommendations were provided to patient.    Patient will follow up in 3 months for annual physical exam, and in 1 year for annual Medicare wellness visit.  Ailene Ards, NP   08/16/2020

## 2020-08-16 NOTE — Telephone Encounter (Signed)
Patient called back and will be coming tomorrow for a nurse visit for his flu vaccine.

## 2020-08-17 ENCOUNTER — Ambulatory Visit (INDEPENDENT_AMBULATORY_CARE_PROVIDER_SITE_OTHER): Payer: Medicare Other

## 2020-08-17 DIAGNOSIS — Z23 Encounter for immunization: Secondary | ICD-10-CM

## 2020-08-17 NOTE — Progress Notes (Signed)
Patient came in today for his High Dose Flu Vaccine and given in his Right Deltoid. Patient tolerated the vaccine well.

## 2020-10-29 DIAGNOSIS — G629 Polyneuropathy, unspecified: Secondary | ICD-10-CM | POA: Diagnosis not present

## 2020-11-15 ENCOUNTER — Ambulatory Visit (INDEPENDENT_AMBULATORY_CARE_PROVIDER_SITE_OTHER): Payer: Medicare Other | Admitting: Internal Medicine

## 2020-11-15 ENCOUNTER — Other Ambulatory Visit: Payer: Self-pay

## 2020-11-15 ENCOUNTER — Encounter (INDEPENDENT_AMBULATORY_CARE_PROVIDER_SITE_OTHER): Payer: Self-pay | Admitting: Internal Medicine

## 2020-11-15 VITALS — BP 128/80 | HR 54 | Temp 97.3°F | Ht 69.0 in | Wt 177.4 lb

## 2020-11-15 DIAGNOSIS — F32A Depression, unspecified: Secondary | ICD-10-CM | POA: Diagnosis not present

## 2020-11-15 DIAGNOSIS — R350 Frequency of micturition: Secondary | ICD-10-CM | POA: Diagnosis not present

## 2020-11-15 DIAGNOSIS — N401 Enlarged prostate with lower urinary tract symptoms: Secondary | ICD-10-CM

## 2020-11-15 DIAGNOSIS — E782 Mixed hyperlipidemia: Secondary | ICD-10-CM | POA: Diagnosis not present

## 2020-11-15 MED ORDER — VENLAFAXINE HCL ER 150 MG PO CP24
150.0000 mg | ORAL_CAPSULE | Freq: Every day | ORAL | 1 refills | Status: DC
Start: 2020-11-15 — End: 2021-05-27

## 2020-11-15 MED ORDER — TAMSULOSIN HCL 0.4 MG PO CAPS
0.4000 mg | ORAL_CAPSULE | Freq: Every day | ORAL | 1 refills | Status: DC
Start: 2020-11-15 — End: 2021-06-01

## 2020-11-15 NOTE — Progress Notes (Signed)
Metrics: Intervention Frequency ACO  Documented Smoking Status Yearly  Screened one or more times in 24 months  Cessation Counseling or  Active cessation medication Past 24 months  Past 24 months   Guideline developer: UpToDate (See UpToDate for funding source) Date Released: 2014       Wellness Office Visit  Subjective:  Patient ID: Billy Hunt, male    DOB: 1946/01/07  Age: 75 y.o. MRN: [id number]  CC: This man comes in for follow-up of BPH, dyslipidemia, depression and peripheral neuropathy. HPI  He is doing really well.  He has no major concerns today.  He continues on Effexor for his depression which is keeping him very stable. He continues on tamsulosin for his BPH symptoms. He continues to see Dr. Dorris Fetch for his hypercalcemia and possible hyperparathyroidism.  Is due for another bone density scan this year as he has osteopenia. Past Medical History:  Diagnosis Date   Anxiety    BPH (benign prostatic hyperplasia)    Gout    Hypercholesterolemia    Hyperparathyroidism ([place])    Hypertension    Pulmonary emboli (H[facility])    Vitamin D deficiency    Vitamin D deficiency disease 10/07/2019   Past Surgical History:  Procedure Laterality Date   PERIPHERAL VASCULAR THROMBECTOMY     TRANSURETHRAL RESECTION OF PROSTATE     VASECTOMY       Family History  Problem Relation Age of Onset   Alzheimer's disease Mother        Vascular Dementia   Stroke Mother    Breast cancer Mother    Cancer Father        Multiple Myeloma   Hypertension Father    Deep vein thrombosis Brother    Heart disease Brother    Cancer Paternal Grandmother    Alcohol abuse Paternal Grandfather     Social History   Social History Narrative   Not on file   Social History   Tobacco Use   Smoking status: Former Smoker    Packs/day: 1.00    Years: 10.00    Pack years: 10.00    Types: Cigarettes    Quit date: 04/21/1971    Years since quitting: 49.6   Smokeless tobacco:  Never Used  Substance Use Topics   Alcohol use: No    Comment: former    Current Meds  Medication Sig   cholecalciferol (VITAMIN D3) 25 MCG (1000 UT) tablet Take 2,000 Units by mouth daily.    gabapentin (NEURONTIN) 300 MG capsule Take 300 mg by mouth 3 (three) times daily.   [DISCONTINUED] tamsulosin (FLOMAX) 0.4 MG CAPS capsule Take 1 capsule (0.4 mg total) by mouth daily.   [DISCONTINUED] venlafaxine XR (EFFEXOR-XR) 150 MG 24 hr capsule Take 1 capsule (150 mg total) by mouth daily.     [place] Office Visit from 11/15/2020 in [facility]  PHQ-9 Total Score 0      Objective:   Today's Vitals: BP 128/80    Pulse (!) 54    Temp (!) 97.3 F (36.3 C) (Temporal)    Ht $R'5\' 9"'Jw$  (1.753 m)    Wt 177 lb 6.4 oz (80.5 kg)    SpO2 97%    BMI 26.20 kg/m  Vitals with BMI 11/15/2020 08/16/2020 06/08/2020  Height $Remov'5\' 9"'jQMeHm$  $Remove'5\' 7"'yeRAjbq$  $RemoveB'5\' 9"'qzAZIhkA$   Weight 177 lbs 6 oz 175 lbs 172 lbs 10 oz  BMI 26.19 15.1 76.16  Systolic 073 710 626  Diastolic 80 68 70  Pulse 54 55 60  Physical Exam   He looks systemically well.  Weight is stable.  Blood pressure is in good control.    Assessment   1. Mixed hyperlipidemia   2. Benign prostatic hyperplasia with urinary frequency   3. Depression, unspecified depression type       Tests ordered No orders of the defined types were placed in this encounter.    Plan: 1. He will continue with time he lotion for his BPH and I have refilled this medication. 2. He will continue with Effexor for his depression I have refilled this medication. 3. Follow-up with Judson Roch as previously scheduled in December for for an annual Medicare wellness visit   Meds ordered this encounter  Medications   tamsulosin (FLOMAX) 0.4 MG CAPS capsule    Sig: Take 1 capsule (0.4 mg total) by mouth daily.    Dispense:  90 capsule    Refill:  1   venlafaxine XR (EFFEXOR-XR) 150 MG 24 hr capsule    Sig: Take 1 capsule (150 mg total) by mouth daily.    Dispense:  90  capsule    Refill:  1    Julien Berryman Luther Parody, MD

## 2020-11-26 ENCOUNTER — Ambulatory Visit (HOSPITAL_COMMUNITY)
Admission: RE | Admit: 2020-11-26 | Discharge: 2020-11-26 | Disposition: A | Discharge status: Home / Self Care | Payer: Medicare Other | Source: Ambulatory Visit | Attending: "Endocrinology | Admitting: "Endocrinology

## 2020-11-26 ENCOUNTER — Other Ambulatory Visit: Payer: Self-pay

## 2020-11-26 DIAGNOSIS — E210 Primary hyperparathyroidism: Secondary | ICD-10-CM | POA: Diagnosis not present

## 2020-11-26 DIAGNOSIS — M8580 Other specified disorders of bone density and structure, unspecified site: Secondary | ICD-10-CM | POA: Insufficient documentation

## 2020-11-26 DIAGNOSIS — M8589 Other specified disorders of bone density and structure, multiple sites: Secondary | ICD-10-CM | POA: Diagnosis not present

## 2020-11-29 DIAGNOSIS — M8580 Other specified disorders of bone density and structure, unspecified site: Secondary | ICD-10-CM | POA: Diagnosis not present

## 2020-11-29 DIAGNOSIS — E210 Primary hyperparathyroidism: Secondary | ICD-10-CM | POA: Diagnosis not present

## 2020-11-30 LAB — COMPREHENSIVE METABOLIC PANEL
ALT: 11 IU/L (ref 0–44)
AST: 16 IU/L (ref 0–40)
Albumin/Globulin Ratio: 1.4 (ref 1.2–2.2)
Albumin: 4.4 g/dL (ref 3.7–4.7)
Alkaline Phosphatase: 75 IU/L (ref 44–121)
BUN/Creatinine Ratio: 21 (ref 10–24)
BUN: 18 mg/dL (ref 8–27)
Bilirubin Total: 0.4 mg/dL (ref 0.0–1.2)
CO2: 21 mmol/L (ref 20–29)
Calcium: 10.6 mg/dL — ABNORMAL HIGH (ref 8.6–10.2)
Chloride: 103 mmol/L (ref 96–106)
Creatinine, Ser: 0.86 mg/dL (ref 0.76–1.27)
Globulin, Total: 3.1 g/dL (ref 1.5–4.5)
Glucose: 99 mg/dL (ref 65–99)
Potassium: 4.6 mmol/L (ref 3.5–5.2)
Sodium: 138 mmol/L (ref 134–144)
Total Protein: 7.5 g/dL (ref 6.0–8.5)
eGFR: 92 mL/min/{1.73_m2} (ref >59)

## 2020-11-30 LAB — PTH, INTACT AND CALCIUM: PTH: 72 pg/mL — ABNORMAL HIGH (ref 15–65)

## 2020-12-06 ENCOUNTER — Other Ambulatory Visit: Payer: Self-pay

## 2020-12-06 ENCOUNTER — Ambulatory Visit (INDEPENDENT_AMBULATORY_CARE_PROVIDER_SITE_OTHER): Payer: Medicare Other | Admitting: "Endocrinology

## 2020-12-06 ENCOUNTER — Encounter: Payer: Self-pay | Admitting: "Endocrinology

## 2020-12-06 VITALS — BP 126/64 | HR 56 | Ht 69.0 in | Wt 179.4 lb

## 2020-12-06 DIAGNOSIS — E210 Primary hyperparathyroidism: Secondary | ICD-10-CM

## 2020-12-06 DIAGNOSIS — M8580 Other specified disorders of bone density and structure, unspecified site: Secondary | ICD-10-CM | POA: Diagnosis not present

## 2020-12-06 NOTE — Progress Notes (Signed)
12/06/2020, 1:31 PM  Endocrinology follow-up note   Billy Hunt is a 75 y.o.-year-old male, who is returning for follow-up after he was seen in consultation for hypercalcemia/hyperparathyroidism, osteopenia. PMD:   Billy Albee, MD   Past Medical History:  Diagnosis Date  . Anxiety   . BPH (benign prostatic hyperplasia)   . Gout   . Hypercholesterolemia   . Hyperparathyroidism ([place])   . Hypertension   . Pulmonary emboli ([place])   . Vitamin D deficiency   . Vitamin D deficiency disease 10/07/2019    Past Surgical History:  Procedure Laterality Date  . PERIPHERAL VASCULAR THROMBECTOMY    . TRANSURETHRAL RESECTION OF PROSTATE    . VASECTOMY      Social History   Tobacco Use  . Smoking status: Former Smoker    Packs/day: 1.00    Years: 10.00    Pack years: 10.00    Types: Cigarettes    Quit date: 04/21/1971    Years since quitting: 49.6  . Smokeless tobacco: Never Used  Vaping Use  . Vaping Use: Never used  Substance Use Topics  . Alcohol use: No    Comment: former  . Drug use: Yes    Types: Marijuana    Comment: last use: 9/30    Family History  Problem Relation Age of Onset  . Alzheimer's disease Mother        Vascular Dementia  . Stroke Mother   . Breast cancer Mother   . Cancer Father        Multiple Myeloma  . Hypertension Father   . Deep vein thrombosis Brother   . Heart disease Brother   . Cancer Paternal Grandmother   . Alcohol abuse Paternal Grandfather     Outpatient Encounter Medications as of 12/06/2020  Medication Sig  . cholecalciferol (VITAMIN D3) 25 MCG (1000 UT) tablet Take 2,000 Units by mouth daily.   Marland Kitchen gabapentin (NEURONTIN) 300 MG capsule Take 300 mg by mouth 3 (three) times daily.  . tamsulosin (FLOMAX) 0.4 MG CAPS capsule Take 1 capsule (0.4 mg total) by mouth daily.  Marland Kitchen venlafaxine XR (EFFEXOR-XR) 150 MG 24 hr capsule Take 1 capsule (150 mg total) by mouth daily.   No facility-administered encounter medications on  file as of 12/06/2020.    Allergies  Allergen Reactions  . Sulfa Antibiotics Hives  . Sulfamethoxazole Rash     HPI  Billy Hunt reports that he was diagnosed with hypercalcemia approximately 6 years ago with blood test showing high PTH and high calcium.  He denies any history of nephrolithiasis, seizure disorder, cardiac dysrhythmias.  Denies any history of CKD.   -He however was found to have osteopenia in 2014.  He is repeat bone density in [facility] on November 25, 2018 showed left femur osteopenia with a T score of -1.6, left forearm radius 33% T score of -0.2, spine excluded due to degenerative changes.   His most recent bone density on November 26, 2020 shows similar findings still consistent with osteopenia.  -He is currently not on calcium supplements, however on vitamin D3 2000 units daily.   -He denies any family history of parathyroid, thyroid, pituitary, adrenal dysfunction.   -His most recent previsit labs show PTH of 72 improving from 130, calcium stable at 10.6 unchanged from 10.9 during his last visit, still significantly better than his maximum calcium of 11.4 2 years ago.     - prior to his last visit, 24-hour  urine calcium on November 22, 2018 was 221. He has no new complaints today.  No prior history of fragility fractures or falls.    He is a former  smoker.   he is not on HCTZ or other thiazide therapy.       BP 126/64   Pulse (!) 56   Ht 5' 9"  (1.753 m)   Wt 179 lb 6.4 oz (81.4 kg)   BMI 26.49 kg/m , Body mass index is 26.49 kg/m.   CMP     Component Value Date/Time   NA 138 11/29/2020 0929   K 4.6 11/29/2020 0929   CL 103 11/29/2020 0929   CO2 21 11/29/2020 0929   GLUCOSE 99 11/29/2020 0929   GLUCOSE 111 (H) 07/27/2017 0100   BUN 18 11/29/2020 0929   CREATININE 0.86 11/29/2020 0929   CALCIUM 10.6 (H) 11/29/2020 0929   PROT 7.5 11/29/2020 0929   ALBUMIN 4.4 11/29/2020 0929   AST 16 11/29/2020 0929   ALT 11 11/29/2020 0929   ALKPHOS 75  11/29/2020 0929   BILITOT 0.4 11/29/2020 0929   GFRNONAA >60 07/27/2017 0100   GFRAA >60 07/27/2017 0100   Most recent official lab reports from September 17, 2018: 25-hydroxy vitamin D 43, PTH 76 elevated, calcium 11.4 mg per DL elevated, BUN 25, creatinine 0.98. -He came with separate handwritten summaries of his labs from 2014 showing PTH elevated between 89-1 67 between May and June 2014, 25 hydroxy vitamin D ranging between 8.6-27 between June and August 2014, phosphorus range from 2.2-2.3 between May and August 2014, 24-hour urine calcium 192 mg in 24 hours in June 2014. DEXA scan showed mild osteopenia in June 2014.   Surveillance DEXA on November 25, 2018  DualFemur Neck Left 11/25/2018 70.4 N/A -1.6 0.868 g/cm2  Left Forearm Radius 33% 11/25/2018 70.4 N/A -0.2 0.787 g/cm2  ASSESSMENT: BMD as determined from Femur Neck Left is 0.868 g/cm2 with a T-Score of -1.6.  This patient is considered OSTEOPENIC according to the [facility]) criteria.    Repeat bone density on November 26, 2020: DualFemur Neck Left 11/26/2020 72.4 N/A -1.6 0.857 g/cm2 -1.3% - DualFemur Neck Left 11/25/2018 70.4 N/A -1.6 0.868 g/cm2 - -  DualFemur Total Mean 11/26/2020 72.4 N/A -1.3 0.914 g/cm2 -6.3% Yes DualFemur Total Mean 11/25/2018 70.4 N/A -0.9 0.975 g/cm2 - -  Left Forearm Radius 33% 11/26/2020 72.4 N/A -0.5 0.768 g/cm2 -2.3% - Left Forearm Radius 33% 11/25/2018 70.4 N/A -0.2 0.787 g/cm2 - -  ASSESSMENT: BMD as determined from Femur Neck Left is 0.857 g/cm2 with aT-score of -1.6.  This patient is considered OSTEOPENIC according to [facility]) criteria.  Recent Results (from the past 2160 hour(s))  Comprehensive metabolic panel     Status: Abnormal   Collection Time: 11/29/20  9:29 AM  Result Value Ref Range   Glucose 99 65 - 99 mg/dL   BUN 18 8 - 27 mg/dL   Creatinine, Ser 0.86 0.76 - 1.27 mg/dL   eGFR 92 >59 mL/min/1.73   BUN/Creatinine Ratio 21  10 - 24   Sodium 138 134 - 144 mmol/L   Potassium 4.6 3.5 - 5.2 mmol/L   Chloride 103 96 - 106 mmol/L   CO2 21 20 - 29 mmol/L   Calcium 10.6 (H) 8.6 - 10.2 mg/dL   Total Protein 7.5 6.0 - 8.5 g/dL   Albumin 4.4 3.7 - 4.7 g/dL   Globulin, Total 3.1 1.5 - 4.5 g/dL   Albumin/Globulin Ratio  1.4 1.2 - 2.2   Bilirubin Total 0.4 0.0 - 1.2 mg/dL   Alkaline Phosphatase 75 44 - 121 IU/L   AST 16 0 - 40 IU/L   ALT 11 0 - 44 IU/L  PTH, intact and calcium     Status: Abnormal   Collection Time: 11/29/20  9:29 AM  Result Value Ref Range   PTH 72 (H) 15 - 65 pg/mL   PTH Interp Comment     Comment: Interpretation                 Intact PTH    Calcium                                 (pg/mL)      (mg/dL) Normal                          15 - 65     8.6 - 10.2 Primary Hyperparathyroidism         >65          >10.2 Secondary Hyperparathyroidism       >65          <10.2 Non-Parathyroid Hypercalcemia       <65          >10.2 Hypoparathyroidism                  <15          < 8.6 Non-Parathyroid Hypocalcemia    15 - 65          < 8.6     Assessment: 1. Hypercalcemia / Hyperparathyroidism 2.  Osteopenia  Plan: His previsit repeat labs show PTH improving to 72 from 130, calcium remaining stable at 10.6.  His bone density is still consistent with osteopenia of femur.  His lumbar spine was excluded due to degenerative joint diseases. His repeat 24-hour urine calcium is 221.  This is still consistent with primary hyperparathyroidism at a mild form.  He does not have criteria for considering surgery at this time.   -He is on vitamin D supplement, currently therapeutic at 23.    He is advised to continue vitamin D3  2000 units daily, advised to stay away from calcium supplements.  -Before his next visit in 6 months, he will have repeat PTH/calcium, and 25-hydroxy vitamin D.    If he is found to fulfill criteria, he will be considered for surgical treatment.   -He is advised to continue his primary care  follow-up with Dr. Anastasio Champion.     - Time spent on this patient care encounter:  30 minutes of which 50% was spent in  counseling and the rest reviewing  his current and  previous labs / studies and medications  doses and developing a plan for long term care, and documenting this care. Veronda Prude  participated in the discussions, expressed understanding, and voiced agreement with the above plans.  All questions were answered to his satisfaction. he is encouraged to contact clinic should he have any questions or concerns prior to his return visit.    - Return in about 6 months (around 06/08/2021) for F/U with Pre-visit Labs.   Glade Lloyd, MD [facility] [address] [place], [place] [address] Phone: [phone]  Fax: [phone]    This note was partially dictated with voice recognition software. Similar sounding  words can be transcribed inadequately or may not  be corrected upon review.  12/06/2020, 1:31 PM

## 2020-12-18 DIAGNOSIS — Z23 Encounter for immunization: Secondary | ICD-10-CM | POA: Diagnosis not present

## 2021-02-01 ENCOUNTER — Telehealth (INDEPENDENT_AMBULATORY_CARE_PROVIDER_SITE_OTHER): Payer: Self-pay | Admitting: Internal Medicine

## 2021-02-01 ENCOUNTER — Encounter (INDEPENDENT_AMBULATORY_CARE_PROVIDER_SITE_OTHER): Payer: Self-pay | Admitting: Internal Medicine

## 2021-02-01 NOTE — Telephone Encounter (Signed)
I think we can do 2:15 PM tomorrow.  Please make the appointment.  Thanks.

## 2021-02-02 ENCOUNTER — Ambulatory Visit (INDEPENDENT_AMBULATORY_CARE_PROVIDER_SITE_OTHER): Payer: Medicare Other | Admitting: Internal Medicine

## 2021-02-02 ENCOUNTER — Other Ambulatory Visit: Payer: Self-pay

## 2021-02-02 ENCOUNTER — Encounter (INDEPENDENT_AMBULATORY_CARE_PROVIDER_SITE_OTHER): Payer: Self-pay | Admitting: Internal Medicine

## 2021-02-02 VITALS — BP 122/66 | HR 65 | Temp 97.7°F | Resp 18 | Ht 69.0 in | Wt 178.6 lb

## 2021-02-02 DIAGNOSIS — M545 Low back pain, unspecified: Secondary | ICD-10-CM

## 2021-02-02 MED ORDER — DEXAMETHASONE SODIUM PHOSPHATE 4 MG/ML IJ SOLN: 4.0000 mg | Freq: Once | INTRAMUSCULAR | Status: DC

## 2021-02-02 MED ORDER — PREDNISONE 20 MG PO TABS: 40.0000 mg | ORAL_TABLET | Freq: Every day | ORAL | 1 refills | Status: DC

## 2021-02-02 NOTE — Progress Notes (Signed)
Back pain started yesterday after he was in shower. Walking not great today.

## 2021-02-02 NOTE — Addendum Note (Signed)
Addended by: Lacie Scotts on: 02/02/2021 02:47 PM   Modules accepted: Orders

## 2021-02-02 NOTE — Progress Notes (Signed)
Metrics: Intervention Frequency ACO  Documented Smoking Status Yearly  Screened one or more times in 24 months  Cessation Counseling or  Active cessation medication Past 24 months  Past 24 months   Guideline developer: UpToDate (See UpToDate for funding source) Date Released: 2014       Wellness Office Visit  Subjective:  Patient ID: Billy Hunt, male    DOB: 1946-06-19  Age: 75 y.o. MRN: [id number]  CC: Low back pain HPI  Started suddenly yesterday morning in the shower.Prior day,he had been working in the yard. Pain 10/10 but no radiation from the lower back. No urinary/bowel problems. Taking Ibuprofen OTC,with some relief. Past Medical History:  Diagnosis Date  . Anxiety   . BPH (benign prostatic hyperplasia)   . Gout   . Hypercholesterolemia   . Hyperparathyroidism ([place])   . Hypertension   . Pulmonary emboli ([place])   . Vitamin D deficiency   . Vitamin D deficiency disease 10/07/2019   Past Surgical History:  Procedure Laterality Date  . PERIPHERAL VASCULAR THROMBECTOMY    . TRANSURETHRAL RESECTION OF PROSTATE    . VASECTOMY       Family History  Problem Relation Age of Onset  . Alzheimer's disease Mother        Vascular Dementia  . Stroke Mother   . Breast cancer Mother   . Cancer Father        Multiple Myeloma  . Hypertension Father   . Deep vein thrombosis Brother   . Heart disease Brother   . Cancer Paternal Grandmother   . Alcohol abuse Paternal Grandfather     Social History   Social History Narrative  . Not on file   Social History   Tobacco Use  . Smoking status: Former Smoker    Packs/day: 1.00    Years: 10.00    Pack years: 10.00    Types: Cigarettes    Quit date: 04/21/1971    Years since quitting: 49.8  . Smokeless tobacco: Never Used  Substance Use Topics  . Alcohol use: No    Comment: former    Current Meds  Medication Sig  . cholecalciferol (VITAMIN D3) 25 MCG (1000 UT) tablet Take 2,000 Units by mouth daily.   Marland Kitchen  gabapentin (NEURONTIN) 300 MG capsule Take 300 mg by mouth 3 (three) times daily.  . predniSONE (DELTASONE) 20 MG tablet Take 2 tablets (40 mg total) by mouth daily with breakfast.  . tamsulosin (FLOMAX) 0.4 MG CAPS capsule Take 1 capsule (0.4 mg total) by mouth daily.  Marland Kitchen venlafaxine XR (EFFEXOR-XR) 150 MG 24 hr capsule Take 1 capsule (150 mg total) by mouth daily.     [place] Office Visit from 11/15/2020 in [facility]  PHQ-9 Total Score 0      Objective:   Today's Vitals: BP 122/66 (BP Location: Right Arm, Patient Position: Sitting, Cuff Size: Normal)   Pulse 65   Temp 97.7 F (36.5 C) (Temporal)   Resp 18   Ht _0  (1.753 m)   Wt 178 lb 9.6 oz (81 kg)   SpO2 98%   BMI 26.37 kg/m  Vitals with BMI 02/02/2021 12/06/2020 11/15/2020  Height _1  _2  _3   Weight 178 lbs 10 oz 179 lbs 6 oz 177 lbs 6 oz  BMI 26.36 17.61 60.73  Systolic 710 626 948  Diastolic 66 64 80  Pulse 65 56 54     Physical Exam  Not in acute pain. No bony spinal tenderness,I  can feel muscle spasm paramedian areas bilaterallly.     Assessment   1. Acute bilateral low back pain without sciatica       Tests ordered No orders of the defined types were placed in this encounter.    Plan: 1. Muscular spasm,will treat with Dexamethasone 33m IM in the office followed by oral Prednisone starting tomorrow morning for 5 days.Will let me know he does not improve.   Meds ordered this encounter  Medications  . predniSONE (DELTASONE) 20 MG tablet    Sig: Take 2 tablets (40 mg total) by mouth daily with breakfast.    Dispense:  10 tablet    Refill:  1    Sennie Borden CLuther Parody MD

## 2021-03-31 DIAGNOSIS — H903 Sensorineural hearing loss, bilateral: Secondary | ICD-10-CM | POA: Diagnosis not present

## 2021-03-31 DIAGNOSIS — H6121 Impacted cerumen, right ear: Secondary | ICD-10-CM | POA: Diagnosis not present

## 2021-03-31 DIAGNOSIS — H838X3 Other specified diseases of inner ear, bilateral: Secondary | ICD-10-CM | POA: Diagnosis not present

## 2021-05-18 DIAGNOSIS — Z23 Encounter for immunization: Secondary | ICD-10-CM | POA: Diagnosis not present

## 2021-05-25 ENCOUNTER — Other Ambulatory Visit (INDEPENDENT_AMBULATORY_CARE_PROVIDER_SITE_OTHER): Payer: Self-pay | Admitting: Nurse Practitioner

## 2021-05-25 ENCOUNTER — Encounter: Payer: Self-pay | Admitting: Internal Medicine

## 2021-05-30 DIAGNOSIS — E210 Primary hyperparathyroidism: Secondary | ICD-10-CM | POA: Diagnosis not present

## 2021-05-31 LAB — PTH, INTACT AND CALCIUM
Calcium: 11.1 mg/dL — ABNORMAL HIGH (ref 8.6–10.2)
PTH: 49 pg/mL (ref 15–65)

## 2021-05-31 LAB — VITAMIN D 25 HYDROXY (VIT D DEFICIENCY, FRACTURES): Vit D, 25-Hydroxy: 58 ng/mL (ref 30.0–100.0)

## 2021-06-01 ENCOUNTER — Ambulatory Visit (INDEPENDENT_AMBULATORY_CARE_PROVIDER_SITE_OTHER): Payer: Medicare Other | Admitting: Internal Medicine

## 2021-06-01 ENCOUNTER — Other Ambulatory Visit: Payer: Self-pay

## 2021-06-01 ENCOUNTER — Encounter: Payer: Self-pay | Admitting: Internal Medicine

## 2021-06-01 VITALS — BP 150/74 | HR 57 | Temp 98.7°F | Resp 18 | Ht 69.0 in | Wt 177.0 lb

## 2021-06-01 DIAGNOSIS — F411 Generalized anxiety disorder: Secondary | ICD-10-CM

## 2021-06-01 DIAGNOSIS — Z7689 Persons encountering health services in other specified circumstances: Secondary | ICD-10-CM | POA: Diagnosis not present

## 2021-06-01 DIAGNOSIS — E210 Primary hyperparathyroidism: Secondary | ICD-10-CM

## 2021-06-01 DIAGNOSIS — N401 Enlarged prostate with lower urinary tract symptoms: Secondary | ICD-10-CM | POA: Diagnosis not present

## 2021-06-01 DIAGNOSIS — Z23 Encounter for immunization: Secondary | ICD-10-CM

## 2021-06-01 DIAGNOSIS — E782 Mixed hyperlipidemia: Secondary | ICD-10-CM | POA: Diagnosis not present

## 2021-06-01 DIAGNOSIS — G609 Hereditary and idiopathic neuropathy, unspecified: Secondary | ICD-10-CM | POA: Diagnosis not present

## 2021-06-01 DIAGNOSIS — Z86711 Personal history of pulmonary embolism: Secondary | ICD-10-CM

## 2021-06-01 DIAGNOSIS — R350 Frequency of micturition: Secondary | ICD-10-CM | POA: Diagnosis not present

## 2021-06-01 DIAGNOSIS — I10 Essential (primary) hypertension: Secondary | ICD-10-CM

## 2021-06-01 DIAGNOSIS — R739 Hyperglycemia, unspecified: Secondary | ICD-10-CM | POA: Diagnosis not present

## 2021-06-01 MED ORDER — VENLAFAXINE HCL ER 150 MG PO CP24: 150.0000 mg | ORAL_CAPSULE | Freq: Every day | ORAL | 1 refills | Status: DC

## 2021-06-01 MED ORDER — TAMSULOSIN HCL 0.4 MG PO CAPS: 0.4000 mg | ORAL_CAPSULE | Freq: Every day | ORAL | 1 refills | Status: DC

## 2021-06-01 NOTE — Assessment & Plan Note (Signed)
Care established Previous chart reviewed History and medications reviewed with the patient 

## 2021-06-01 NOTE — Progress Notes (Signed)
New Patient Office Visit  Subjective:  Patient ID: Billy Hunt, male    DOB: 08-29-1946  Age: 75 y.o. MRN: [id number]  CC:  Chief Complaint  Patient presents with   New Patient (Initial Visit)    Establish care No concerns    HPI Billy Hunt is a 75 y.o. male with PMH of hyperparathyroidism, osteopenia, BPH, PE, HLD and anxiety who presents for establishing care.  His BP was elevated in the office today on multiple measurements, but he does not have history of HTN.  His BP has been stable during previous office visits at other providers.  He takes tamsulosin for BPH and states that he had run out of it.  He takes Effexor for anxiety, which is well controlled.  He denies any anhedonia, SI or HI.  He has history of peripheral neuropathy, for which she takes gabapentin. He has intermittent numbness of the LE. He denies any tingling or weakness of the LE currently.  He is up-to-date with COVID vaccine. He received flu vaccine in the office today.  Past Medical History:  Diagnosis Date   Anxiety    BPH (benign prostatic hyperplasia)    Gout    Hypercholesterolemia    Hyperparathyroidism ([place])    Hypertension    Pulmonary emboli (HCC)    Vitamin D deficiency    Vitamin D deficiency disease 10/07/2019    Past Surgical History:  Procedure Laterality Date   PERIPHERAL VASCULAR THROMBECTOMY     TRANSURETHRAL RESECTION OF PROSTATE     VASECTOMY      Family History  Problem Relation Age of Onset   Alzheimer's disease Mother        Vascular Dementia   Stroke Mother    Breast cancer Mother    Cancer Father        Multiple Myeloma   Hypertension Father    Deep vein thrombosis Brother    Heart disease Brother    Cancer Paternal Grandmother    Alcohol abuse Paternal Grandfather     Social History   Socioeconomic History   Marital status: Single    Spouse name: Not on file   Number of children: Not on file   Years of education: Not on file   Highest education  level: Not on file  Occupational History   Not on file  Tobacco Use   Smoking status: Former    Packs/day: 1.00    Years: 10.00    Pack years: 10.00    Types: Cigarettes    Quit date: 04/21/1971    Years since quitting: 50.1   Smokeless tobacco: Never  Vaping Use   Vaping Use: Never used  Substance and Sexual Activity   Alcohol use: No    Comment: former   Drug use: Yes    Types: Marijuana    Comment: last use: 9/30   Sexual activity: Yes    Birth control/protection: Other-see comments  Other Topics Concern   Not on file  Social History Narrative   Not on file   Social Determinants of Health   Financial Resource Strain: Not on file  Food Insecurity: Not on file  Transportation Needs: Not on file  Physical Activity: Not on file  Stress: Not on file  Social Connections: Not on file  Intimate Partner Violence: Not on file    ROS Review of Systems  Constitutional:  Negative for chills and fever.  HENT:  Negative for congestion and sore throat.   Eyes:  Negative for pain  and discharge.  Respiratory:  Negative for cough and shortness of breath.   Cardiovascular:  Negative for chest pain and palpitations.  Gastrointestinal:  Negative for constipation, diarrhea, nausea and vomiting.  Endocrine: Negative for polydipsia and polyuria.  Genitourinary:  Negative for dysuria and hematuria.  Musculoskeletal:  Negative for neck pain and neck stiffness.  Skin:  Negative for rash.  Neurological:  Positive for numbness. Negative for dizziness, weakness and headaches.  Psychiatric/Behavioral:  Negative for agitation and behavioral problems.    Objective:   Today's Vitals: BP (!) 150/74 (BP Location: Left Arm, Cuff Size: Normal)   Pulse (!) 57   Temp 98.7 F (37.1 C) (Oral)   Resp 18   Ht 5' 9"  (1.753 m)   Wt 177 lb 0.6 oz (80.3 kg)   SpO2 95%   BMI 26.14 kg/m   Physical Exam Vitals reviewed.  Constitutional:      General: He is not in acute distress.    Appearance: He  is not diaphoretic.  HENT:     Head: Normocephalic and atraumatic.     Nose: Nose normal.     Mouth/Throat:     Mouth: Mucous membranes are moist.  Eyes:     General: No scleral icterus.    Extraocular Movements: Extraocular movements intact.  Cardiovascular:     Rate and Rhythm: Normal rate and regular rhythm.     Pulses: Normal pulses.     Heart sounds: Normal heart sounds. No murmur heard. Pulmonary:     Breath sounds: Normal breath sounds. No wheezing or rales.  Musculoskeletal:     Cervical back: Neck supple. No tenderness.     Right lower leg: No edema.     Left lower leg: No edema.  Skin:    General: Skin is warm.     Findings: No rash.  Neurological:     General: No focal deficit present.     Mental Status: He is alert and oriented to person, place, and time.  Psychiatric:        Mood and Affect: Mood normal.        Behavior: Behavior normal.    Assessment & Plan:   Problem List Items Addressed This Visit       Encounter to establish care - Primary   Care established Previous chart reviewed History and medications reviewed with the patient     Relevant Orders  CBC with Differential/Platelet    Cardiovascular and Mediastinum   HTN (hypertension)    BP Readings from Last 1 Encounters:  06/01/21 (!) 150/74  New onset BP has been WNL during previous office visits at other providers, will recheck, if persistently elevated, will add losartan Advised DASH diet and moderate exercise/walking, at least 150 mins/week      Relevant Orders   CBC with Differential/Platelet     Endocrine   Hyperparathyroidism, primary ([place])    Stable Followed by Dr Dorris Fetch        Genitourinary   BPH (benign prostatic hyperplasia)    S/p TURP Denies PSA screening On Tamsulosin, refilled      Relevant Medications   tamsulosin (FLOMAX) 0.4 MG CAPS capsule     Other   History of pulmonary embolus (PE)    Unclear etiology, could be due to hypercoagulable state due to  testosterone injections in the past      Hyperlipidemia    Last lipid profile checked (2020), was on statin in the past Check lipid profile  Relevant Orders   Lipid panel      GAD (generalized anxiety disorder)    Well-controlled with Effexor      Relevant Medications   venlafaxine XR (EFFEXOR-XR) 150 MG 24 hr capsule   Other Visit Diagnoses     Need for immunization against influenza       Relevant Orders   Flu Vaccine QUAD High Dose(Fluad) (Completed)   Hyperglycemia       Relevant Orders   CMP14+EGFR   Hemoglobin A1c       Outpatient Encounter Medications as of 06/01/2021  Medication Sig   cholecalciferol (VITAMIN D3) 25 MCG (1000 UT) tablet Take 2,000 Units by mouth daily.    gabapentin (NEURONTIN) 300 MG capsule Take 300 mg by mouth 3 (three) times daily.   [DISCONTINUED] tamsulosin (FLOMAX) 0.4 MG CAPS capsule Take 1 capsule (0.4 mg total) by mouth daily.   [DISCONTINUED] venlafaxine XR (EFFEXOR-XR) 150 MG 24 hr capsule Take 1 capsule (150 mg total) by mouth daily. No more refills will be approved by this provider as office is now permanently closed, patient will need to find another primary care provider.   tamsulosin (FLOMAX) 0.4 MG CAPS capsule Take 1 capsule (0.4 mg total) by mouth daily.   venlafaxine XR (EFFEXOR-XR) 150 MG 24 hr capsule Take 1 capsule (150 mg total) by mouth daily.   [DISCONTINUED] predniSONE (DELTASONE) 20 MG tablet Take 2 tablets (40 mg total) by mouth daily with breakfast. (Patient not taking: Reported on 06/01/2021)   [DISCONTINUED] dexamethasone (DECADRON) injection 4 mg    No facility-administered encounter medications on file as of 06/01/2021.    Follow-up: Return in about 6 weeks (around 07/13/2021) for HTN.   Lindell Spar, MD

## 2021-06-01 NOTE — Assessment & Plan Note (Signed)
Unclear etiology, could be due to hypercoagulable state due to testosterone injections in the past

## 2021-06-01 NOTE — Patient Instructions (Signed)
Please continue taking medications as prescribed.  Please get fasting blood tests done before the next visit.  Please continue to follow DASH diet and perform moderate exercise/walking at least 150 mins/week.

## 2021-06-01 NOTE — Assessment & Plan Note (Addendum)
BP Readings from Last 1 Encounters:  06/01/21 (!) 150/74   New onset BP has been WNL during previous office visits at other providers, will recheck, if persistently elevated, will add losartan Advised DASH diet and moderate exercise/walking, at least 150 mins/week

## 2021-06-01 NOTE — Assessment & Plan Note (Signed)
Stable Followed by Dr Fransico Him

## 2021-06-01 NOTE — Assessment & Plan Note (Signed)
S/p TURP Denies PSA screening On Tamsulosin, refilled

## 2021-06-01 NOTE — Assessment & Plan Note (Signed)
Well-controlled with Effexor 

## 2021-06-01 NOTE — Assessment & Plan Note (Signed)
Last lipid profile checked (2020), was on statin in the past Check lipid profile

## 2021-06-01 NOTE — Assessment & Plan Note (Signed)
Takes gabapentin 300 mg 3 times daily

## 2021-06-08 ENCOUNTER — Other Ambulatory Visit: Payer: Self-pay

## 2021-06-08 ENCOUNTER — Ambulatory Visit (INDEPENDENT_AMBULATORY_CARE_PROVIDER_SITE_OTHER): Payer: Medicare Other | Admitting: "Endocrinology

## 2021-06-08 ENCOUNTER — Encounter: Payer: Self-pay | Admitting: "Endocrinology

## 2021-06-08 VITALS — BP 142/74 | HR 60 | Ht 69.0 in | Wt 177.0 lb

## 2021-06-08 DIAGNOSIS — E210 Primary hyperparathyroidism: Secondary | ICD-10-CM | POA: Diagnosis not present

## 2021-06-08 MED ORDER — CINACALCET HCL 30 MG PO TABS: 30.0000 mg | ORAL_TABLET | Freq: Every day | ORAL | 1 refills | Status: DC

## 2021-06-08 NOTE — Progress Notes (Signed)
06/08/2021, 12:52 PM  Endocrinology follow-up note   Billy Hunt is a 75 y.o.-year-old male, who is returning for follow-up after he was seen in consultation for hypercalcemia/hyperparathyroidism, osteopenia. PMD:   Lindell Spar, MD   Past Medical History:  Diagnosis Date   Anxiety    BPH (benign prostatic hyperplasia)    Gout    Hypercholesterolemia    Hyperparathyroidism ([place])    Hypertension    Pulmonary emboli ([place])    Vitamin D deficiency    Vitamin D deficiency disease 10/07/2019    Past Surgical History:  Procedure Laterality Date   PERIPHERAL VASCULAR THROMBECTOMY     TRANSURETHRAL RESECTION OF PROSTATE     VASECTOMY      Social History   Tobacco Use   Smoking status: Former    Packs/day: 1.00    Years: 10.00    Pack years: 10.00    Types: Cigarettes    Quit date: 04/21/1971    Years since quitting: 50.1   Smokeless tobacco: Never  Vaping Use   Vaping Use: Never used  Substance Use Topics   Alcohol use: No    Comment: former   Drug use: Yes    Types: Marijuana    Comment: last use: 9/30    Family History  Problem Relation Age of Onset   Alzheimer's disease Mother        Vascular Dementia   Stroke Mother    Breast cancer Mother    Cancer Father        Multiple Myeloma   Hypertension Father    Deep vein thrombosis Brother    Heart disease Brother    Cancer Paternal Grandmother    Alcohol abuse Paternal Grandfather     Outpatient Encounter Medications as of 06/08/2021  Medication Sig   cinacalcet (SENSIPAR) 30 MG tablet Take 1 tablet (30 mg total) by mouth daily with breakfast.   cholecalciferol (VITAMIN D3) 25 MCG (1000 UT) tablet Take 2,000 Units by mouth daily.    gabapentin (NEURONTIN) 300 MG capsule Take 300 mg by mouth 3 (three) times daily.   tamsulosin (FLOMAX) 0.4 MG CAPS capsule Take 1 capsule (0.4 mg total) by mouth daily.   venlafaxine XR (EFFEXOR-XR) 150 MG 24 hr capsule Take 1 capsule (150 mg total) by mouth daily.    No facility-administered encounter medications on file as of 06/08/2021.    Allergies  Allergen Reactions   Sulfa Antibiotics Hives   Sulfamethoxazole Rash     HPI  Billy Hunt reports that he was diagnosed with hypercalcemia approximately 6 years ago with blood test showing high PTH and high calcium.  He denies any history of nephrolithiasis, seizure disorder, cardiac dysrhythmias.  Denies any history of CKD.   -He however was found to have osteopenia in 2014.  His repeat bone density in [facility] on November 25, 2018 showed left femur osteopenia with a T score of -1.6, left forearm radius 33% T score of -0.2, spine excluded due to degenerative changes.   His most recent bone density on November 26, 2020 shows similar findings still consistent with osteopenia.  -He is currently not on calcium supplements, however on vitamin D3 2000 units daily.   -He denies any family history of parathyroid, thyroid, pituitary, adrenal dysfunction.   -Prior to his last visit , labs show PTH of 72 improving from 130, calcium stable at 10.6 unchanged from 10.9 during his last visit, still significantly better than his maximum  calcium of 11.4 2 years ago.    However his previsit labs this time show calcium higher at 11.1, PTH 49, vitamin D 58.   - prior to his last visit, 24-hour urine calcium on November 22, 2018 was 221. He has no new complaints today.  No prior history of fragility fractures or falls.    He is a former  smoker.   he is not on HCTZ or other thiazide therapy.       BP (!) 142/74   Pulse 60   Ht 5' 9"  (1.753 m)   Wt 177 lb (80.3 kg)   BMI 26.14 kg/m , Body mass index is 26.14 kg/m.   CMP     Component Value Date/Time   NA 138 11/29/2020 0929   K 4.6 11/29/2020 0929   CL 103 11/29/2020 0929   CO2 21 11/29/2020 0929   GLUCOSE 99 11/29/2020 0929   GLUCOSE 111 (H) 07/27/2017 0100   BUN 18 11/29/2020 0929   CREATININE 0.86 11/29/2020 0929   CALCIUM 11.1 (H)  05/30/2021 0831   PROT 7.5 11/29/2020 0929   ALBUMIN 4.4 11/29/2020 0929   AST 16 11/29/2020 0929   ALT 11 11/29/2020 0929   ALKPHOS 75 11/29/2020 0929   BILITOT 0.4 11/29/2020 0929   GFRNONAA >60 07/27/2017 0100   GFRAA >60 07/27/2017 0100   Most recent official lab reports from September 17, 2018: 25-hydroxy vitamin D 43, PTH 76 elevated, calcium 11.4 mg per DL elevated, BUN 25, creatinine 0.98. -He came with separate handwritten summaries of his labs from 2014 showing PTH elevated between 89-1 67 between May and June 2014, 25 hydroxy vitamin D ranging between 8.6-27 between June and August 2014, phosphorus range from 2.2-2.3 between May and August 2014, 24-hour urine calcium 192 mg in 24 hours in June 2014. DEXA scan showed mild osteopenia in June 2014.   Surveillance DEXA on November 25, 2018  DualFemur Neck Left 11/25/2018 70.4 N/A -1.6 0.868 g/cm2   Left Forearm Radius 33% 11/25/2018 70.4 N/A -0.2 0.787 g/cm2   ASSESSMENT: BMD as determined from Femur Neck Left is 0.868 g/cm2 with a T-Score of -1.6.   This patient is considered OSTEOPENIC according to the [facility]) criteria.    Repeat bone density on November 26, 2020: DualFemur Neck Left 11/26/2020 72.4 N/A -1.6 0.857 g/cm2 -1.3% - DualFemur Neck Left 11/25/2018 70.4 N/A -1.6 0.868 g/cm2 - -   DualFemur Total Mean 11/26/2020 72.4 N/A -1.3 0.914 g/cm2 -6.3% Yes DualFemur Total Mean 11/25/2018 70.4 N/A -0.9 0.975 g/cm2 - -   Left Forearm Radius 33% 11/26/2020 72.4 N/A -0.5 0.768 g/cm2 -2.3% - Left Forearm Radius 33% 11/25/2018 70.4 N/A -0.2 0.787 g/cm2 - -   ASSESSMENT: BMD as determined from Femur Neck Left is 0.857 g/cm2 with aT-score of -1.6.   This patient is considered OSTEOPENIC according to [facility]) criteria.  Recent Results (from the past 2160 hour(s))  PTH, intact and calcium     Status: Abnormal   Collection Time: 05/30/21  8:31 AM  Result Value Ref Range   Calcium  11.1 (H) 8.6 - 10.2 mg/dL   PTH 49 15 - 65 pg/mL   PTH Interp Comment     Comment: Interpretation                 Intact PTH    Calcium                                 (  pg/mL)      (mg/dL) Normal                          15 - 65     8.6 - 10.2 Primary Hyperparathyroidism         >65          >10.2 Secondary Hyperparathyroidism       >65          <10.2 Non-Parathyroid Hypercalcemia       <65          >10.2 Hypoparathyroidism                  <15          < 8.6 Non-Parathyroid Hypocalcemia    15 - 65          < 8.6   VITAMIN D 25 Hydroxy (Vit-D Deficiency, Fractures)     Status: None   Collection Time: 05/30/21  8:31 AM  Result Value Ref Range   Vit D, 25-Hydroxy 58.0 30.0 - 100.0 ng/mL    Comment: Vitamin D deficiency has been defined by the [place] and an Endocrine Society practice guideline as a level of serum 25-OH vitamin D less than 20 ng/mL (1,2). The Endocrine Society went on to further define vitamin D insufficiency as a level between 21 and 29 ng/mL (2). 1. IOM (Institute of Medicine). 2010. Dietary reference    intakes for calcium and D. [place]: The    [employer]. 2. Holick MF, Binkley [place], Bischoff-Ferrari HA, et al.    Evaluation, treatment, and prevention of vitamin D    deficiency: an Endocrine Society clinical practice    guideline. JCEM. 2011 Jul; 96(7):1911-30.     Assessment: 1. Hypercalcemia / Hyperparathyroidism 2.  Osteopenia  Plan: His previsit repeat labs show PTH improving to 49, however his calcium is higher at 11.1.   His bone density is still consistent with osteopenia of femur.  His lumbar spine was excluded due to degenerative joint diseases. His repeat 24-hour urine calcium is 221.  This is still consistent with primary hyperparathyroidism at a mild form.  He does not have criteria for considering surgery at this time.   -He is on vitamin D supplement, currently therapeutic at 59.    He is advised to continue  vitamin D3  2000 units daily, advised to stay away from calcium supplements.  -He is now vitamin D replete.  He will benefit from intervention.  He is referred intervention with Sensipar in lieu of surgery for now. He favors nonsurgical approach for now.  I discussed and prescribe Sensipar 30 mg p.o. daily at breakfast with plan to repeat labs in 4 months. He will continue to benefit from vitamin D supplement, currently 2000 units daily. -He is advised to continue his primary care follow-up with Dr. Posey Pronto.    I spent 25 minutes in the care of the patient today including review of labs from Thyroid Function, CMP, and other relevant labs ; imaging/biopsy records (current and previous including abstractions from other facilities); face-to-face time discussing  his lab results and symptoms, medications doses, his options of short and long term treatment based on the latest standards of care / guidelines;   and documenting the encounter.  Veronda Prude  participated in the discussions, expressed understanding, and voiced agreement with the above plans.  All questions were answered to his satisfaction. he is encouraged to contact  clinic should he have any questions or concerns prior to his return visit.    - Return in about 4 months (around 10/08/2021) for F/U with Pre-visit Labs.   Glade Lloyd, MD [facility] [address] [place], [place] [address] Phone: [phone]  Fax: [phone]    This note was partially dictated with voice recognition software. Similar sounding words can be transcribed inadequately or may not  be corrected upon review.  06/08/2021, 12:52 PM

## 2021-06-09 ENCOUNTER — Other Ambulatory Visit: Payer: Self-pay | Admitting: "Endocrinology

## 2021-06-09 MED ORDER — CINACALCET HCL 30 MG PO TABS: 30.0000 mg | ORAL_TABLET | Freq: Every day | ORAL | 3 refills | Status: DC

## 2021-07-04 ENCOUNTER — Inpatient Hospital Stay (HOSPITAL_COMMUNITY)
Admission: EM | Admit: 2021-07-04 | Discharge: 2021-07-06 | DRG: 247 | Disposition: A | Discharge status: Home / Self Care | Payer: Medicare Other | Attending: Internal Medicine | Admitting: Internal Medicine

## 2021-07-04 ENCOUNTER — Encounter (HOSPITAL_COMMUNITY): Payer: Self-pay | Admitting: Emergency Medicine

## 2021-07-04 ENCOUNTER — Emergency Department (HOSPITAL_COMMUNITY): Payer: Medicare Other

## 2021-07-04 ENCOUNTER — Observation Stay (HOSPITAL_BASED_OUTPATIENT_CLINIC_OR_DEPARTMENT_OTHER): Payer: Medicare Other

## 2021-07-04 ENCOUNTER — Encounter (HOSPITAL_COMMUNITY)
Admission: EM | Disposition: A | Discharge status: Home / Self Care | Payer: Self-pay | Source: Home / Self Care | Attending: Internal Medicine

## 2021-07-04 ENCOUNTER — Other Ambulatory Visit: Payer: Self-pay

## 2021-07-04 DIAGNOSIS — Z955 Presence of coronary angioplasty implant and graft: Secondary | ICD-10-CM

## 2021-07-04 DIAGNOSIS — R509 Fever, unspecified: Secondary | ICD-10-CM | POA: Diagnosis not present

## 2021-07-04 DIAGNOSIS — G629 Polyneuropathy, unspecified: Secondary | ICD-10-CM | POA: Diagnosis present

## 2021-07-04 DIAGNOSIS — Z86718 Personal history of other venous thrombosis and embolism: Secondary | ICD-10-CM

## 2021-07-04 DIAGNOSIS — Z8249 Family history of ischemic heart disease and other diseases of the circulatory system: Secondary | ICD-10-CM | POA: Diagnosis not present

## 2021-07-04 DIAGNOSIS — Z882 Allergy status to sulfonamides status: Secondary | ICD-10-CM

## 2021-07-04 DIAGNOSIS — I4510 Unspecified right bundle-branch block: Secondary | ICD-10-CM | POA: Diagnosis present

## 2021-07-04 DIAGNOSIS — E210 Primary hyperparathyroidism: Secondary | ICD-10-CM | POA: Diagnosis present

## 2021-07-04 DIAGNOSIS — N400 Enlarged prostate without lower urinary tract symptoms: Secondary | ICD-10-CM | POA: Diagnosis present

## 2021-07-04 DIAGNOSIS — I214 Non-ST elevation (NSTEMI) myocardial infarction: Secondary | ICD-10-CM

## 2021-07-04 DIAGNOSIS — R739 Hyperglycemia, unspecified: Secondary | ICD-10-CM | POA: Diagnosis not present

## 2021-07-04 DIAGNOSIS — Z86711 Personal history of pulmonary embolism: Secondary | ICD-10-CM | POA: Diagnosis not present

## 2021-07-04 DIAGNOSIS — R030 Elevated blood-pressure reading, without diagnosis of hypertension: Secondary | ICD-10-CM | POA: Diagnosis not present

## 2021-07-04 DIAGNOSIS — D72829 Elevated white blood cell count, unspecified: Secondary | ICD-10-CM | POA: Diagnosis not present

## 2021-07-04 DIAGNOSIS — J449 Chronic obstructive pulmonary disease, unspecified: Secondary | ICD-10-CM | POA: Diagnosis not present

## 2021-07-04 DIAGNOSIS — E785 Hyperlipidemia, unspecified: Secondary | ICD-10-CM | POA: Diagnosis present

## 2021-07-04 DIAGNOSIS — Z20822 Contact with and (suspected) exposure to covid-19: Secondary | ICD-10-CM | POA: Diagnosis present

## 2021-07-04 DIAGNOSIS — I10 Essential (primary) hypertension: Secondary | ICD-10-CM | POA: Diagnosis not present

## 2021-07-04 DIAGNOSIS — M109 Gout, unspecified: Secondary | ICD-10-CM | POA: Diagnosis not present

## 2021-07-04 DIAGNOSIS — I2510 Atherosclerotic heart disease of native coronary artery without angina pectoris: Secondary | ICD-10-CM

## 2021-07-04 DIAGNOSIS — I249 Acute ischemic heart disease, unspecified: Secondary | ICD-10-CM | POA: Diagnosis present

## 2021-07-04 DIAGNOSIS — E782 Mixed hyperlipidemia: Secondary | ICD-10-CM | POA: Diagnosis not present

## 2021-07-04 DIAGNOSIS — I25110 Atherosclerotic heart disease of native coronary artery with unstable angina pectoris: Secondary | ICD-10-CM | POA: Diagnosis not present

## 2021-07-04 DIAGNOSIS — Z87891 Personal history of nicotine dependence: Secondary | ICD-10-CM

## 2021-07-04 DIAGNOSIS — R7309 Other abnormal glucose: Secondary | ICD-10-CM

## 2021-07-04 DIAGNOSIS — R079 Chest pain, unspecified: Secondary | ICD-10-CM | POA: Diagnosis not present

## 2021-07-04 DIAGNOSIS — E213 Hyperparathyroidism, unspecified: Secondary | ICD-10-CM | POA: Diagnosis not present

## 2021-07-04 DIAGNOSIS — Z79899 Other long term (current) drug therapy: Secondary | ICD-10-CM | POA: Diagnosis not present

## 2021-07-04 DIAGNOSIS — I160 Hypertensive urgency: Secondary | ICD-10-CM | POA: Diagnosis present

## 2021-07-04 HISTORY — PX: CORONARY STENT INTERVENTION: CATH118234

## 2021-07-04 HISTORY — DX: Acute ischemic heart disease, unspecified: I24.9

## 2021-07-04 HISTORY — PX: LEFT HEART CATH AND CORONARY ANGIOGRAPHY: CATH118249

## 2021-07-04 LAB — BASIC METABOLIC PANEL
Anion gap: 12 (ref 5–15)
BUN: 18 mg/dL (ref 8–23)
CO2: 24 mmol/L (ref 22–32)
Calcium: 10.8 mg/dL — ABNORMAL HIGH (ref 8.9–10.3)
Chloride: 102 mmol/L (ref 98–111)
Creatinine, Ser: 0.91 mg/dL (ref 0.61–1.24)
GFR, Estimated: >60 mL/min (ref >60)
Glucose, Bld: 142 mg/dL — ABNORMAL HIGH (ref 70–99)
Potassium: 3.7 mmol/L (ref 3.5–5.1)
Sodium: 138 mmol/L (ref 135–145)

## 2021-07-04 LAB — CBC WITH DIFFERENTIAL/PLATELET
Abs Immature Granulocytes: 0.03 10*3/uL (ref 0.00–0.07)
Basophils Absolute: 0.1 10*3/uL (ref 0.0–0.1)
Basophils Relative: 1 %
Eosinophils Absolute: 0.1 10*3/uL (ref 0.0–0.5)
Eosinophils Relative: 1 %
HCT: 51.3 % (ref 39.0–52.0)
Hemoglobin: 17.2 g/dL — ABNORMAL HIGH (ref 13.0–17.0)
Immature Granulocytes: 0 %
Lymphocytes Relative: 24 %
Lymphs Abs: 2.7 10*3/uL (ref 0.7–4.0)
MCH: 31.4 pg (ref 26.0–34.0)
MCHC: 33.5 g/dL (ref 30.0–36.0)
MCV: 93.8 fL (ref 80.0–100.0)
Monocytes Absolute: 0.9 10*3/uL (ref 0.1–1.0)
Monocytes Relative: 9 %
Neutro Abs: 7.3 10*3/uL (ref 1.7–7.7)
Neutrophils Relative %: 65 %
Platelets: 235 10*3/uL (ref 150–400)
RBC: 5.47 MIL/uL (ref 4.22–5.81)
RDW: 12.7 % (ref 11.5–15.5)
WBC: 11.1 10*3/uL — ABNORMAL HIGH (ref 4.0–10.5)
nRBC: 0 % (ref 0.0–0.2)

## 2021-07-04 LAB — ECHOCARDIOGRAM COMPLETE
Area-P 1/2: 4.06 cm2
Height: 69 in
P 1/2 time: 561 msec
S' Lateral: 3.7 cm
Weight: 2800 oz

## 2021-07-04 LAB — RESP PANEL BY RT-PCR (FLU A&B, COVID) ARPGX2
Influenza A by PCR: NEGATIVE
Influenza B by PCR: NEGATIVE
SARS Coronavirus 2 by RT PCR: NEGATIVE

## 2021-07-04 LAB — TROPONIN I (HIGH SENSITIVITY)
Troponin I (High Sensitivity): 429 ng/L (ref <18)
Troponin I (High Sensitivity): 386 ng/L (ref <18)

## 2021-07-04 LAB — POCT ACTIVATED CLOTTING TIME
Activated Clotting Time: 271 seconds
Activated Clotting Time: 294 seconds

## 2021-07-04 LAB — HEPARIN LEVEL (UNFRACTIONATED): Heparin Unfractionated: 0.38 IU/mL (ref 0.30–0.70)

## 2021-07-04 SURGERY — LEFT HEART CATH AND CORONARY ANGIOGRAPHY
Anesthesia: LOCAL

## 2021-07-04 MED ORDER — NITROGLYCERIN 1 MG/10 ML FOR IR/CATH LAB
INTRA_ARTERIAL | Status: DC | PRN
  Administered 2021-07-04 (×3): 200 ug via INTRACORONARY

## 2021-07-04 MED ORDER — GABAPENTIN 300 MG PO CAPS
300.0000 mg | ORAL_CAPSULE | Freq: Three times a day (TID) | ORAL | Status: DC
  Administered 2021-07-04 – 2021-07-06 (×5): 300 mg via ORAL
  Filled 2021-07-04 (×5): qty 1

## 2021-07-04 MED ORDER — NITROGLYCERIN 0.4 MG SL SUBL
0.4000 mg | SUBLINGUAL_TABLET | SUBLINGUAL | Status: AC | PRN
Start: 2021-07-04 — End: 2021-07-04
  Administered 2021-07-04 (×3): 0.4 mg via SUBLINGUAL
  Filled 2021-07-04: qty 1

## 2021-07-04 MED ORDER — VERAPAMIL HCL 2.5 MG/ML IV SOLN
INTRAVENOUS | Status: DC | PRN
  Administered 2021-07-04: 10 mL via INTRA_ARTERIAL

## 2021-07-04 MED ORDER — ALUM & MAG HYDROXIDE-SIMETH 200-200-20 MG/5ML PO SUSP
30.0000 mL | Freq: Once | ORAL | Status: AC
  Administered 2021-07-04: 30 mL via ORAL
  Filled 2021-07-04: qty 30

## 2021-07-04 MED ORDER — ONDANSETRON HCL 4 MG/2ML IJ SOLN
4.0000 mg | Freq: Once | INTRAMUSCULAR | Status: AC
  Administered 2021-07-04: 4 mg via INTRAVENOUS
  Filled 2021-07-04: qty 2

## 2021-07-04 MED ORDER — MIDAZOLAM HCL 2 MG/2ML IJ SOLN
INTRAMUSCULAR | Status: AC
  Filled 2021-07-04: qty 2

## 2021-07-04 MED ORDER — HEPARIN BOLUS VIA INFUSION
4000.0000 [IU] | Freq: Once | INTRAVENOUS | Status: AC
  Administered 2021-07-04: 4000 [IU] via INTRAVENOUS

## 2021-07-04 MED ORDER — ATORVASTATIN CALCIUM 80 MG PO TABS
80.0000 mg | ORAL_TABLET | Freq: Every day | ORAL | Status: DC
Start: 2021-07-04 — End: 2021-07-06
  Administered 2021-07-04 – 2021-07-05 (×2): 80 mg via ORAL
  Filled 2021-07-04 (×3): qty 1

## 2021-07-04 MED ORDER — HEPARIN SODIUM (PORCINE) 1000 UNIT/ML IJ SOLN
INTRAMUSCULAR | Status: DC | PRN
  Administered 2021-07-04: 4000 [IU] via INTRAVENOUS
  Administered 2021-07-04: 2000 [IU] via INTRAVENOUS
  Administered 2021-07-04: 4000 [IU] via INTRAVENOUS

## 2021-07-04 MED ORDER — ONDANSETRON HCL 4 MG/2ML IJ SOLN
4.0000 mg | Freq: Four times a day (QID) | INTRAMUSCULAR | Status: DC | PRN
  Administered 2021-07-04 – 2021-07-05 (×2): 4 mg via INTRAVENOUS
  Filled 2021-07-04: qty 2

## 2021-07-04 MED ORDER — ACETAMINOPHEN 325 MG PO TABS
650.0000 mg | ORAL_TABLET | ORAL | Status: DC | PRN
  Administered 2021-07-04: 650 mg via ORAL
  Filled 2021-07-04: qty 2

## 2021-07-04 MED ORDER — ONDANSETRON HCL 4 MG/2ML IJ SOLN
INTRAMUSCULAR | Status: AC
  Filled 2021-07-04: qty 2

## 2021-07-04 MED ORDER — ASPIRIN 300 MG RE SUPP: 300.0000 mg | RECTAL | Status: AC

## 2021-07-04 MED ORDER — ASPIRIN 81 MG PO CHEW: 81.0000 mg | CHEWABLE_TABLET | ORAL | Status: DC

## 2021-07-04 MED ORDER — NITROGLYCERIN 1 MG/10 ML FOR IR/CATH LAB
INTRA_ARTERIAL | Status: AC
  Filled 2021-07-04: qty 10

## 2021-07-04 MED ORDER — ASPIRIN EC 81 MG PO TBEC
81.0000 mg | DELAYED_RELEASE_TABLET | Freq: Every day | ORAL | Status: DC
  Administered 2021-07-05 – 2021-07-06 (×2): 81 mg via ORAL
  Filled 2021-07-04 (×2): qty 1

## 2021-07-04 MED ORDER — LIDOCAINE HCL (PF) 1 % IJ SOLN
INTRAMUSCULAR | Status: DC | PRN
  Administered 2021-07-04: 2 mL

## 2021-07-04 MED ORDER — HYDRALAZINE HCL 20 MG/ML IJ SOLN
10.0000 mg | INTRAMUSCULAR | Status: AC | PRN
  Administered 2021-07-04: 10 mg via INTRAVENOUS
  Filled 2021-07-04: qty 1

## 2021-07-04 MED ORDER — CANGRELOR TETRASODIUM 50 MG IV SOLR
INTRAVENOUS | Status: AC
  Filled 2021-07-04: qty 50

## 2021-07-04 MED ORDER — SODIUM CHLORIDE 0.9 % WEIGHT BASED INFUSION: 1.0000 mL/kg/h | INTRAVENOUS | Status: DC

## 2021-07-04 MED ORDER — SODIUM CHLORIDE 0.9 % IV SOLN
INTRAVENOUS | Status: AC | PRN
  Administered 2021-07-04: 4 ug/kg/min via INTRAVENOUS

## 2021-07-04 MED ORDER — SODIUM CHLORIDE 0.9 % IV SOLN: INTRAVENOUS | Status: AC

## 2021-07-04 MED ORDER — SODIUM CHLORIDE 0.9% FLUSH: 3.0000 mL | Freq: Two times a day (BID) | INTRAVENOUS | Status: DC

## 2021-07-04 MED ORDER — MIDAZOLAM HCL 2 MG/2ML IJ SOLN
INTRAMUSCULAR | Status: DC | PRN
  Administered 2021-07-04 (×2): 1 mg via INTRAVENOUS

## 2021-07-04 MED ORDER — VERAPAMIL HCL 2.5 MG/ML IV SOLN
INTRAVENOUS | Status: AC
  Filled 2021-07-04: qty 2

## 2021-07-04 MED ORDER — SODIUM CHLORIDE 0.9% FLUSH
3.0000 mL | Freq: Two times a day (BID) | INTRAVENOUS | Status: DC
  Administered 2021-07-04 – 2021-07-05 (×3): 3 mL via INTRAVENOUS

## 2021-07-04 MED ORDER — SODIUM CHLORIDE 0.9 % WEIGHT BASED INFUSION
3.0000 mL/kg/h | INTRAVENOUS | Status: DC
  Administered 2021-07-04: 3 mL/kg/h via INTRAVENOUS

## 2021-07-04 MED ORDER — HEPARIN (PORCINE) IN NACL 1000-0.9 UT/500ML-% IV SOLN
INTRAVENOUS | Status: AC
  Filled 2021-07-04: qty 1000

## 2021-07-04 MED ORDER — NITROGLYCERIN IN D5W 200-5 MCG/ML-% IV SOLN
5.0000 ug/min | INTRAVENOUS | Status: DC
Start: 2021-07-04 — End: 2021-07-04
  Administered 2021-07-04: 20 ug/min via INTRAVENOUS
  Administered 2021-07-04: 10 ug/min via INTRAVENOUS
  Administered 2021-07-04: 15 ug/min via INTRAVENOUS
  Filled 2021-07-04: qty 250

## 2021-07-04 MED ORDER — TICAGRELOR 90 MG PO TABS
90.0000 mg | ORAL_TABLET | Freq: Two times a day (BID) | ORAL | Status: DC
  Administered 2021-07-05 – 2021-07-06 (×3): 90 mg via ORAL
  Filled 2021-07-04 (×3): qty 1

## 2021-07-04 MED ORDER — SODIUM CHLORIDE 0.9 % IV SOLN
4.0000 ug/kg/min | INTRAVENOUS | Status: AC
  Filled 2021-07-04: qty 50

## 2021-07-04 MED ORDER — HYDROMORPHONE HCL 1 MG/ML IJ SOLN
1.0000 mg | Freq: Once | INTRAMUSCULAR | Status: AC
Start: 2021-07-04 — End: 2021-07-04
  Administered 2021-07-04: 1 mg via INTRAVENOUS
  Filled 2021-07-04: qty 1

## 2021-07-04 MED ORDER — ASPIRIN 81 MG PO CHEW
324.0000 mg | CHEWABLE_TABLET | ORAL | Status: AC
  Administered 2021-07-04: 324 mg via ORAL

## 2021-07-04 MED ORDER — SODIUM CHLORIDE 0.9 % IV SOLN: 250.0000 mL | INTRAVENOUS | Status: DC | PRN

## 2021-07-04 MED ORDER — MORPHINE SULFATE (PF) 4 MG/ML IV SOLN
4.0000 mg | Freq: Once | INTRAVENOUS | Status: AC
  Administered 2021-07-04: 4 mg via INTRAVENOUS
  Filled 2021-07-04: qty 1

## 2021-07-04 MED ORDER — IOHEXOL 350 MG/ML SOLN
INTRAVENOUS | Status: DC | PRN
  Administered 2021-07-04: 150 mL

## 2021-07-04 MED ORDER — LABETALOL HCL 5 MG/ML IV SOLN: 10.0000 mg | INTRAVENOUS | Status: AC | PRN

## 2021-07-04 MED ORDER — ENOXAPARIN SODIUM 40 MG/0.4ML IJ SOSY
40.0000 mg | PREFILLED_SYRINGE | INTRAMUSCULAR | Status: DC
  Filled 2021-07-04: qty 0.4

## 2021-07-04 MED ORDER — ZOLPIDEM TARTRATE 5 MG PO TABS
5.0000 mg | ORAL_TABLET | Freq: Every evening | ORAL | Status: DC | PRN
  Administered 2021-07-05: 5 mg via ORAL
  Filled 2021-07-04: qty 1

## 2021-07-04 MED ORDER — SODIUM CHLORIDE 0.9% FLUSH: 3.0000 mL | INTRAVENOUS | Status: DC | PRN

## 2021-07-04 MED ORDER — NITROGLYCERIN 0.4 MG SL SUBL: 0.4000 mg | SUBLINGUAL_TABLET | SUBLINGUAL | Status: DC | PRN

## 2021-07-04 MED ORDER — HEPARIN SODIUM (PORCINE) 1000 UNIT/ML IJ SOLN
INTRAMUSCULAR | Status: AC
  Filled 2021-07-04: qty 1

## 2021-07-04 MED ORDER — SODIUM CHLORIDE 0.9 % IV SOLN
12.5000 mg | Freq: Four times a day (QID) | INTRAVENOUS | Status: DC | PRN
  Administered 2021-07-04: 12.5 mg via INTRAVENOUS
  Filled 2021-07-04: qty 12.5
  Filled 2021-07-04: qty 0.5

## 2021-07-04 MED ORDER — HEPARIN (PORCINE) 25000 UT/250ML-% IV SOLN
1000.0000 [IU]/h | INTRAVENOUS | Status: DC
  Administered 2021-07-04: 1000 [IU]/h via INTRAVENOUS
  Filled 2021-07-04: qty 250

## 2021-07-04 MED ORDER — VENLAFAXINE HCL ER 150 MG PO CP24
150.0000 mg | ORAL_CAPSULE | Freq: Every day | ORAL | Status: DC
  Administered 2021-07-04 – 2021-07-06 (×3): 150 mg via ORAL
  Filled 2021-07-04 (×4): qty 1

## 2021-07-04 MED ORDER — HEPARIN (PORCINE) IN NACL 1000-0.9 UT/500ML-% IV SOLN
INTRAVENOUS | Status: DC | PRN
  Administered 2021-07-04 (×2): 500 mL

## 2021-07-04 MED ORDER — CANGRELOR BOLUS VIA INFUSION
INTRAVENOUS | Status: DC | PRN
  Administered 2021-07-04: 2382 ug via INTRAVENOUS

## 2021-07-04 MED ORDER — NITROGLYCERIN IN D5W 200-5 MCG/ML-% IV SOLN
5.0000 ug/min | INTRAVENOUS | Status: DC
  Administered 2021-07-05: 60 ug/min via INTRAVENOUS
  Filled 2021-07-04: qty 250

## 2021-07-04 MED ORDER — TICAGRELOR 90 MG PO TABS
ORAL_TABLET | ORAL | Status: DC | PRN
  Administered 2021-07-04: 180 mg via ORAL

## 2021-07-04 MED ORDER — SODIUM CHLORIDE 0.9 % WEIGHT BASED INFUSION: 3.0000 mL/kg/h | INTRAVENOUS | Status: DC

## 2021-07-04 MED ORDER — LACTATED RINGERS IV BOLUS
1000.0000 mL | Freq: Once | INTRAVENOUS | Status: AC
  Administered 2021-07-04: 1000 mL via INTRAVENOUS

## 2021-07-04 MED ORDER — TICAGRELOR 90 MG PO TABS
ORAL_TABLET | ORAL | Status: AC
  Filled 2021-07-04: qty 2

## 2021-07-04 MED ORDER — ASPIRIN 81 MG PO CHEW
324.0000 mg | CHEWABLE_TABLET | Freq: Once | ORAL | Status: AC
  Administered 2021-07-04: 324 mg via ORAL
  Filled 2021-07-04: qty 4

## 2021-07-04 MED ORDER — FENTANYL CITRATE (PF) 100 MCG/2ML IJ SOLN
INTRAMUSCULAR | Status: DC | PRN
  Administered 2021-07-04 (×2): 25 ug via INTRAVENOUS

## 2021-07-04 MED ORDER — FENTANYL CITRATE (PF) 100 MCG/2ML IJ SOLN
INTRAMUSCULAR | Status: AC
  Filled 2021-07-04: qty 2

## 2021-07-04 MED ORDER — LIDOCAINE HCL (PF) 1 % IJ SOLN
INTRAMUSCULAR | Status: AC
  Filled 2021-07-04: qty 30

## 2021-07-04 SURGICAL SUPPLY — 17 items
BALLN SAPPHIRE 2.0X12 (BALLOONS) ×2
BALLN SAPPHIRE ~~LOC~~ 2.75X15 (BALLOONS) ×2 IMPLANT
BALLOON SAPPHIRE 2.0X12 (BALLOONS) ×1 IMPLANT
CATH LAUNCHER 6FR EBU3.5 (CATHETERS) ×2 IMPLANT
CATH OPTITORQUE TIG 4.0 5F (CATHETERS) ×2 IMPLANT
DEVICE RAD COMP TR BAND LRG (VASCULAR PRODUCTS) ×2 IMPLANT
GLIDESHEATH SLEND SS 6F .021 (SHEATH) ×2 IMPLANT
GUIDEWIRE INQWIRE 1.5J.035X260 (WIRE) ×1 IMPLANT
INQWIRE 1.5J .035X260CM (WIRE) ×2
KIT ENCORE 26 ADVANTAGE (KITS) ×2 IMPLANT
KIT HEART LEFT (KITS) ×2 IMPLANT
PACK CARDIAC CATHETERIZATION (CUSTOM PROCEDURE TRAY) ×2 IMPLANT
STENT ONYX FRONTIER 2.5X26 (Permanent Stent) ×2 IMPLANT
TRANSDUCER W/STOPCOCK (MISCELLANEOUS) ×2 IMPLANT
TUBING CIL FLEX 10 FLL-RA (TUBING) ×2 IMPLANT
WIRE HI TORQ BMW 190CM (WIRE) ×2 IMPLANT
WIRE RUNTHROUGH .014X180CM (WIRE) ×2 IMPLANT

## 2021-07-04 NOTE — ED Provider Notes (Signed)
Nitroglycerin drip added due to significant hypertension.  He also has chest pain when he has awoken.  Cardiology has seen and contacted care like to take him urgently to the Cath Lab.   Pricilla Loveless, MD 07/04/21 [phone]

## 2021-07-04 NOTE — Progress Notes (Signed)
ANTICOAGULATION CONSULT NOTE -   Pharmacy Consult for Heparin  Indication: chest pain/ACS  Allergies  Allergen Reactions   Sulfa Antibiotics Hives   Sulfamethoxazole Rash    Patient Measurements: Height: 5\' 9"  (175.3 cm) Weight: 79.4 kg (175 lb) IBW/kg (Calculated) : 70.7  Vital Signs: Temp: 98.6 F (37 C) (10/24 0032) Temp Source: Oral (10/24 0032) BP: 178/88 (10/24 0945) Pulse Rate: 61 (10/24 0945)  Labs: Recent Labs    07/04/21 0034 07/04/21 0231 07/04/21 0937  HGB 17.2*  --   --   HCT 51.3  --   --   PLT 235  --   --   HEPARINUNFRC  --   --  0.38  CREATININE 0.91  --   --   TROPONINIHS 429* 386*  --      Estimated Creatinine Clearance: 72.3 mL/min (by C-G formula based on SCr of 0.91 mg/dL).   Medical History: Past Medical History:  Diagnosis Date   Anxiety    BPH (benign prostatic hyperplasia)    Gout    Hypercholesterolemia    Hyperparathyroidism (HCC)    Hypertension    Pulmonary emboli (H[facility])    Vitamin D deficiency    Vitamin D deficiency disease 10/07/2019    Assessment: 75 y/o M with a burning in the chest, found to have elevated troponin, starting heparin.  Trop 429 > 386 HL 0.38- therapeutic  Goal of Therapy:  Heparin level 0.3-0.7 units/ml Monitor platelets by anticoagulation protocol: Yes   Plan:  Continue heparin infusion at 1000 units/hr Heparin in level in 6-8 hours and daily. Monitor H&H and s/s of bleeding.  67, PharmD Clinical Pharmacist 07/04/2021 10:15 AM

## 2021-07-04 NOTE — ED Provider Notes (Signed)
[facility] EMERGENCY DEPARTMENT Provider Note   CSN: [id number] Arrival date & time: 07/04/21  0024     History Chief Complaint  Patient presents with   Chest Pain    Billy Hunt is a 75 y.o. male.  The history is provided by the patient.  Chest Pain He has history of peripheral neuropathy, hyperparathyroidism and comes in because of burning in the's chest which started this morning and has been present all day.  Burning has waxed and waned but he has not noticed anything that seems to make it better or worse.  He has tried taking over-the-counter antacids which have not given him any relief.  At its worst, the burning sensation is rated at 8/10.  There has been associated nausea and vomiting as well as some mild diaphoresis but no dyspnea.  There is no radiation of the discomfort.  He is never had discomfort like this before.  He finally chose to come in because he was too uncomfortable to sleep.  He is a non-smoker and denies history of hypertension, diabetes, hyperlipidemia and denies family history of premature coronary atherosclerosis.   Past Medical History:  Diagnosis Date   Anxiety    BPH (benign prostatic hyperplasia)    Gout    Hypercholesterolemia    Hyperparathyroidism ([place])    Hypertension    Pulmonary emboli (HCC)    Vitamin D deficiency    Vitamin D deficiency disease 10/07/2019    Patient Active Problem List   Diagnosis Date Noted   Encounter to establish care 06/01/2021   GAD (generalized anxiety disorder) 06/01/2021   Idiopathic peripheral neuropathy 06/01/2021   BPH (benign prostatic hyperplasia) 10/07/2019   Hyperparathyroidism, primary ([place]) 11/20/2018   Osteopenia 11/20/2018   History of pulmonary embolus (PE) 04/21/2015   HTN (hypertension) 04/21/2015   Gout 04/21/2015   Hyperlipidemia 04/21/2015    Past Surgical History:  Procedure Laterality Date   PERIPHERAL VASCULAR THROMBECTOMY     TRANSURETHRAL RESECTION OF PROSTATE     VASECTOMY          Family History  Problem Relation Age of Onset   Alzheimer's disease Mother        Vascular Dementia   Stroke Mother    Breast cancer Mother    Cancer Father        Multiple Myeloma   Hypertension Father    Deep vein thrombosis Brother    Heart disease Brother    Cancer Paternal Grandmother    Alcohol abuse Paternal Grandfather     Social History   Tobacco Use   Smoking status: Former    Packs/day: 1.00    Years: 10.00    Pack years: 10.00    Types: Cigarettes    Quit date: 04/21/1971    Years since quitting: 50.2   Smokeless tobacco: Never  Vaping Use   Vaping Use: Never used  Substance Use Topics   Alcohol use: No    Comment: former   Drug use: Yes    Types: Marijuana    Comment: last use: 9/30    Home Medications Prior to Admission medications   Medication Sig Start Date End Date Taking? Authorizing Provider  cholecalciferol (VITAMIN D3) 25 MCG (1000 UT) tablet Take 2,000 Units by mouth daily.     [provider]  cinacalcet (SENSIPAR) 30 MG tablet Take 1 tablet (30 mg total) by mouth daily with breakfast. 06/09/21   Nida, Marella Chimes, MD  gabapentin (NEURONTIN) 300 MG capsule Take 300 mg by  mouth 3 (three) times daily. 08/13/20   [provider]  tamsulosin (FLOMAX) 0.4 MG CAPS capsule Take 1 capsule (0.4 mg total) by mouth daily. 06/01/21   Lindell Spar, MD  venlafaxine XR (EFFEXOR-XR) 150 MG 24 hr capsule Take 1 capsule (150 mg total) by mouth daily. 06/01/21   Lindell Spar, MD    Allergies    Sulfa antibiotics and Sulfamethoxazole  Review of Systems   Review of Systems  Cardiovascular:  Positive for chest pain.  All other systems reviewed and are negative.  Physical Exam Updated Vital Signs BP (!) 208/87   Pulse (!) 58   Temp 98.6 F (37 C) (Oral)   Resp 15   Ht 5' 9"  (1.753 m)   Wt 79.4 kg   SpO2 97%   BMI 25.84 kg/m   Physical Exam Vitals and nursing note reviewed.  75 year old male, resting comfortably and  in no acute distress. Vital signs are significant for elevated blood pressure. Oxygen saturation is 97%, which is normal. Head is normocephalic and atraumatic. PERRLA, EOMI. Oropharynx is clear. Neck is nontender and supple without adenopathy or JVD. Back is nontender and there is no CVA tenderness. Lungs are clear without rales, wheezes, or rhonchi. Chest is nontender. Heart has regular rate and rhythm without murmur. Abdomen is soft, flat, nontender without masses or hepatosplenomegaly and peristalsis is normoactive. Extremities have no cyanosis or edema, full range of motion is present. Skin is warm and dry without rash. Neurologic: Mental status is normal, cranial nerves are intact, there are no motor or sensory deficits.  ED Results / Procedures / Treatments   Labs (all labs ordered are listed, but only abnormal results are displayed) Labs Reviewed  BASIC METABOLIC PANEL - Abnormal; Notable for the following components:      Result Value   Glucose, Bld 142 (*)    Calcium 10.8 (*)    All other components within normal limits  CBC WITH DIFFERENTIAL/PLATELET - Abnormal; Notable for the following components:   WBC 11.1 (*)    Hemoglobin 17.2 (*)    All other components within normal limits  TROPONIN I (HIGH SENSITIVITY) - Abnormal; Notable for the following components:   Troponin I (High Sensitivity) 429 (*)    All other components within normal limits  TROPONIN I (HIGH SENSITIVITY) - Abnormal; Notable for the following components:   Troponin I (High Sensitivity) 386 (*)    All other components within normal limits  RESP PANEL BY RT-PCR (FLU A&B, COVID) [id number]  HEPARIN LEVEL (UNFRACTIONATED)    EKG EKG Interpretation  Date/Time:  Monday July 04 2021 00:31:29 EDT Ventricular Rate:  51 PR Interval:  136 QRS Duration: 133 QT Interval:  470 QTC Calculation: 433 R Axis:   68 Text Interpretation: Sinus rhythm Right bundle branch block Nonspecific ST abnormality When  compared with ECG of 06/21/2015, Nonspecific ST abnormality No significant change was found present Confirmed by Delora Fuel ([id number]) on 07/04/2021 12:34:18 AM  Radiology DG Chest Port 1 View  Result Date: 07/04/2021 CLINICAL DATA:  Chest pain. EXAM: PORTABLE CHEST 1 VIEW COMPARISON:  Chest radiograph dated 04/20/2015 and CT dated 07/27/2017. FINDINGS: There is mild eventration of the right hemidiaphragm. No focal consolidation, pleural effusion or pneumothorax. The cardiac silhouette is within limits. No acute osseous pathology. IMPRESSION: No active disease. Electronically Signed   By: Anner Crete M.D.   On: 07/04/2021 01:03    Procedures Procedures  CRITICAL CARE Performed by: Delora Fuel Total critical  care time: 145 minutes Critical care time was exclusive of separately billable procedures and treating other patients. Critical care was necessary to treat or prevent imminent or life-threatening deterioration. Critical care was time spent personally by me on the following activities: development of treatment plan with patient and/or surrogate as well as nursing, discussions with consultants, evaluation of patient's response to treatment, examination of patient, obtaining history from patient or surrogate, ordering and performing treatments and interventions, ordering and review of laboratory studies, ordering and review of radiographic studies, pulse oximetry and re-evaluation of patient's condition.  Medications Ordered in ED Medications  heparin ADULT infusion 100 units/mL (25000 units/252m) (1,000 Units/hr Intravenous New Bag/Given 07/04/21 0223)  aspirin chewable tablet 324 mg (324 mg Oral Given 07/04/21 0051)  ondansetron (ZOFRAN) injection 4 mg (4 mg Intravenous Given 07/04/21 0051)  nitroGLYCERIN (NITROSTAT) SL tablet 0.4 mg (0.4 mg Sublingual Given 07/04/21 0102)  morphine 4 MG/ML injection 4 mg (4 mg Intravenous Given 07/04/21 0110)  lactated ringers bolus 1,000 mL (0 mLs  Intravenous Stopped 07/04/21 0156)  morphine 4 MG/ML injection 4 mg (4 mg Intravenous Given 07/04/21 0217)  heparin bolus via infusion 4,000 Units (4,000 Units Intravenous Bolus from Bag 07/04/21 0222)  alum & mag hydroxide-simeth (MAALOX/MYLANTA) 200-200-20 MG/5ML suspension 30 mL (30 mLs Oral Given 07/04/21 0337)  morphine 4 MG/ML injection 4 mg (4 mg Intravenous Given 07/04/21 0338)  HYDROmorphone (DILAUDID) injection 1 mg (1 mg Intravenous Given 07/04/21 0603)    ED Course  I have reviewed the triage vital signs and the nursing notes.  Pertinent labs & imaging results that were available during my care of the patient were reviewed by me and considered in my medical decision making (see chart for details).   MDM Rules/Calculators/A&P                         Chest discomfort concerning for possible ACS.  Markedly elevated blood pressure and patient without a history of hypertension.  Please note that patient's problem list states hypertension and hyperlipidemia, but he is not on any medications for either.  ECG shows right bundle branch block and significant ST depression in V2 and borderline ST elevation in V1.  ECG is not diagnostic but certainly concerning for cardiac ischemia.  He is given aspirin and will be given a therapeutic trial of nitroglycerin.  Old records are reviewed, and last 2 clinic visits have had mildly elevated blood pressure but no blood pressure elevations comparable to what he is having today.  He did have a prior ED visit for chest pain in 2018, but that was felt to be secondary to a respiratory infection.  1:07 AM He has not noticed any change in the burning sensation with nitroglycerin, but blood pressure has come down almost to normal.  He will be given morphine for pain.  Chest x-ray shows no acute process.  2:12 AM He is still having pain.  Blood pressure dropped briefly into the 90s and he was given IV fluids and pressure has come back up.  He is given additional  morphine.  Troponin has come back elevated consistent with ACS.  He is started on heparin.  ECG is repeated and continues to show ST depression in V2 without ST segment changes in other leads.  Computer reading of both ECGs stated acute MI.  I have discussed the case with Dr. KHumphrey Rollsof cardiology service who has reviewed the ECGs and agrees that they do not meet  criteria for CODE STEMI.  We will try to arrange transfer to [facility] because of ongoing chest pain and probable need for urgent, but not emergent, coronary intervention.  Dr. Humphrey Rolls has excepted the patient on the cardiology service.  Patient still was not getting pain relief with morphine and was given hydromorphone which has given him better pain relief.  Repeat troponin has actually decreased slightly.  Blood pressure remains elevated.  6:52 AM Still waiting for bed assignment at [facility].  Final Clinical Impression(s) / ED Diagnoses Final diagnoses:  Acute coronary syndrome ([place])  Elevated blood pressure reading without diagnosis of hypertension  Elevated random blood glucose level    Rx / DC Orders ED Discharge Orders     None        Delora Fuel, MD 40/98/28 [phone]

## 2021-07-04 NOTE — Progress Notes (Signed)
*  PRELIMINARY RESULTS* Echocardiogram 2D Echocardiogram has been performed.  Billy Hunt 07/04/2021, 10:20 AM

## 2021-07-04 NOTE — ED Notes (Signed)
Daughter has pts personal belongings.

## 2021-07-04 NOTE — Interval H&P Note (Signed)
History and Physical Interval Note:  07/04/2021 12:11 PM  Billy Hunt  has presented today for surgery, with the diagnosis of NSTEMI.  The various methods of treatment have been discussed with the patient and family. After consideration of risks, benefits and other options for treatment, the patient has consented to  Procedure(s): LEFT HEART CATH AND CORONARY ANGIOGRAPHY (N/A) as a surgical intervention.  The patient's history has been reviewed, patient examined, no change in status, stable for surgery.  I have reviewed the patient's chart and labs.  Questions were answered to the patient's satisfaction.    Cath Lab Visit (complete for each Cath Lab visit)  Clinical Evaluation Leading to the Procedure:   ACS: Yes.    Non-ACS:  N/A  Aleeyah Bensen

## 2021-07-04 NOTE — ED Notes (Signed)
Family at beside.  ECHO in progress.

## 2021-07-04 NOTE — H&P (Addendum)
Cardiology Admission History and Physical:   Patient ID: Billy Hunt MRN: [id number]; DOB: 06-Aug-1946   Admission date: 07/04/2021  PCP:  Anabel Halon, MD   [facility] Providers Cardiologist:  None        Chief Complaint:  chest pain  Patient Profile:   Billy Hunt is a 75 y.o. male with no prior cardiac hx who is being seen 07/04/2021 for the evaluation of NSTEMI.  History of Present Illness:   [name] is a 75 yo male patient with history of HLD-previously on statin and not sure why it was stopped, DVT and pulmonary embolus 6 yrs ago ? Secondary to testosterone. Smoked age [age] and was told he had mild COPD. Is active in his yard building rock gardens without symptoms until yest. No exertional symptoms in past.No HTN, DM or family history.   Yesterday he had burning chest pain, nausea and vomiting and felt bad all day. Pain would come and go unrelieved with antacids. 10 pm when he went to be it became worse and came to ED. His BP was high initally treated with IV NTG but dropped and given IV fluids. BP back up this am and having chest pain-EKG with RBBB and ST depression V2. Troponins 429, 386.   Past Medical History:  Diagnosis Date   Anxiety    BPH (benign prostatic hyperplasia)    Gout    Hypercholesterolemia    Hyperparathyroidism (HCC)    Hypertension    Pulmonary emboli (HCC)    Vitamin D deficiency    Vitamin D deficiency disease 10/07/2019    Past Surgical History:  Procedure Laterality Date   PERIPHERAL VASCULAR THROMBECTOMY     TRANSURETHRAL RESECTION OF PROSTATE     VASECTOMY       Medications Prior to Admission: Prior to Admission medications   Medication Sig Start Date End Date Taking? Authorizing Provider  cholecalciferol (VITAMIN D3) 25 MCG (1000 UT) tablet Take 2,000 Units by mouth daily.     [provider]  cinacalcet (SENSIPAR) 30 MG tablet Take 1 tablet (30 mg total) by mouth daily with breakfast. 06/09/21   Nida,  Denman George, MD  gabapentin (NEURONTIN) 300 MG capsule Take 300 mg by mouth 3 (three) times daily. 08/13/20   [provider]  tamsulosin (FLOMAX) 0.4 MG CAPS capsule Take 1 capsule (0.4 mg total) by mouth daily. 06/01/21   Anabel Halon, MD  venlafaxine XR (EFFEXOR-XR) 150 MG 24 hr capsule Take 1 capsule (150 mg total) by mouth daily. 06/01/21   Anabel Halon, MD     Allergies:    Allergies  Allergen Reactions   Sulfa Antibiotics Hives   Sulfamethoxazole Rash    Social History:   Social History   Socioeconomic History   Marital status: Single    Spouse name: Not on file   Number of children: Not on file   Years of education: Not on file   Highest education level: Not on file  Occupational History   Not on file  Tobacco Use   Smoking status: Former    Packs/day: 1.00    Years: 10.00    Pack years: 10.00    Types: Cigarettes    Quit date: 04/21/1971    Years since quitting: 50.2   Smokeless tobacco: Never  Vaping Use   Vaping Use: Never used  Substance and Sexual Activity   Alcohol use: No    Comment: former   Drug use: Yes    Types: Marijuana  Comment: last use: 9/30   Sexual activity: Yes    Birth control/protection: Other-see comments  Other Topics Concern   Not on file  Social History Narrative   Not on file   Social Determinants of Health   Financial Resource Strain: Not on file  Food Insecurity: Not on file  Transportation Needs: Not on file  Physical Activity: Not on file  Stress: Not on file  Social Connections: Not on file  Intimate Partner Violence: Not on file    Family History:    The patient's family history includes Alcohol abuse in his paternal grandfather; Alzheimer's disease in his mother; Breast cancer in his mother; Cancer in his father and paternal grandmother; Deep vein thrombosis in his brother; Heart disease in his brother; Hypertension in his father; Stroke in his mother.    ROS:  Please see the history of present  illness.  Review of Systems  Constitutional: Negative.  HENT: Negative.    Cardiovascular:  Positive for chest pain.  Respiratory: Negative.    Endocrine: Negative.   Hematologic/Lymphatic: Negative.   Musculoskeletal: Negative.   Gastrointestinal:  Positive for nausea and vomiting.  Genitourinary: Negative.   Neurological: Negative.   All other ROS reviewed and negative.     Physical Exam/Data:   Vitals:   07/04/21 0330 07/04/21 0400 07/04/21 0500 07/04/21 0600  BP: (!) 173/90 (!) 184/82 (!) 188/89 (!) 191/91  Pulse: (!) 51 62 62 (!) 54  Resp: 17 20 (!) 21 17  Temp:      TempSrc:      SpO2: 99% 99% 99% 98%  Weight:      Height:       No intake or output data in the 24 hours ending 07/04/21 0819 Last 3 Weights 07/04/2021 06/08/2021 06/01/2021  Weight (lbs) 175 lb 177 lb 177 lb 0.6 oz  Weight (kg) 79.379 kg 80.287 kg 80.305 kg     Body mass index is 25.84 kg/m.  General:  Well nourished, well developed, in no acute distress  HEENT: normal Neck: no  JVD Vascular: No carotid bruits; Distal pulses 2+ bilaterally   Cardiac:  normal S1, S2; RRR; no murmur   Lungs:  clear to auscultation bilaterally, no wheezing, rhonchi or rales  Abd: soft, nontender, no hepatomegaly  Ext: no  edema Musculoskeletal:  No deformities, BUE and BLE strength normal and equal Skin: warm and dry  Neuro:  CNs 2-12 intact, no focal abnormalities noted Psych:  Normal affect    EKG:  The ECG that was done   was personally reviewed and demonstrates NSR with RBBB, ST depression V2, bigeminy  Relevant CV Studies:    Laboratory Data:  High Sensitivity Troponin:   Recent Labs  Lab 07/04/21 0034 07/04/21 0231  TROPONINIHS 429* 386*      Chemistry Recent Labs  Lab 07/04/21 0034  NA 138  K 3.7  CL 102  CO2 24  GLUCOSE 142*  BUN 18  CREATININE 0.91  CALCIUM 10.8*  GFRNONAA >60  ANIONGAP 12    No results for input(s): PROT, ALBUMIN, AST, ALT, ALKPHOS, BILITOT in the last 168  hours. Lipids No results for input(s): CHOL, TRIG, HDL, LABVLDL, LDLCALC, CHOLHDL in the last 168 hours. Hematology Recent Labs  Lab 07/04/21 0034  WBC 11.1*  RBC 5.47  HGB 17.2*  HCT 51.3  MCV 93.8  MCH 31.4  MCHC 33.5  RDW 12.7  PLT 235   Thyroid No results for input(s): TSH, FREET4 in the last 168 hours.  BNPNo results for input(s): BNP, PROBNP in the last 168 hours.  DDimer No results for input(s): DDIMER in the last 168 hours.   Radiology/Studies:  DG Chest Port 1 View  Result Date: 07/04/2021 CLINICAL DATA:  Chest pain. EXAM: PORTABLE CHEST 1 VIEW COMPARISON:  Chest radiograph dated 04/20/2015 and CT dated 07/27/2017. FINDINGS: There is mild eventration of the right hemidiaphragm. No focal consolidation, pleural effusion or pneumothorax. The cardiac silhouette is within limits. No acute osseous pathology. IMPRESSION: No active disease. Electronically Signed   By: Elgie Collard M.D.   On: 07/04/2021 01:03     Assessment and Plan:   NSTEMI with troponins 429, 386, ST depression V2, RBBB-continues to have chest pain on IV heparin and NTG.transfer to [facility] for cath today for urgent cath. I have reviewed the risks, indications, and alternatives to angioplasty and stenting with the patient. Risks include but are not limited to bleeding, infection, vascular injury, stroke, myocardial infection, arrhythmia, kidney injury, radiation-related injury in the case of prolonged fluoroscopy use, emergency cardiac surgery, and death. The patient understands the risks of serious complication is low (<1%) and patient agrees to proceed.    HTN on admission with drop of BP treated with IV fluids, now back up and on IV NTG  HLD-previously on simvastatin but stopped by PCP-not sure why  Former remote smoker   Risk Assessment/Risk Scores:    TIMI Risk Score for Unstable Angina or Non-ST Elevation MI:   The patient's TIMI risk score is 4, which indicates a 20% risk of all cause mortality,  new or recurrent myocardial infarction or need for urgent revascularization in the next 14 days.       Severity of Illness: The appropriate patient status for this patient is OBSERVATION. Observation status is judged to be reasonable and necessary in order to provide the required intensity of service to ensure the patient's safety. The patient's presenting symptoms, physical exam findings, and initial radiographic and laboratory data in the context of their medical condition is felt to place them at decreased risk for further clinical deterioration. Furthermore, it is anticipated that the patient will be medically stable for discharge from the hospital within 2 midnights of admission.    For questions or updates, please contact [facility] Please consult [email or web address] for contact info under     Signed, Jacolyn Reedy, PA-C  07/04/2021 8:19 AM    Patient seen and examined  I agree with findings as noted by Leda Gauze above  Pt with no prior cardiac Hx    Feeling bad since yesteray   N/, chest pressure    [facility]    Here Trop elevated.  COntinued discomfort   EKG with R bundle and ST depresssion in anterior precordium  On exam  Neck:  JVP is normal Lungs   Moving air well  No wheezes Cardiac RRR   S1, S2  No S3 or murmurs Abd is supple Chest nontender Ext are without edema   I owuld recomm continuing heparin and NTG   Titrate as bp tolerates Transfer for L heart cath  Risks and benefits described   Patient understands and agrees to proceed  Will tx via [facility] Ada MD

## 2021-07-04 NOTE — Progress Notes (Signed)
ANTICOAGULATION CONSULT NOTE - Initial Consult  Pharmacy Consult for Heparin  Indication: chest pain/ACS  Allergies  Allergen Reactions   Sulfa Antibiotics Hives   Sulfamethoxazole Rash    Patient Measurements: Height: 5\' 9"  (175.3 cm) Weight: 79.4 kg (175 lb) IBW/kg (Calculated) : 70.7  Vital Signs: Temp: 98.6 F (37 C) (10/24 0032) Temp Source: Oral (10/24 0032) BP: 192/80 (10/24 0320) Pulse Rate: 55 (10/24 0320)  Labs: Recent Labs    07/04/21 0034 07/04/21 0231  HGB 17.2*  --   HCT 51.3  --   PLT 235  --   CREATININE 0.91  --   TROPONINIHS 429* 386*    Estimated Creatinine Clearance: 72.3 mL/min (by C-G formula based on SCr of 0.91 mg/dL).   Medical History: Past Medical History:  Diagnosis Date   Anxiety    BPH (benign prostatic hyperplasia)    Gout    Hypercholesterolemia    Hyperparathyroidism (HCC)    Hypertension    Pulmonary emboli (HCC)    Vitamin D deficiency    Vitamin D deficiency disease 10/07/2019    Assessment: 75 y/o M with a burning in the chest, found to have elevated troponin, starting heparin, CBC/renal function good, PTA meds reviewed.   Goal of Therapy:  Heparin level 0.3-0.7 units/ml Monitor platelets by anticoagulation protocol: Yes   Plan:  Heparin 4000 units BOLUS Start heparin drip at 1000 units/hr 1000 Heparin level Daily CBC/Heparin level Monitor for bleeding  67, PharmD, BCPS Clinical Pharmacist Phone: [phone]

## 2021-07-04 NOTE — ED Notes (Signed)
Called cath lab to verify if we need to hold pt for ECHO, Vernona Rieger said yes if ordered.  Informed that [facility] will be here in about 15 minutes and ECHO takes about 20-30 minutes.

## 2021-07-04 NOTE — ED Triage Notes (Addendum)
Pt states he has had what has felt like heartburn all day and took some Maalox for indigestion this evening but "nothing has touched it". Pt also states he has vomited several times today.

## 2021-07-04 NOTE — Progress Notes (Signed)
Received patient from [facility] via [facility], resp even and unlabored, with complaint of CP pain  7 out of 10  and feeling nauseated.  Pt on monitor , Dr Excell Seltzer in to see patient.   IVF infusing with NTG at and Hep IV at 10cc/hr.  Pt waiting for cath procedure.

## 2021-07-04 NOTE — ED Notes (Signed)
Called [facility] at 8:50 AM.  They have this patient's information and as soon as cath lab notifies them of a time, they will call us when enroute.

## 2021-07-04 NOTE — ED Notes (Signed)
Transferred to [facility].  

## 2021-07-05 ENCOUNTER — Other Ambulatory Visit (HOSPITAL_COMMUNITY): Payer: Self-pay

## 2021-07-05 ENCOUNTER — Encounter (HOSPITAL_COMMUNITY): Payer: Self-pay | Admitting: Internal Medicine

## 2021-07-05 DIAGNOSIS — D72829 Elevated white blood cell count, unspecified: Secondary | ICD-10-CM | POA: Diagnosis not present

## 2021-07-05 DIAGNOSIS — Z86711 Personal history of pulmonary embolism: Secondary | ICD-10-CM | POA: Diagnosis not present

## 2021-07-05 DIAGNOSIS — Z79899 Other long term (current) drug therapy: Secondary | ICD-10-CM | POA: Diagnosis not present

## 2021-07-05 DIAGNOSIS — Z882 Allergy status to sulfonamides status: Secondary | ICD-10-CM | POA: Diagnosis not present

## 2021-07-05 DIAGNOSIS — M109 Gout, unspecified: Secondary | ICD-10-CM | POA: Diagnosis present

## 2021-07-05 DIAGNOSIS — G629 Polyneuropathy, unspecified: Secondary | ICD-10-CM | POA: Diagnosis present

## 2021-07-05 DIAGNOSIS — I25110 Atherosclerotic heart disease of native coronary artery with unstable angina pectoris: Secondary | ICD-10-CM | POA: Diagnosis present

## 2021-07-05 DIAGNOSIS — I25118 Atherosclerotic heart disease of native coronary artery with other forms of angina pectoris: Secondary | ICD-10-CM | POA: Diagnosis not present

## 2021-07-05 DIAGNOSIS — N400 Enlarged prostate without lower urinary tract symptoms: Secondary | ICD-10-CM | POA: Diagnosis present

## 2021-07-05 DIAGNOSIS — J449 Chronic obstructive pulmonary disease, unspecified: Secondary | ICD-10-CM | POA: Diagnosis present

## 2021-07-05 DIAGNOSIS — I4510 Unspecified right bundle-branch block: Secondary | ICD-10-CM | POA: Diagnosis present

## 2021-07-05 DIAGNOSIS — I214 Non-ST elevation (NSTEMI) myocardial infarction: Secondary | ICD-10-CM | POA: Diagnosis not present

## 2021-07-05 DIAGNOSIS — I10 Essential (primary) hypertension: Secondary | ICD-10-CM | POA: Diagnosis present

## 2021-07-05 DIAGNOSIS — R030 Elevated blood-pressure reading, without diagnosis of hypertension: Secondary | ICD-10-CM | POA: Diagnosis not present

## 2021-07-05 DIAGNOSIS — E213 Hyperparathyroidism, unspecified: Secondary | ICD-10-CM | POA: Diagnosis present

## 2021-07-05 DIAGNOSIS — Z20822 Contact with and (suspected) exposure to covid-19: Secondary | ICD-10-CM | POA: Diagnosis present

## 2021-07-05 DIAGNOSIS — E782 Mixed hyperlipidemia: Secondary | ICD-10-CM | POA: Diagnosis not present

## 2021-07-05 DIAGNOSIS — E210 Primary hyperparathyroidism: Secondary | ICD-10-CM | POA: Diagnosis present

## 2021-07-05 DIAGNOSIS — I160 Hypertensive urgency: Secondary | ICD-10-CM | POA: Diagnosis not present

## 2021-07-05 DIAGNOSIS — R509 Fever, unspecified: Secondary | ICD-10-CM | POA: Diagnosis not present

## 2021-07-05 DIAGNOSIS — Z86718 Personal history of other venous thrombosis and embolism: Secondary | ICD-10-CM | POA: Diagnosis not present

## 2021-07-05 DIAGNOSIS — Z8249 Family history of ischemic heart disease and other diseases of the circulatory system: Secondary | ICD-10-CM | POA: Diagnosis not present

## 2021-07-05 DIAGNOSIS — Z87891 Personal history of nicotine dependence: Secondary | ICD-10-CM | POA: Diagnosis not present

## 2021-07-05 LAB — BASIC METABOLIC PANEL
Anion gap: 9 (ref 5–15)
BUN: 13 mg/dL (ref 8–23)
CO2: 24 mmol/L (ref 22–32)
Calcium: 10 mg/dL (ref 8.9–10.3)
Chloride: 104 mmol/L (ref 98–111)
Creatinine, Ser: 0.89 mg/dL (ref 0.61–1.24)
GFR, Estimated: >60 mL/min (ref >60)
Glucose, Bld: 137 mg/dL — ABNORMAL HIGH (ref 70–99)
Potassium: 3.5 mmol/L (ref 3.5–5.1)
Sodium: 137 mmol/L (ref 135–145)

## 2021-07-05 LAB — HEMOGLOBIN A1C
Hgb A1c MFr Bld: 5.7 % — ABNORMAL HIGH (ref 4.8–5.6)
Mean Plasma Glucose: 116.89 mg/dL

## 2021-07-05 LAB — LIPID PANEL
Cholesterol: 212 mg/dL — ABNORMAL HIGH (ref 0–200)
HDL: 48 mg/dL (ref >40)
LDL Cholesterol: 155 mg/dL — ABNORMAL HIGH (ref 0–99)
Total CHOL/HDL Ratio: 4.4 RATIO
Triglycerides: 47 mg/dL (ref <150)
VLDL: 9 mg/dL (ref 0–40)

## 2021-07-05 LAB — CBC
HCT: 43.1 % (ref 39.0–52.0)
Hemoglobin: 14.5 g/dL (ref 13.0–17.0)
MCH: 31.1 pg (ref 26.0–34.0)
MCHC: 33.6 g/dL (ref 30.0–36.0)
MCV: 92.5 fL (ref 80.0–100.0)
Platelets: 213 10*3/uL (ref 150–400)
RBC: 4.66 MIL/uL (ref 4.22–5.81)
RDW: 12.7 % (ref 11.5–15.5)
WBC: 15.3 10*3/uL — ABNORMAL HIGH (ref 4.0–10.5)
nRBC: 0 % (ref 0.0–0.2)

## 2021-07-05 MED ORDER — SODIUM CHLORIDE 0.9% FLUSH: 3.0000 mL | INTRAVENOUS | Status: DC | PRN

## 2021-07-05 MED ORDER — SODIUM CHLORIDE 0.9 % WEIGHT BASED INFUSION
3.0000 mL/kg/h | INTRAVENOUS | Status: AC
  Administered 2021-07-06: 3 mL/kg/h via INTRAVENOUS

## 2021-07-05 MED ORDER — SODIUM CHLORIDE 0.9% FLUSH
3.0000 mL | Freq: Two times a day (BID) | INTRAVENOUS | Status: DC
  Administered 2021-07-06: 3 mL via INTRAVENOUS

## 2021-07-05 MED ORDER — SODIUM CHLORIDE 0.9 % IV SOLN: 250.0000 mL | INTRAVENOUS | Status: DC | PRN

## 2021-07-05 MED ORDER — LOSARTAN POTASSIUM 25 MG PO TABS
25.0000 mg | ORAL_TABLET | Freq: Every day | ORAL | Status: DC
  Administered 2021-07-05 – 2021-07-06 (×2): 25 mg via ORAL
  Filled 2021-07-05 (×2): qty 1

## 2021-07-05 MED ORDER — ACETAMINOPHEN 500 MG PO TABS
1000.0000 mg | ORAL_TABLET | Freq: Once | ORAL | Status: AC
  Administered 2021-07-05: 1000 mg via ORAL
  Filled 2021-07-05: qty 2

## 2021-07-05 MED ORDER — SODIUM CHLORIDE 0.9 % WEIGHT BASED INFUSION
1.0000 mL/kg/h | INTRAVENOUS | Status: DC
  Administered 2021-07-06: 1 mL/kg/h via INTRAVENOUS

## 2021-07-05 MED ORDER — CARVEDILOL 3.125 MG PO TABS
3.1250 mg | ORAL_TABLET | Freq: Two times a day (BID) | ORAL | Status: DC
  Administered 2021-07-05: 3.125 mg via ORAL
  Filled 2021-07-05: qty 1

## 2021-07-05 MED ORDER — CARVEDILOL 6.25 MG PO TABS
6.2500 mg | ORAL_TABLET | Freq: Two times a day (BID) | ORAL | Status: DC
  Administered 2021-07-05 – 2021-07-06 (×2): 6.25 mg via ORAL
  Filled 2021-07-05 (×2): qty 1

## 2021-07-05 NOTE — Progress Notes (Signed)
CARDIAC REHAB PHASE I   Went to see pt. Pt very distracted, unable to focus at this time. Set-up to brush teeth. C/o HA, RN aware. Will f/u later to complete education.  Reynold Bowen, RN BSN 07/05/2021 09:30 AM

## 2021-07-05 NOTE — Progress Notes (Signed)
TR BAND REMOVAL  LOCATION:    right radial  DEFLATED PER PROTOCOL:    Yes.    TIME BAND OFF / DRESSING APPLIED:    2145   SITE UPON ARRIVAL:    Level 0  SITE AFTER BAND REMOVAL:    Level 0  CIRCULATION SENSATION AND MOVEMENT:    Within Normal Limits   Yes.    COMMENTS:   Pt.tolerated well

## 2021-07-05 NOTE — Progress Notes (Signed)
CARDIAC REHAB PHASE I   PRE:  Rate/Rhythm: 70 SR     SaO2: 100 RA  MODE:  Ambulation: 300 ft   POST:  Rate/Rhythm: 80 SR  BP:  Sitting: 134/73    SaO2: 97 RA   Pt ambulated 351ft in hallway assist of one with gait belt. Pt denies CP, SOB, or dizziness. Pt returned to recliner. Pt educated on importance of ASA and Brilinta. Pt given MI book along with heart healthy diet. Reviewed site care and restrictions. Encouraged continued ambulation with emphasis on safety. Will refer to CRP II [place] with knowledge pt needs staged intervention.    [id number] Reynold Bowen, RN BSN 07/05/2021 11:55 AM

## 2021-07-05 NOTE — Progress Notes (Addendum)
Progress Note  Patient Name: Billy Hunt Date of Encounter: 07/05/2021  [facility] Cardiologist: Dietrich Pates, MD   Subjective   No acute overnight events. He denies any recurrent chest pain. No shortness of breath. He was having some nausea/vomiting earlier but this has improved. Only complaint right now is a headache. Will order Tylenol for this.  Inpatient Medications    Scheduled Meds:  aspirin EC  81 mg Oral Daily   atorvastatin  80 mg Oral q1800   enoxaparin (LOVENOX) injection  40 mg Subcutaneous Q24H   gabapentin  300 mg Oral TID   sodium chloride flush  3 mL Intravenous Q12H   ticagrelor  90 mg Oral BID   venlafaxine XR  150 mg Oral Daily   Continuous Infusions:  sodium chloride     nitroGLYCERIN 40 mcg/min (07/05/21 0725)   promethazine (PHENERGAN) injection (IM or IVPB) 12.5 mg (07/04/21 1921)   PRN Meds: sodium chloride, acetaminophen, nitroGLYCERIN, ondansetron (ZOFRAN) IV, promethazine (PHENERGAN) injection (IM or IVPB), sodium chloride flush, zolpidem   Vital Signs    Vitals:   07/04/21 1944 07/05/21 0027 07/05/21 0435 07/05/21 0808  BP: (!) 166/76 (!) 162/81 (!) 142/77 (!) 158/84  Pulse: 70 83 65 69  Resp: 18 18 18 20   Temp: 98.6 F (37 C) 98.5 F (36.9 C) 98.2 F (36.8 C)   TempSrc: Oral Oral Oral   SpO2: 95% 94% 92% 92%  Weight:      Height:        Intake/Output Summary (Last 24 hours) at 07/05/2021 1046 Last data filed at 07/05/2021 0535 Gross per 24 hour  Intake 441.07 ml  Output 1055 ml  Net -613.93 ml   Last 3 Weights 07/04/2021 06/08/2021 06/01/2021  Weight (lbs) 175 lb 177 lb 177 lb 0.6 oz  Weight (kg) 79.379 kg 80.287 kg 80.305 kg      Telemetry    Sinus rhythm with rates in the 60s to 70s. Frequent PACs/ PVCs. - Personally Reviewed  ECG    Normal sinus rhythm, rate 74 bpm, with RBBB, ST depression in V2, and T wave inversion in V3-V5. - Personally Reviewed  Physical Exam   GEN: No acute distress.   Neck: No  JVD. Cardiac: RRR. No murmurs, rubs, or gallops. Right radial cath site soft with no signs of hematoma. Respiratory: Clear to auscultation bilaterally. No wheezes, rhonchi, or rales. GI: Soft, non-distended, and non-tender. MS: No lower extremity edema. No deformity. Skin: Warm and dry. Neuro:  No focal deficits. Psych: Normal affect. Responds appropriately.  Labs    High Sensitivity Troponin:   Recent Labs  Lab 07/04/21 0034 07/04/21 0231  TROPONINIHS 429* 386*     Chemistry Recent Labs  Lab 07/04/21 0034 07/05/21 0320  NA 138 137  K 3.7 3.5  CL 102 104  CO2 24 24  GLUCOSE 142* 137*  BUN 18 13  CREATININE 0.91 0.89  CALCIUM 10.8* 10.0  GFRNONAA >60 >60  ANIONGAP 12 9    Lipids  Recent Labs  Lab 07/05/21 0320  CHOL 212*  TRIG 47  HDL 48  LDLCALC 155*  CHOLHDL 4.4    Hematology Recent Labs  Lab 07/04/21 0034 07/05/21 0320  WBC 11.1* 15.3*  RBC 5.47 4.66  HGB 17.2* 14.5  HCT 51.3 43.1  MCV 93.8 92.5  MCH 31.4 31.1  MCHC 33.5 33.6  RDW 12.7 12.7  PLT 235 213   Thyroid No results for input(s): TSH, FREET4 in the last 168 hours.  BNPNo results for input(s): BNP, PROBNP in the last 168 hours.  DDimer No results for input(s): DDIMER in the last 168 hours.   Radiology    CARDIAC CATHETERIZATION  Result Date: 07/04/2021 Conclusions: Significant multivessel coronary artery disease, as detailed below.  Culprit lesion for the patient's NSTEMI is likely occluded ostial/proximal OM2 branch.  There is also severe mid LAD and D1 disease. Low normal left ventricular filling pressure (LVEDP ~5 mmHg). Successful PCI to OM2 using Onyx Frontier 2.5 x 26 mm drug-eluting stent with 0% residual stenosis and TIMI-3 flow. Recommendations: Continue cangrelor infusion for 2 hours following ticagrelor load. Aggressive secondary prevention. Wean IV nitroglycerin as tolerated. Consider staged PCI to mid LAD; timing (during this admission versus as an outpatient in the next few  weeks) to be determined based on symptoms. Yvonne Kendall, MD [facility]  DG Chest Port 1 View  Result Date: 07/04/2021 CLINICAL DATA:  Chest pain. EXAM: PORTABLE CHEST 1 VIEW COMPARISON:  Chest radiograph dated 04/20/2015 and CT dated 07/27/2017. FINDINGS: There is mild eventration of the right hemidiaphragm. No focal consolidation, pleural effusion or pneumothorax. The cardiac silhouette is within limits. No acute osseous pathology. IMPRESSION: No active disease. Electronically Signed   By: Elgie Collard M.D.   On: 07/04/2021 01:03   ECHOCARDIOGRAM COMPLETE  Result Date: 07/04/2021    ECHOCARDIOGRAM REPORT   Patient Name:   Billy Hunt Date of Exam: 07/04/2021 Medical Rec #:  [id number]      Height:       69.0 in Accession #:    [id number]     Weight:       175.0 lb Date of Birth:  12/01/1945     BSA:          1.952 m Patient Age:    75 years       BP:           182/96 mmHg Patient Gender: M              HR:           51 bpm. [facility] Procedure: 2D Echo, Cardiac Doppler and Color Doppler Indications:    NSTEMI I21.4  History:        Patient has prior history of Echocardiogram examinations, most                 recent 04/21/2015. Risk Factors:Dyslipidemia and Hypertension.                 Pulmonary emboli (HCC) (From Hx).  Sonographer:    Celesta Gentile RCS Referring Phys: [id number] MICHELE M LENZE IMPRESSIONS  1. Left ventricular ejection fraction, by estimation, is 60 to 65%. The left ventricle has normal function. The left ventricle has no regional wall motion abnormalities. The left ventricular internal cavity size was mildly dilated. There is mild left ventricular hypertrophy. Left ventricular diastolic parameters are consistent with Grade I diastolic dysfunction (impaired relaxation).  2. Right ventricular systolic function is normal. The right ventricular size is normal. There is mildly elevated pulmonary artery systolic pressure.  3. Left atrial size was mildly dilated.  4.  The mitral valve is normal in structure. Mild mitral valve regurgitation.  5. The aortic valve is tricuspid. Aortic valve regurgitation is mild. Mild aortic valve sclerosis is present, with no evidence of aortic valve stenosis. Comparison(s): The left ventricular function is unchanged. FINDINGS  Left Ventricle: Left ventricular ejection fraction, by estimation, is 60 to 65%. The left ventricle  has normal function. The left ventricle has no regional wall motion abnormalities. The left ventricular internal cavity size was mildly dilated. There is  mild left ventricular hypertrophy. Left ventricular diastolic parameters are consistent with Grade I diastolic dysfunction (impaired relaxation). Right Ventricle: The right ventricular size is normal. Right vetricular wall thickness was not assessed. Right ventricular systolic function is normal. There is mildly elevated pulmonary artery systolic pressure. The tricuspid regurgitant velocity is 3.00 m/s, and with an assumed right atrial pressure of 3 mmHg, the estimated right ventricular systolic pressure is 39.0 mmHg. Left Atrium: Left atrial size was mildly dilated. Right Atrium: Right atrial size was normal in size. Pericardium: There is no evidence of pericardial effusion. Mitral Valve: The mitral valve is normal in structure. Mild mitral valve regurgitation. Tricuspid Valve: The tricuspid valve is normal in structure. Tricuspid valve regurgitation is trivial. Aortic Valve: The aortic valve is tricuspid. Aortic valve regurgitation is mild. Aortic regurgitation PHT measures 561 msec. Mild aortic valve sclerosis is present, with no evidence of aortic valve stenosis. Pulmonic Valve: The pulmonic valve was normal in structure. Pulmonic valve regurgitation is not visualized. Aorta: The aortic root and ascending aorta are structurally normal, with no evidence of dilitation. IAS/Shunts: No atrial level shunt detected by color flow Doppler.  LEFT VENTRICLE PLAX 2D LVIDd:          5.20 cm   Diastology LVIDs:         3.70 cm   LV e' medial:    3.48 cm/s LV PW:         1.10 cm   LV E/e' medial:  11.7 LV IVS:        1.30 cm   LV e' lateral:   4.35 cm/s LVOT diam:     2.20 cm   LV E/e' lateral: 9.4 LV SV:         105 LV SV Index:   54 LVOT Area:     3.80 cm  RIGHT VENTRICLE RV S prime:     20.30 cm/s TAPSE (M-mode): 1.7 cm LEFT ATRIUM             Index        RIGHT ATRIUM           Index LA diam:        4.00 cm 2.05 cm/m   RA Area:     12.80 cm LA Vol (A2C):   83.6 ml 42.83 ml/m  RA Volume:   26.20 ml  13.42 ml/m LA Vol (A4C):   77.7 ml 39.81 ml/m LA Biplane Vol: 83.1 ml 42.57 ml/m  AORTIC VALVE LVOT Vmax:   132.00 cm/s LVOT Vmean:  87.000 cm/s LVOT VTI:    0.275 m AI PHT:      561 msec  AORTA Ao Root diam: 4.30 cm MITRAL VALVE               TRICUSPID VALVE MV Area (PHT): 4.06 cm    TR Peak grad:   36.0 mmHg MV Decel Time: 187 msec    TR Vmax:        300.00 cm/s MV E velocity: 40.70 cm/s MV A velocity: 73.70 cm/s  SHUNTS MV E/A ratio:  0.55        Systemic VTI:  0.28 m                            Systemic Diam: 2.20 cm Dietrich Pates MD Electronically  signed by Dietrich Pates MD Signature Date/Time: 07/04/2021/4:05:40 PM    Final     Cardiac Studies   Echocardiogram 07/04/2021: Impressions: 1. Left ventricular ejection fraction, by estimation, is 60 to 65%. The  left ventricle has normal function. The left ventricle has no regional  wall motion abnormalities. The left ventricular internal cavity size was  mildly dilated. There is mild left  ventricular hypertrophy. Left ventricular diastolic parameters are  consistent with Grade I diastolic dysfunction (impaired relaxation).   2. Right ventricular systolic function is normal. The right ventricular  size is normal. There is mildly elevated pulmonary artery systolic  pressure.   3. Left atrial size was mildly dilated.   4. The mitral valve is normal in structure. Mild mitral valve  regurgitation.   5. The aortic valve is tricuspid.  Aortic valve regurgitation is mild.  Mild aortic valve sclerosis is present, with no evidence of aortic valve  stenosis.   Comparison(s): The left ventricular function is unchanged.  _______________  Left Cardiac Catheterization 07/04/2021: Conclusions: Significant multivessel coronary artery disease, as detailed below.  Culprit lesion for the patient's NSTEMI is likely occluded ostial/proximal OM2 branch.  There is also severe mid LAD and D1 disease. Low normal left ventricular filling pressure (LVEDP ~5 mmHg). Successful PCI to OM2 using Onyx Frontier 2.5 x 26 mm drug-eluting stent with 0% residual stenosis and TIMI-3 flow.   Recommendations: Continue cangrelor infusion for 2 hours following ticagrelor load. Aggressive secondary prevention. Wean IV nitroglycerin as tolerated. Consider staged PCI to mid LAD; timing (during this admission versus as an outpatient in the next few weeks) to be determined based on symptoms. Diagnostic Dominance: Right Intervention    Patient Profile     75 y.o. male with a history of hyperlipidemia, prior DVT/PE about 6 year ago possibly secondary to testosterone, COPD, and remote smoking history who was admitted on 07/04/2021 with NSTEMI.  Assessment & Plan    NSTEMI - High-sensitivity troponin peaked at 429.  - Echo showed LVEF of 60-65% with normal wall motion, grade 1 diastolic dysfunction, mild MR, and mild AI. - LHC yesterday showed occluded ostial/proximal OM2 (culprit) lesion as well as severe mid LAD and D1 disease. Patient underwent successful PCI with DES to OM2 lesion. Stage PCI to mid LAD is being considered. - Patient denies any recurrent chest pain. He would prefer to come back for staged PCI if possible. Will wean off IV Nitro and then ambulate him to see if he has any recurrent pain. - Continue DAPT with Aspirin 81mg  daily and Brilinta 90mg  twice daily. - Will start Coreg 3.125mg  twice daily given frequent ectopy. - Started on  Lipitor 80mg  daily yesterday. Continue  Hypertension Urgency - BP markedly elevated no arrival in the 200s/90s. Improved on IV Nitro but still elevated. - Will try to wean off IV Nitro. - Will start Coreg 3.125mg  twice daily and Losartan 25mg  daily.  Hyperlipidemia - Lipid panel this admission: Total Cholesterol 212, Triglycerides 47, HDL 48, LDL 155. - LDL goal <70 given CAD. - Started Lipitor 80mg  daily. Continue. - Will need repeat lipid panel and LFTs in 6-8 weeks.  Leukocytosis - WBC 11.1 on admission and 15.3 today. - Afebrile.  - Chest x-ray showed no acute findings. - Possible reactive leukocytosis from MI.   Disposition: Suspect he may need another day in the hospital. BP is still elevated and he is still on IV Nitro drip. Will try to wean off Nitro drip and add oral antihypertensives as  above. Once off Nitro, have asked RN to ambulate patient to see if he has any recurrent chest pain. MD to follow with additional recommendations.  For questions or updates, please contact [facility] Please consult [email or web address] for contact info under     Signed, Corrin Parker, PA-C  07/05/2021, 10:46 AM    Patient seen, examined. Available data reviewed. Agree with findings, assessment, and plan as outlined by Marjie Skiff, PA-C.  The patient is sitting up in a chair and feeling well.  He denies chest pain or shortness of breath.  His daughter is at the bedside.  HEENT is normal, lungs are clear, heart is regular rate and rhythm no murmur gallop, abdomen is soft nontender, right radial site is clear with no hematoma or ecchymosis, legs have no edema, skin is warm and dry with no rash.  The patient's coronary angiogram from yesterday is again reviewed.  We discussed consideration about further revascularization of the LAD.  He prefers to proceed with staged PCI prior to discharge.  We will schedule for tomorrow.  I have reviewed risks, indications, and alternatives with the patient  today.  He understands the risks of PCI include stroke, myocardial infarction, vascular injury, bleeding, coronary dissection, perforation, cardiac tamponade, arrhythmia, emergency cardiac surgery, radiation injury, kidney injury, and death.  He understands these risks occur at low frequency of 1% or less.  He is willing to proceed.  Tonny Bollman, M.D. 07/05/2021 3:47 PM

## 2021-07-05 NOTE — TOC Benefit Eligibility Note (Signed)
Patient Product/process development scientist completed.    The patient is currently admitted and upon discharge could be taking Brilinta 90 mg.  The current 30 day co-pay is, $459.68 due to a $472.20 deductible remaining.   The patient is insured through [employer] Part D    Roland Earl, CPhT Pharmacy Patient Advocate Specialist [facility] Antimicrobial Stewardship Team Direct Number: [phone]  Fax: [phone]

## 2021-07-06 ENCOUNTER — Other Ambulatory Visit (HOSPITAL_COMMUNITY): Payer: Self-pay

## 2021-07-06 ENCOUNTER — Encounter (HOSPITAL_COMMUNITY)
Admission: EM | Disposition: A | Discharge status: Home / Self Care | Payer: Self-pay | Source: Home / Self Care | Attending: Internal Medicine

## 2021-07-06 ENCOUNTER — Telehealth (HOSPITAL_COMMUNITY): Payer: Self-pay | Admitting: Pharmacist

## 2021-07-06 DIAGNOSIS — I160 Hypertensive urgency: Secondary | ICD-10-CM

## 2021-07-06 DIAGNOSIS — I214 Non-ST elevation (NSTEMI) myocardial infarction: Secondary | ICD-10-CM | POA: Diagnosis not present

## 2021-07-06 DIAGNOSIS — E782 Mixed hyperlipidemia: Secondary | ICD-10-CM

## 2021-07-06 DIAGNOSIS — I25118 Atherosclerotic heart disease of native coronary artery with other forms of angina pectoris: Secondary | ICD-10-CM | POA: Diagnosis not present

## 2021-07-06 DIAGNOSIS — I25110 Atherosclerotic heart disease of native coronary artery with unstable angina pectoris: Secondary | ICD-10-CM

## 2021-07-06 HISTORY — PX: CORONARY STENT INTERVENTION: CATH118234

## 2021-07-06 LAB — CBC
HCT: 42.6 % (ref 39.0–52.0)
Hemoglobin: 14 g/dL (ref 13.0–17.0)
MCH: 30.8 pg (ref 26.0–34.0)
MCHC: 32.9 g/dL (ref 30.0–36.0)
MCV: 93.8 fL (ref 80.0–100.0)
Platelets: 193 10*3/uL (ref 150–400)
RBC: 4.54 MIL/uL (ref 4.22–5.81)
RDW: 13 % (ref 11.5–15.5)
WBC: 12.1 10*3/uL — ABNORMAL HIGH (ref 4.0–10.5)
nRBC: 0 % (ref 0.0–0.2)

## 2021-07-06 LAB — POCT ACTIVATED CLOTTING TIME
Activated Clotting Time: 248 seconds
Activated Clotting Time: 254 seconds
Activated Clotting Time: 480 seconds

## 2021-07-06 SURGERY — CORONARY STENT INTERVENTION
Anesthesia: LOCAL

## 2021-07-06 MED ORDER — NITROGLYCERIN 1 MG/10 ML FOR IR/CATH LAB
INTRA_ARTERIAL | Status: DC | PRN
  Administered 2021-07-06 (×3): 200 ug via INTRACORONARY

## 2021-07-06 MED ORDER — HYDRALAZINE HCL 20 MG/ML IJ SOLN: 10.0000 mg | INTRAMUSCULAR | Status: DC | PRN

## 2021-07-06 MED ORDER — FENTANYL CITRATE (PF) 100 MCG/2ML IJ SOLN
INTRAMUSCULAR | Status: DC | PRN
  Administered 2021-07-06: 50 ug via INTRAVENOUS

## 2021-07-06 MED ORDER — ASPIRIN 81 MG PO TBEC
81.0000 mg | DELAYED_RELEASE_TABLET | Freq: Every day | ORAL | 1 refills | Status: DC
  Filled 2021-07-06: qty 90, 90d supply, fill #0

## 2021-07-06 MED ORDER — SODIUM CHLORIDE 0.9 % IV SOLN: INTRAVENOUS | Status: DC

## 2021-07-06 MED ORDER — ONDANSETRON HCL 4 MG/2ML IJ SOLN: 4.0000 mg | Freq: Four times a day (QID) | INTRAMUSCULAR | Status: DC | PRN

## 2021-07-06 MED ORDER — SODIUM CHLORIDE 0.9 % IV SOLN: 250.0000 mL | INTRAVENOUS | Status: DC | PRN

## 2021-07-06 MED ORDER — NITROGLYCERIN 0.4 MG SL SUBL
0.4000 mg | SUBLINGUAL_TABLET | SUBLINGUAL | 2 refills | Status: AC | PRN
  Filled 2021-07-06: qty 25, 7d supply, fill #0

## 2021-07-06 MED ORDER — CARVEDILOL 6.25 MG PO TABS
6.2500 mg | ORAL_TABLET | Freq: Two times a day (BID) | ORAL | 1 refills | Status: DC
  Filled 2021-07-06: qty 60, 30d supply, fill #0

## 2021-07-06 MED ORDER — HEPARIN (PORCINE) IN NACL 1000-0.9 UT/500ML-% IV SOLN
INTRAVENOUS | Status: AC
  Filled 2021-07-06: qty 1000

## 2021-07-06 MED ORDER — HEPARIN SODIUM (PORCINE) 1000 UNIT/ML IJ SOLN
INTRAMUSCULAR | Status: AC
  Filled 2021-07-06: qty 1

## 2021-07-06 MED ORDER — LABETALOL HCL 5 MG/ML IV SOLN: 10.0000 mg | INTRAVENOUS | Status: DC | PRN

## 2021-07-06 MED ORDER — FENTANYL CITRATE (PF) 100 MCG/2ML IJ SOLN
INTRAMUSCULAR | Status: AC
  Filled 2021-07-06: qty 2

## 2021-07-06 MED ORDER — ACETAMINOPHEN 325 MG PO TABS: 650.0000 mg | ORAL_TABLET | ORAL | Status: DC | PRN

## 2021-07-06 MED ORDER — ATORVASTATIN CALCIUM 80 MG PO TABS: 80.0000 mg | ORAL_TABLET | Freq: Every day | ORAL | Status: DC

## 2021-07-06 MED ORDER — ASPIRIN 81 MG PO CHEW: 81.0000 mg | CHEWABLE_TABLET | Freq: Every day | ORAL | Status: DC

## 2021-07-06 MED ORDER — ATORVASTATIN CALCIUM 80 MG PO TABS
80.0000 mg | ORAL_TABLET | Freq: Every day | ORAL | 1 refills | Status: DC
Start: 2021-07-06 — End: 2021-10-10
  Filled 2021-07-06: qty 30, 30d supply, fill #0

## 2021-07-06 MED ORDER — NITROGLYCERIN 1 MG/10 ML FOR IR/CATH LAB
INTRA_ARTERIAL | Status: AC
  Filled 2021-07-06: qty 10

## 2021-07-06 MED ORDER — SODIUM CHLORIDE 0.9% FLUSH: 3.0000 mL | Freq: Two times a day (BID) | INTRAVENOUS | Status: DC

## 2021-07-06 MED ORDER — IOHEXOL 350 MG/ML SOLN
INTRAVENOUS | Status: DC | PRN
  Administered 2021-07-06: 140 mL

## 2021-07-06 MED ORDER — LOSARTAN POTASSIUM 25 MG PO TABS
25.0000 mg | ORAL_TABLET | Freq: Every day | ORAL | 1 refills | Status: DC
  Filled 2021-07-06: qty 30, 30d supply, fill #0

## 2021-07-06 MED ORDER — TICAGRELOR 90 MG PO TABS
90.0000 mg | ORAL_TABLET | Freq: Two times a day (BID) | ORAL | 2 refills | Status: DC
  Filled 2021-07-06: qty 60, 30d supply, fill #0

## 2021-07-06 MED ORDER — HEPARIN (PORCINE) IN NACL 1000-0.9 UT/500ML-% IV SOLN
INTRAVENOUS | Status: DC | PRN
  Administered 2021-07-06 (×2): 500 mL

## 2021-07-06 MED ORDER — MIDAZOLAM HCL 2 MG/2ML IJ SOLN
INTRAMUSCULAR | Status: AC
  Filled 2021-07-06: qty 2

## 2021-07-06 MED ORDER — HYDRALAZINE HCL 20 MG/ML IJ SOLN
INTRAMUSCULAR | Status: DC | PRN
  Administered 2021-07-06: 5 mg via INTRAVENOUS
  Administered 2021-07-06: 10 mg via INTRAVENOUS

## 2021-07-06 MED ORDER — TICAGRELOR 90 MG PO TABS: 90.0000 mg | ORAL_TABLET | Freq: Two times a day (BID) | ORAL | Status: DC

## 2021-07-06 MED ORDER — HEPARIN SODIUM (PORCINE) 1000 UNIT/ML IJ SOLN
INTRAMUSCULAR | Status: DC | PRN
  Administered 2021-07-06: 8000 [IU] via INTRAVENOUS
  Administered 2021-07-06 (×2): 2000 [IU] via INTRAVENOUS

## 2021-07-06 MED ORDER — LIDOCAINE HCL (PF) 1 % IJ SOLN
INTRAMUSCULAR | Status: DC | PRN
  Administered 2021-07-06: 2 mL

## 2021-07-06 MED ORDER — DIAZEPAM 5 MG PO TABS: 5.0000 mg | ORAL_TABLET | Freq: Four times a day (QID) | ORAL | Status: DC | PRN

## 2021-07-06 MED ORDER — SODIUM CHLORIDE 0.9% FLUSH: 3.0000 mL | INTRAVENOUS | Status: DC | PRN

## 2021-07-06 MED ORDER — LIDOCAINE HCL (PF) 1 % IJ SOLN
INTRAMUSCULAR | Status: AC
  Filled 2021-07-06: qty 30

## 2021-07-06 MED ORDER — MIDAZOLAM HCL 2 MG/2ML IJ SOLN
INTRAMUSCULAR | Status: DC | PRN
  Administered 2021-07-06: 2 mg via INTRAVENOUS

## 2021-07-06 MED ORDER — VERAPAMIL HCL 2.5 MG/ML IV SOLN
INTRAVENOUS | Status: DC | PRN
  Administered 2021-07-06: 10 mL via INTRA_ARTERIAL

## 2021-07-06 MED ORDER — VERAPAMIL HCL 2.5 MG/ML IV SOLN
INTRAVENOUS | Status: AC
  Filled 2021-07-06: qty 2

## 2021-07-06 SURGICAL SUPPLY — 18 items
BALLN EUPHORA RX 2.0X15 (BALLOONS) ×2
BALLN SAPPHIRE ~~LOC~~ 2.75X18 (BALLOONS) ×1 IMPLANT
BALLOON EUPHORA RX 2.0X15 (BALLOONS) IMPLANT
CATH INFINITI JR4 5F (CATHETERS) ×1 IMPLANT
CATH VISTA GUIDE 6FR XBLAD3.5 (CATHETERS) ×1 IMPLANT
DEVICE RAD COMP TR BAND LRG (VASCULAR PRODUCTS) ×1 IMPLANT
ELECT DEFIB PAD ADLT CADENCE (PAD) ×1 IMPLANT
GLIDESHEATH SLEND SS 6F .021 (SHEATH) ×1 IMPLANT
GUIDEWIRE INQWIRE 1.5J.035X260 (WIRE) IMPLANT
INQWIRE 1.5J .035X260CM (WIRE) ×2
KIT ENCORE 26 ADVANTAGE (KITS) ×1 IMPLANT
KIT HEART LEFT (KITS) ×2 IMPLANT
PACK CARDIAC CATHETERIZATION (CUSTOM PROCEDURE TRAY) ×2 IMPLANT
SHEATH PROBE COVER 6X72 (BAG) ×1 IMPLANT
STENT ONYX FRONTIER 2.5X38 (Permanent Stent) ×1 IMPLANT
TRANSDUCER W/STOPCOCK (MISCELLANEOUS) ×2 IMPLANT
TUBING CIL FLEX 10 FLL-RA (TUBING) ×2 IMPLANT
WIRE COUGAR XT STRL 190CM (WIRE) ×1 IMPLANT

## 2021-07-06 NOTE — Discharge Summary (Signed)
Discharge Summary    Patient ID: Billy Hunt MRN: [id number]; DOB: Jul 11, 1946  Admit date: 07/04/2021 Discharge date: 07/06/2021  PCP:  Anabel Halon, MD   [facility] Providers  Cardiologist:  Dietrich Pates, MD    Discharge Diagnoses    Principal Problem:   NSTEMI (non-ST elevated myocardial infarction) [facility]) Active Problems:   HTN (hypertension)   Hyperlipidemia   Hyperparathyroidism, primary (HCC)   Acute coronary syndrome [facility])   Coronary artery disease involving native coronary artery of native heart with unstable angina pectoris [facility])   Diagnostic Studies/Procedures    Cath: 07/04/21  Conclusions: Significant multivessel coronary artery disease, as detailed below.  Culprit lesion for the patient's NSTEMI is likely occluded ostial/proximal OM2 branch.  There is also severe mid LAD and D1 disease. Low normal left ventricular filling pressure (LVEDP ~5 mmHg). Successful PCI to OM2 using Onyx Frontier 2.5 x 26 mm drug-eluting stent with 0% residual stenosis and TIMI-3 flow.   Recommendations: Continue cangrelor infusion for 2 hours following ticagrelor load. Aggressive secondary prevention. Wean IV nitroglycerin as tolerated. Consider staged PCI to mid LAD; timing (during this admission versus as an outpatient in the next few weeks) to be determined based on symptoms.   Yvonne Kendall, MD  Diagnostic Dominance: Right Intervention    Cath: 07/06/21    Ost LM to Mid LM lesion is 20% stenosed.   Prox LAD lesion is 30% stenosed.   Mid LAD-2 lesion is 70% stenosed.   Prox Cx to Mid Cx lesion is 40% stenosed.   1st Diag-1 lesion is 30% stenosed.   1st Diag-2 lesion is 70% stenosed.   2nd Diag lesion is 80% stenosed.   1st Mrg lesion is 70% stenosed.   Mid LAD-1 lesion is 90% stenosed.   Mid LAD-3 lesion is 60% stenosed.   Non-stenotic 2nd Mrg lesion was previously treated.   A drug-eluting stent was successfully placed.   Post intervention, there is a  0% residual stenosis.   Post intervention, there is a 0% residual stenosis.   Post intervention, there is a 0% residual stenosis.   Successful staged PCI to the LAD in this patient who is 2 days status post initial presentation with ACS and total occlusion of the circumflex marginal vessel which was stented.   The LAD had diffuse disease of 90, 70 % and 60% between the 1st and second diagonal vessel which was successfully stented with a 2.5 x 38 mm Onyx frontier DES stent postdilated to 2.75 mm with the stenosis being reduced to 0%.   RECOMMENDATION: DAPT for minimum of 12 months in this patient status post ACS 2 days previously with successful stenting of the circumflex marginal vessel, and is successful staged PCI to diffusely diseased mid LAD today.  Medical therapy for concomitant RCA disease.  Aggressive lipid-lowering therapy with target LDL less than 70 and optimal blood pressure control.  Diagnostic Dominance: Right Intervention    Echo: 07/04/21  IMPRESSIONS     1. Left ventricular ejection fraction, by estimation, is 60 to 65%. The  left ventricle has normal function. The left ventricle has no regional  wall motion abnormalities. The left ventricular internal cavity size was  mildly dilated. There is mild left  ventricular hypertrophy. Left ventricular diastolic parameters are  consistent with Grade I diastolic dysfunction (impaired relaxation).   2. Right ventricular systolic function is normal. The right ventricular  size is normal. There is mildly elevated pulmonary artery systolic  pressure.   3. Left atrial  size was mildly dilated.   4. The mitral valve is normal in structure. Mild mitral valve  regurgitation.   5. The aortic valve is tricuspid. Aortic valve regurgitation is mild.  Mild aortic valve sclerosis is present, with no evidence of aortic valve  stenosis.   Comparison(s): The left ventricular function is unchanged.   FINDINGS   Left Ventricle: Left  ventricular ejection fraction, by estimation, is 60  to 65%. The left ventricle has normal function. The left ventricle has no  regional wall motion abnormalities. The left ventricular internal cavity  size was mildly dilated. There is   mild left ventricular hypertrophy. Left ventricular diastolic parameters  are consistent with Grade I diastolic dysfunction (impaired relaxation).   Right Ventricle: The right ventricular size is normal. Right vetricular  wall thickness was not assessed. Right ventricular systolic function is  normal. There is mildly elevated pulmonary artery systolic pressure. The  tricuspid regurgitant velocity is  3.00 m/s, and with an assumed right atrial pressure of 3 mmHg, the  estimated right ventricular systolic pressure is 39.0 mmHg.   Left Atrium: Left atrial size was mildly dilated.   Right Atrium: Right atrial size was normal in size.   Pericardium: There is no evidence of pericardial effusion.   Mitral Valve: The mitral valve is normal in structure. Mild mitral valve  regurgitation.   Tricuspid Valve: The tricuspid valve is normal in structure. Tricuspid  valve regurgitation is trivial.   Aortic Valve: The aortic valve is tricuspid. Aortic valve regurgitation is  mild. Aortic regurgitation PHT measures 561 msec. Mild aortic valve  sclerosis is present, with no evidence of aortic valve stenosis.   Pulmonic Valve: The pulmonic valve was normal in structure. Pulmonic valve  regurgitation is not visualized.   Aorta: The aortic root and ascending aorta are structurally normal, with  no evidence of dilitation.   IAS/Shunts: No atrial level shunt detected by color flow Doppler.      _____________   History of Present Illness     Billy Hunt is a 75 y.o. male with patient with history of HLD-previously on statin and not sure why it was stopped, DVT and pulmonary embolus 6 yrs ago ? Secondary to testosterone. Smoked age [age] and was told he had mild  COPD. Is active in his yard building rock gardens without symptoms until yest. No exertional symptoms in past.No HTN, DM or family history.   The day prior to admission he had burning chest pain, nausea and vomiting and felt bad all day. Pain would come and go unrelieved with antacids. 10 pm when he went to be it became worse and came to ED. His BP was high initally treated with IV NTG but dropped and given IV fluids. BP back up this am and having chest pain-EKG with RBBB and ST depression V2. Troponins 429, 386.   Hospital Course     NSTEMI: Underwent cardiac catheterization on 10/24 with significant multivessel disease.  Culprit lesion felt to be occluded ostial/proximal OM 2 branch which was treated with PCI/DES x1.  He was placed on DAPT with aspirin/Brilinta for minimum of 1 year.  Also had significant disease in the LAD which was planned for staged fashion.  Underwent repeat cardiac catheterization on 10/26 with successful PCI to the LAD with DES x1.  Does have concomitant disease in the RCA and small vessel branches to be treated medically.  No recurrent chest pain.  He was evaluated by cardiac rehab.  Follow-up echocardiogram showed EF  of 60 to 65%, grade 1 diastolic dysfunction, no regional wall motion abnormality.  HTN urgency: Stable at the time of discharge --Tolerated the addition of Coreg 6.25 mg twice daily, losartan 25 mg daily  HLD: LDL 155, Started on atorvastatin 80 mg daily --FLP/LFTs in 8 weeks  Patient was seen Dr. Excell Seltzer and deemed stable for discharge. Follow up in the office has been arranged. Medications sent to the [facility] pharmacy. Educated by PharmD prior to discharge.   Did the patient have an acute coronary syndrome (MI, NSTEMI, STEMI, etc) this admission?:  Yes                               AHA/ACC Clinical Performance & Quality Measures: Aspirin prescribed? - Yes ADP Receptor Inhibitor (Plavix/Clopidogrel, Brilinta/Ticagrelor or Effient/Prasugrel) prescribed (includes  medically managed patients)? - Yes Beta Blocker prescribed? - Yes High Intensity Statin (Lipitor 40-80mg  or Crestor 20-40mg ) prescribed? - Yes EF assessed during THIS hospitalization? - Yes For EF <40%, was ACEI/ARB prescribed? - Not Applicable (EF >/= 40%) For EF <40%, Aldosterone Antagonist (Spironolactone or Eplerenone) prescribed? - Not Applicable (EF >/= 40%) Cardiac Rehab Phase II ordered (including medically managed patients)? - Yes       The patient will be scheduled for a TOC follow up appointment in 10-14 days.  A message has been sent to the [facility] and Scheduling Pool at the office where the patient should be seen for follow up.  _____________  Discharge Vitals Blood pressure (!) 138/58, pulse 65, temperature 98.2 F (36.8 C), temperature source Oral, resp. rate 18, height 5\' 9"  (1.753 m), weight 79.4 kg, SpO2 98 %.  Filed Weights   07/04/21 0030  Weight: 79.4 kg    Labs & Radiologic Studies    CBC Recent Labs    07/04/21 0034 07/05/21 0320 07/06/21 0325  WBC 11.1* 15.3* 12.1*  NEUTROABS 7.3  --   --   HGB 17.2* 14.5 14.0  HCT 51.3 43.1 42.6  MCV 93.8 92.5 93.8  PLT 235 213 193   Basic Metabolic Panel Recent Labs    07/08/21 0034 07/05/21 0320  NA 138 137  K 3.7 3.5  CL 102 104  CO2 24 24  GLUCOSE 142* 137*  BUN 18 13  CREATININE 0.91 0.89  CALCIUM 10.8* 10.0   Liver Function Tests No results for input(s): AST, ALT, ALKPHOS, BILITOT, PROT, ALBUMIN in the last 72 hours. No results for input(s): LIPASE, AMYLASE in the last 72 hours. High Sensitivity Troponin:   Recent Labs  Lab 07/04/21 0034 07/04/21 0231  TROPONINIHS 429* 386*    BNP Invalid input(s): POCBNP D-Dimer No results for input(s): DDIMER in the last 72 hours. Hemoglobin A1C Recent Labs    07/05/21 1104  HGBA1C 5.7*   Fasting Lipid Panel Recent Labs    07/05/21 0320  CHOL 212*  HDL 48  LDLCALC 155*  TRIG 47  CHOLHDL 4.4   Thyroid Function Tests No results for  input(s): TSH, T4TOTAL, T3FREE, THYROIDAB in the last 72 hours.  Invalid input(s): FREET3 _____________  CARDIAC CATHETERIZATION  Result Date: 07/06/2021   Ost LM to Mid LM lesion is 20% stenosed.   Prox LAD lesion is 30% stenosed.   Mid LAD-2 lesion is 70% stenosed.   Prox Cx to Mid Cx lesion is 40% stenosed.   1st Diag-1 lesion is 30% stenosed.   1st Diag-2 lesion is 70% stenosed.   2nd Diag lesion  is 80% stenosed.   1st Mrg lesion is 70% stenosed.   Mid LAD-1 lesion is 90% stenosed.   Mid LAD-3 lesion is 60% stenosed.   Non-stenotic 2nd Mrg lesion was previously treated.   A drug-eluting stent was successfully placed.   Post intervention, there is a 0% residual stenosis.   Post intervention, there is a 0% residual stenosis.   Post intervention, there is a 0% residual stenosis. Successful staged PCI to the LAD in this patient who is 2 days status post initial presentation with ACS and total occlusion of the circumflex marginal vessel which was stented. The LAD had diffuse disease of 90, 70 % and 60% between the 1st and second diagonal vessel which was successfully stented with a 2.5 x 38 mm Onyx frontier DES stent postdilated to 2.75 mm with the stenosis being reduced to 0%. RECOMMENDATION: DAPT for minimum of 12 months in this patient status post ACS 2 days previously with successful stenting of the circumflex marginal vessel, and is successful staged PCI to diffusely diseased mid LAD today.  Medical therapy for concomitant RCA disease.  Aggressive lipid-lowering therapy with target LDL less than 70 and optimal blood pressure control.   CARDIAC CATHETERIZATION  Result Date: 07/04/2021 Conclusions: Significant multivessel coronary artery disease, as detailed below.  Culprit lesion for the patient's NSTEMI is likely occluded ostial/proximal OM2 branch.  There is also severe mid LAD and D1 disease. Low normal left ventricular filling pressure (LVEDP ~5 mmHg). Successful PCI to OM2 using Onyx Frontier 2.5  x 26 mm drug-eluting stent with 0% residual stenosis and TIMI-3 flow. Recommendations: Continue cangrelor infusion for 2 hours following ticagrelor load. Aggressive secondary prevention. Wean IV nitroglycerin as tolerated. Consider staged PCI to mid LAD; timing (during this admission versus as an outpatient in the next few weeks) to be determined based on symptoms. Yvonne Kendall, MD [facility]  DG Chest Port 1 View  Result Date: 07/04/2021 CLINICAL DATA:  Chest pain. EXAM: PORTABLE CHEST 1 VIEW COMPARISON:  Chest radiograph dated 04/20/2015 and CT dated 07/27/2017. FINDINGS: There is mild eventration of the right hemidiaphragm. No focal consolidation, pleural effusion or pneumothorax. The cardiac silhouette is within limits. No acute osseous pathology. IMPRESSION: No active disease. Electronically Signed   By: Elgie Collard M.D.   On: 07/04/2021 01:03   ECHOCARDIOGRAM COMPLETE  Result Date: 07/04/2021    ECHOCARDIOGRAM REPORT   Patient Name:   Billy Hunt Date of Exam: 07/04/2021 Medical Rec #:  [id number]      Height:       69.0 in Accession #:    [id number]     Weight:       175.0 lb Date of Birth:  11-Oct-1945     BSA:          1.952 m Patient Age:    75 years       BP:           182/96 mmHg Patient Gender: M              HR:           51 bpm. [facility] Procedure: 2D Echo, Cardiac Doppler and Color Doppler Indications:    NSTEMI I21.4  History:        Patient has prior history of Echocardiogram examinations, most                 recent 04/21/2015. Risk Factors:Dyslipidemia and Hypertension.  Pulmonary emboli (HCC) (From Hx).  Sonographer:    Celesta Gentile RCS Referring Phys: [id number] MICHELE M LENZE IMPRESSIONS  1. Left ventricular ejection fraction, by estimation, is 60 to 65%. The left ventricle has normal function. The left ventricle has no regional wall motion abnormalities. The left ventricular internal cavity size was mildly dilated. There is mild left  ventricular hypertrophy. Left ventricular diastolic parameters are consistent with Grade I diastolic dysfunction (impaired relaxation).  2. Right ventricular systolic function is normal. The right ventricular size is normal. There is mildly elevated pulmonary artery systolic pressure.  3. Left atrial size was mildly dilated.  4. The mitral valve is normal in structure. Mild mitral valve regurgitation.  5. The aortic valve is tricuspid. Aortic valve regurgitation is mild. Mild aortic valve sclerosis is present, with no evidence of aortic valve stenosis. Comparison(s): The left ventricular function is unchanged. FINDINGS  Left Ventricle: Left ventricular ejection fraction, by estimation, is 60 to 65%. The left ventricle has normal function. The left ventricle has no regional wall motion abnormalities. The left ventricular internal cavity size was mildly dilated. There is  mild left ventricular hypertrophy. Left ventricular diastolic parameters are consistent with Grade I diastolic dysfunction (impaired relaxation). Right Ventricle: The right ventricular size is normal. Right vetricular wall thickness was not assessed. Right ventricular systolic function is normal. There is mildly elevated pulmonary artery systolic pressure. The tricuspid regurgitant velocity is 3.00 m/s, and with an assumed right atrial pressure of 3 mmHg, the estimated right ventricular systolic pressure is 39.0 mmHg. Left Atrium: Left atrial size was mildly dilated. Right Atrium: Right atrial size was normal in size. Pericardium: There is no evidence of pericardial effusion. Mitral Valve: The mitral valve is normal in structure. Mild mitral valve regurgitation. Tricuspid Valve: The tricuspid valve is normal in structure. Tricuspid valve regurgitation is trivial. Aortic Valve: The aortic valve is tricuspid. Aortic valve regurgitation is mild. Aortic regurgitation PHT measures 561 msec. Mild aortic valve sclerosis is present, with no evidence of  aortic valve stenosis. Pulmonic Valve: The pulmonic valve was normal in structure. Pulmonic valve regurgitation is not visualized. Aorta: The aortic root and ascending aorta are structurally normal, with no evidence of dilitation. IAS/Shunts: No atrial level shunt detected by color flow Doppler.  LEFT VENTRICLE PLAX 2D LVIDd:         5.20 cm   Diastology LVIDs:         3.70 cm   LV e' medial:    3.48 cm/s LV PW:         1.10 cm   LV E/e' medial:  11.7 LV IVS:        1.30 cm   LV e' lateral:   4.35 cm/s LVOT diam:     2.20 cm   LV E/e' lateral: 9.4 LV SV:         105 LV SV Index:   54 LVOT Area:     3.80 cm  RIGHT VENTRICLE RV S prime:     20.30 cm/s TAPSE (M-mode): 1.7 cm LEFT ATRIUM             Index        RIGHT ATRIUM           Index LA diam:        4.00 cm 2.05 cm/m   RA Area:     12.80 cm LA Vol (A2C):   83.6 ml 42.83 ml/m  RA Volume:   26.20 ml  13.42 ml/m LA  Vol (A4C):   77.7 ml 39.81 ml/m LA Biplane Vol: 83.1 ml 42.57 ml/m  AORTIC VALVE LVOT Vmax:   132.00 cm/s LVOT Vmean:  87.000 cm/s LVOT VTI:    0.275 m AI PHT:      561 msec  AORTA Ao Root diam: 4.30 cm MITRAL VALVE               TRICUSPID VALVE MV Area (PHT): 4.06 cm    TR Peak grad:   36.0 mmHg MV Decel Time: 187 msec    TR Vmax:        300.00 cm/s MV E velocity: 40.70 cm/s MV A velocity: 73.70 cm/s  SHUNTS MV E/A ratio:  0.55        Systemic VTI:  0.28 m                            Systemic Diam: 2.20 cm Dietrich Pates MD Electronically signed by Dietrich Pates MD Signature Date/Time: 07/04/2021/4:05:40 PM    Final    Disposition   Pt is being discharged home today in good condition.  Follow-up Plans & Appointments     Follow-up Information     Netta Neat., NP Follow up on 07/15/2021.   Specialty: Cardiology Why: at 9am for your follow up appt Contact information: [address] [place] [address] [phone]                Discharge Instructions     AMB Referral to Cardiac Rehabilitation - Phase II    Complete by: As directed    Diagnosis: Coronary Stents   After initial evaluation and assessments completed: Virtual Based Care may be provided alone or in conjunction with Phase 2 Cardiac Rehab based on patient barriers.: Yes   Amb Referral to Cardiac Rehabilitation   Complete by: As directed    Diagnosis:  Coronary Stents NSTEMI     After initial evaluation and assessments completed: Virtual Based Care may be provided alone or in conjunction with Phase 2 Cardiac Rehab based on patient barriers.: Yes   Call MD for:  difficulty breathing, headache or visual disturbances   Complete by: As directed    Call MD for:  persistant dizziness or light-headedness   Complete by: As directed    Call MD for:  redness, tenderness, or signs of infection (pain, swelling, redness, odor or green/yellow discharge around incision site)   Complete by: As directed    Diet - low sodium heart healthy   Complete by: As directed    Discharge instructions   Complete by: As directed    Radial Site Care Refer to this sheet in the next few weeks. These instructions provide you with information on caring for yourself after your procedure. Your caregiver may also give you more specific instructions. Your treatment has been planned according to current medical practices, but problems sometimes occur. Call your caregiver if you have any problems or questions after your procedure. HOME CARE INSTRUCTIONS You may shower the day after the procedure. Remove the bandage (dressing) and gently wash the site with plain soap and water. Gently pat the site dry.  Do not apply powder or lotion to the site.  Do not submerge the affected site in water for 3 to 5 days.  Inspect the site at least twice daily.  Do not flex or bend the affected arm for 24 hours.  No lifting over 5 pounds (2.3  kg) for 5 days after your procedure.  Do not drive home if you are discharged the same day of the procedure. Have someone else drive you.  You may  drive 24 hours after the procedure unless otherwise instructed by your caregiver.  What to expect: Any bruising will usually fade within 1 to 2 weeks.  Blood that collects in the tissue (hematoma) may be painful to the touch. It should usually decrease in size and tenderness within 1 to 2 weeks.  SEEK IMMEDIATE MEDICAL CARE IF: You have unusual pain at the radial site.  You have redness, warmth, swelling, or pain at the radial site.  You have drainage (other than a small amount of blood on the dressing).  You have chills.  You have a fever or persistent symptoms for more than 72 hours.  You have a fever and your symptoms suddenly get worse.  Your arm becomes pale, cool, tingly, or numb.  You have heavy bleeding from the site. Hold pressure on the site.    PLEASE DO NOT MISS ANY DOSES OF YOUR BRILINTA!!!!! Also keep a log of you blood pressures and bring back to your follow up appt. Please call the office with any questions.   Patients taking blood thinners should generally stay away from medicines like ibuprofen, Advil, Motrin, naproxen, and Aleve due to risk of stomach bleeding. You may take Tylenol as directed or talk to your primary doctor about alternatives.   PLEASE ENSURE THAT YOU DO NOT RUN OUT OF YOUR BRILINTA. This medication is very important to remain on for at least one year. IF you have issues obtaining this medication due to cost please CALL the office 3-5 business days prior to running out in order to prevent missing doses of this medication.   Increase activity slowly   Complete by: As directed        Discharge Medications   Allergies as of 07/06/2021       Reactions   Sulfa Antibiotics Hives   Sulfamethoxazole Rash        Medication List     TAKE these medications    Aspirin Low Dose 81 MG EC tablet Generic drug: aspirin Take 1 tablet (81 mg total) by mouth daily. Swallow whole. Start taking on: July 07, 2021   atorvastatin 80 MG tablet Commonly  known as: LIPITOR Take 1 tablet (80 mg total) by mouth daily at 6 PM.   Brilinta 90 MG Tabs tablet Generic drug: ticagrelor Take 1 tablet (90 mg total) by mouth 2 (two) times daily.   carvedilol 6.25 MG tablet Commonly known as: COREG Take 1 tablet (6.25 mg total) by mouth 2 (two) times daily.   cholecalciferol 25 MCG (1000 UNIT) tablet Commonly known as: VITAMIN D3 Take 2,000 Units by mouth daily.   cinacalcet 30 MG tablet Commonly known as: SENSIPAR Take 1 tablet (30 mg total) by mouth daily with breakfast.   gabapentin 300 MG capsule Commonly known as: NEURONTIN Take 300 mg by mouth 3 (three) times daily.   losartan 25 MG tablet Commonly known as: COZAAR Take 1 tablet (25 mg total) by mouth daily. Start taking on: July 07, 2021   nitroGLYCERIN 0.4 MG SL tablet Commonly known as: NITROSTAT Place 1 tablet (0.4 mg total) under the tongue every 5 (five) minutes x 3 doses as needed for chest pain.   tamsulosin 0.4 MG Caps capsule Commonly known as: FLOMAX Take 1 capsule (0.4 mg total) by mouth daily.   venlafaxine XR 150  MG 24 hr capsule Commonly known as: EFFEXOR-XR Take 1 capsule (150 mg total) by mouth daily.        Outstanding Labs/Studies   FLP/LFTs in 8 weeks BMET at follow up appt  Duration of Discharge Encounter   Greater than 30 minutes including physician time.  Signed, Laverda Page, NP 07/06/2021, 3:30 PM

## 2021-07-06 NOTE — Interval H&P Note (Signed)
Cath Lab Visit (complete for each Cath Lab visit)  Clinical Evaluation Leading to the Procedure:   ACS: Yes.    Non-ACS:    Anginal Classification: CCS III  Anti-ischemic medical therapy: Minimal Therapy (1 class of medications)  Non-Invasive Test Results: No non-invasive testing performed  Prior CABG: No previous CABG      History and Physical Interval Note:  07/06/2021 11:27 AM  Billy Hunt  has presented today for surgery, with the diagnosis of cad.  The various methods of treatment have been discussed with the patient and family. After consideration of risks, benefits and other options for treatment, the patient has consented to  Procedure(s): CORONARY STENT INTERVENTION (N/A) as a surgical intervention.  The patient's history has been reviewed, patient examined, no change in status, stable for surgery.  I have reviewed the patient's chart and labs.  Questions were answered to the patient's satisfaction.     Nicki Guadalajara

## 2021-07-06 NOTE — Progress Notes (Signed)
Progress Note  Patient Name: Billy Hunt Date of Encounter: 07/06/2021  [facility] Cardiologist: Dietrich Pates, MD   Subjective   Patient feels well.  No chest pain or shortness of breath overnight.  Inpatient Medications    Scheduled Meds:  aspirin EC  81 mg Oral Daily   atorvastatin  80 mg Oral q1800   carvedilol  6.25 mg Oral BID   enoxaparin (LOVENOX) injection  40 mg Subcutaneous Q24H   gabapentin  300 mg Oral TID   losartan  25 mg Oral Daily   sodium chloride flush  3 mL Intravenous Q12H   sodium chloride flush  3 mL Intravenous Q12H   ticagrelor  90 mg Oral BID   venlafaxine XR  150 mg Oral Daily   Continuous Infusions:  sodium chloride     sodium chloride     sodium chloride 1 mL/kg/hr (07/06/21 0540)   nitroGLYCERIN 40 mcg/min (07/05/21 0725)   promethazine (PHENERGAN) injection (IM or IVPB) 12.5 mg (07/04/21 1921)   PRN Meds: sodium chloride, sodium chloride, acetaminophen, nitroGLYCERIN, ondansetron (ZOFRAN) IV, promethazine (PHENERGAN) injection (IM or IVPB), sodium chloride flush, sodium chloride flush, zolpidem   Vital Signs    Vitals:   07/05/21 2116 07/05/21 2119 07/06/21 0547 07/06/21 0802  BP: 140/74 140/74 (!) 147/83 139/85  Pulse: 65 62 64 60  Resp:  18 18   Temp:  98.5 F (36.9 C) 98.2 F (36.8 C)   TempSrc:  Oral Oral   SpO2:  94% 95%   Weight:      Height:        Intake/Output Summary (Last 24 hours) at 07/06/2021 1023 Last data filed at 07/06/2021 0807 Gross per 24 hour  Intake --  Output 605 ml  Net -605 ml   Last 3 Weights 07/04/2021 06/08/2021 06/01/2021  Weight (lbs) 175 lb 177 lb 177 lb 0.6 oz  Weight (kg) 79.379 kg 80.287 kg 80.305 kg      Telemetry    Normal sinus rhythm without significant arrhythmia- Personally Reviewed    Physical Exam  Alert, oriented, no distress GEN: No acute distress.   Neck: No JVD Cardiac: RRR, no murmurs, rubs, or gallops.  Respiratory: Clear to auscultation bilaterally. GI:  Soft, nontender, non-distended  MS: No edema; No deformity.  Right radial cath site clear with no hematoma or ecchymosis.  Radial pulse 2+. Neuro:  Nonfocal  Psych: Normal affect   Labs    High Sensitivity Troponin:   Recent Labs  Lab 07/04/21 0034 07/04/21 0231  TROPONINIHS 429* 386*     Chemistry Recent Labs  Lab 07/04/21 0034 07/05/21 0320  NA 138 137  K 3.7 3.5  CL 102 104  CO2 24 24  GLUCOSE 142* 137*  BUN 18 13  CREATININE 0.91 0.89  CALCIUM 10.8* 10.0  GFRNONAA >60 >60  ANIONGAP 12 9    Lipids  Recent Labs  Lab 07/05/21 0320  CHOL 212*  TRIG 47  HDL 48  LDLCALC 155*  CHOLHDL 4.4    Hematology Recent Labs  Lab 07/04/21 0034 07/05/21 0320 07/06/21 0325  WBC 11.1* 15.3* 12.1*  RBC 5.47 4.66 4.54  HGB 17.2* 14.5 14.0  HCT 51.3 43.1 42.6  MCV 93.8 92.5 93.8  MCH 31.4 31.1 30.8  MCHC 33.5 33.6 32.9  RDW 12.7 12.7 13.0  PLT 235 213 193   Thyroid No results for input(s): TSH, FREET4 in the last 168 hours.  BNPNo results for input(s): BNP, PROBNP in the last 168  hours.  DDimer No results for input(s): DDIMER in the last 168 hours.   Radiology    CARDIAC CATHETERIZATION  Result Date: 07/04/2021 Conclusions: Significant multivessel coronary artery disease, as detailed below.  Culprit lesion for the patient's NSTEMI is likely occluded ostial/proximal OM2 branch.  There is also severe mid LAD and D1 disease. Low normal left ventricular filling pressure (LVEDP ~5 mmHg). Successful PCI to OM2 using Onyx Frontier 2.5 x 26 mm drug-eluting stent with 0% residual stenosis and TIMI-3 flow. Recommendations: Continue cangrelor infusion for 2 hours following ticagrelor load. Aggressive secondary prevention. Wean IV nitroglycerin as tolerated. Consider staged PCI to mid LAD; timing (during this admission versus as an outpatient in the next few weeks) to be determined based on symptoms. Billy Kendall, MD [facility]   Cardiac Studies   As above  Patient  Profile     75 y.o. male with a history of hyperlipidemia, prior DVT/PE about 6 year ago possibly secondary to testosterone, COPD, and remote smoking history who was admitted on 07/04/2021 with NSTEMI.  Assessment & Plan    1.  Non-STEMI: Preserved LVEF of 60 to 65%.  Culprit vessel was total occlusion of a large obtuse marginal branch, treated with PCI.  The patient has severe residual diffuse proximal and mid LAD stenosis.  Plan for staged PCI today.  I have reviewed the risks, indications, and alternatives to cardiac catheterization, possible angioplasty, and stenting with the patient. Risks include but are not limited to bleeding, infection, vascular injury, stroke, myocardial infection, arrhythmia, kidney injury, radiation-related injury in the case of prolonged fluoroscopy use, emergency cardiac surgery, and death. The patient understands the risks of serious complication is 1-2 in 1000 with diagnostic cardiac cath and 1-2% or less with angioplasty/stenting.   As long as he has no early complications, he could be considered for same-day discharge pending procedural results.  He would like to go home today if possible. 2.  Hypertensive urgency: Blood pressure has improved on carvedilol and losartan.  Continue medicine titration as needed.  Blood pressure much better over the last 24 hours. 3.  Mixed hyperlipidemia: LDL cholesterol 155.  Started on high intensity statin drug with atorvastatin 80 mg.  Disposition: Possible discharge late today pending PCI result.  Patient stable to have his procedure today.  For questions or updates, please contact [facility] Please consult [email or web address] for contact info under        Signed, Tonny Bollman, MD  07/06/2021, 10:24 AM

## 2021-07-06 NOTE — Research (Signed)
Spoke with patient about participating in Clinical Research Study V-Inception before discharge today.  Patient was given a copy of consent.  Will Follow-up with the patient in 2 weeks post PCI.  Patient expressed interest in the study.     Evern Bio, RN BSN [facility] Cardiovascular Research & Education Direct Line: [phone]

## 2021-07-06 NOTE — Progress Notes (Signed)
Called and had Harriette M. RN cath lab was called to assess right wrist / double tr band. See assessment

## 2021-07-06 NOTE — Progress Notes (Signed)
Pt seen by APP, pharmacy, and rehab prior to DC.  Pt ambulated without difficulty or bleeding.   Discharged home with his daughters who will drive and stay with pt x 24 hrs.

## 2021-07-06 NOTE — H&P (View-Only) (Signed)
Progress Note  Patient Name: Billy Hunt Date of Encounter: 07/06/2021  [facility] Cardiologist: Dietrich Pates, MD   Subjective   Patient feels well.  No chest pain or shortness of breath overnight.  Inpatient Medications    Scheduled Meds:  aspirin EC  81 mg Oral Daily   atorvastatin  80 mg Oral q1800   carvedilol  6.25 mg Oral BID   enoxaparin (LOVENOX) injection  40 mg Subcutaneous Q24H   gabapentin  300 mg Oral TID   losartan  25 mg Oral Daily   sodium chloride flush  3 mL Intravenous Q12H   sodium chloride flush  3 mL Intravenous Q12H   ticagrelor  90 mg Oral BID   venlafaxine XR  150 mg Oral Daily   Continuous Infusions:  sodium chloride     sodium chloride     sodium chloride 1 mL/kg/hr (07/06/21 0540)   nitroGLYCERIN 40 mcg/min (07/05/21 0725)   promethazine (PHENERGAN) injection (IM or IVPB) 12.5 mg (07/04/21 1921)   PRN Meds: sodium chloride, sodium chloride, acetaminophen, nitroGLYCERIN, ondansetron (ZOFRAN) IV, promethazine (PHENERGAN) injection (IM or IVPB), sodium chloride flush, sodium chloride flush, zolpidem   Vital Signs    Vitals:   07/05/21 2116 07/05/21 2119 07/06/21 0547 07/06/21 0802  BP: 140/74 140/74 (!) 147/83 139/85  Pulse: 65 62 64 60  Resp:  18 18   Temp:  98.5 F (36.9 C) 98.2 F (36.8 C)   TempSrc:  Oral Oral   SpO2:  94% 95%   Weight:      Height:        Intake/Output Summary (Last 24 hours) at 07/06/2021 1023 Last data filed at 07/06/2021 0807 Gross per 24 hour  Intake --  Output 605 ml  Net -605 ml   Last 3 Weights 07/04/2021 06/08/2021 06/01/2021  Weight (lbs) 175 lb 177 lb 177 lb 0.6 oz  Weight (kg) 79.379 kg 80.287 kg 80.305 kg      Telemetry    Normal sinus rhythm without significant arrhythmia- Personally Reviewed    Physical Exam  Alert, oriented, no distress GEN: No acute distress.   Neck: No JVD Cardiac: RRR, no murmurs, rubs, or gallops.  Respiratory: Clear to auscultation bilaterally. GI:  Soft, nontender, non-distended  MS: No edema; No deformity.  Right radial cath site clear with no hematoma or ecchymosis.  Radial pulse 2+. Neuro:  Nonfocal  Psych: Normal affect   Labs    High Sensitivity Troponin:   Recent Labs  Lab 07/04/21 0034 07/04/21 0231  TROPONINIHS 429* 386*     Chemistry Recent Labs  Lab 07/04/21 0034 07/05/21 0320  NA 138 137  K 3.7 3.5  CL 102 104  CO2 24 24  GLUCOSE 142* 137*  BUN 18 13  CREATININE 0.91 0.89  CALCIUM 10.8* 10.0  GFRNONAA >60 >60  ANIONGAP 12 9    Lipids  Recent Labs  Lab 07/05/21 0320  CHOL 212*  TRIG 47  HDL 48  LDLCALC 155*  CHOLHDL 4.4    Hematology Recent Labs  Lab 07/04/21 0034 07/05/21 0320 07/06/21 0325  WBC 11.1* 15.3* 12.1*  RBC 5.47 4.66 4.54  HGB 17.2* 14.5 14.0  HCT 51.3 43.1 42.6  MCV 93.8 92.5 93.8  MCH 31.4 31.1 30.8  MCHC 33.5 33.6 32.9  RDW 12.7 12.7 13.0  PLT 235 213 193   Thyroid No results for input(s): TSH, FREET4 in the last 168 hours.  BNPNo results for input(s): BNP, PROBNP in the last 168  hours.  DDimer No results for input(s): DDIMER in the last 168 hours.   Radiology    CARDIAC CATHETERIZATION  Result Date: 07/04/2021 Conclusions: Significant multivessel coronary artery disease, as detailed below.  Culprit lesion for the patient's NSTEMI is likely occluded ostial/proximal OM2 branch.  There is also severe mid LAD and D1 disease. Low normal left ventricular filling pressure (LVEDP ~5 mmHg). Successful PCI to OM2 using Onyx Frontier 2.5 x 26 mm drug-eluting stent with 0% residual stenosis and TIMI-3 flow. Recommendations: Continue cangrelor infusion for 2 hours following ticagrelor load. Aggressive secondary prevention. Wean IV nitroglycerin as tolerated. Consider staged PCI to mid LAD; timing (during this admission versus as an outpatient in the next few weeks) to be determined based on symptoms. Yvonne Kendall, MD [facility]   Cardiac Studies   As above  Patient  Profile     75 y.o. male with a history of hyperlipidemia, prior DVT/PE about 6 year ago possibly secondary to testosterone, COPD, and remote smoking history who was admitted on 07/04/2021 with NSTEMI.  Assessment & Plan    1.  Non-STEMI: Preserved LVEF of 60 to 65%.  Culprit vessel was total occlusion of a large obtuse marginal branch, treated with PCI.  The patient has severe residual diffuse proximal and mid LAD stenosis.  Plan for staged PCI today.  I have reviewed the risks, indications, and alternatives to cardiac catheterization, possible angioplasty, and stenting with the patient. Risks include but are not limited to bleeding, infection, vascular injury, stroke, myocardial infection, arrhythmia, kidney injury, radiation-related injury in the case of prolonged fluoroscopy use, emergency cardiac surgery, and death. The patient understands the risks of serious complication is 1-2 in 1000 with diagnostic cardiac cath and 1-2% or less with angioplasty/stenting.   As long as he has no early complications, he could be considered for same-day discharge pending procedural results.  He would like to go home today if possible. 2.  Hypertensive urgency: Blood pressure has improved on carvedilol and losartan.  Continue medicine titration as needed.  Blood pressure much better over the last 24 hours. 3.  Mixed hyperlipidemia: LDL cholesterol 155.  Started on high intensity statin drug with atorvastatin 80 mg.  Disposition: Possible discharge late today pending PCI result.  Patient stable to have his procedure today.  For questions or updates, please contact [facility] Please consult [email or web address] for contact info under        Signed, Tonny Bollman, MD  07/06/2021, 10:24 AM

## 2021-07-06 NOTE — Progress Notes (Signed)
CARDIAC REHAB PHASE I   Went to offer to walk with pt, pt getting ready for staged procedure. Reinforced importance of ASA and Brilinta. Materials at bedside, pt denies questions or concerns. Reinforced site care, restrictions, and exercise guidelines. Referred to [facility]. Pt hopeful for d/c after procedure.  [id number] Reynold Bowen, RN BSN 07/06/2021 10:48 AM

## 2021-07-06 NOTE — Telephone Encounter (Signed)
Hello,  The Pharmacy team is conducting a discharge transitions of care quality improvement initiative. The recommendations below are for your consideration.    Welcome Billy Hunt is a 75 y.o. male (MRN: [id number], DOB: 10/22/1945) who was recently hospitalized on 07/04/2021 for ACS. They are anticipated to visit your clinic for post-discharge follow-up and may benefit from assistance with medication initiation and/or access.     Relevant medication access issues which may benefit from further intervention include: Brilinta 90 mg  The current 30 day co-pay is, $459.68 due to a $472.20 deductible remaining.  Please consider the following therapy recommendations at follow-up appointment below:   1.CAD: new stent placed 10/24 -New aspirin and Brilinta, please ensure patient is able to afford Brilinta after his deductible is met or consider change to clopidogrel 2. Hyperlipidemia - LDL 155-started atorvastatin 80mg, may need additional LDL lowering agents 3. Hypertension - adding coreg 3.125 and losartan 25 mg. - SBP had been as high as 210, follow up ability to titrate meds as able -Consider addition of amlodipine for blood pressure and anti-anginal properties if needed.  Other relevant medication issues from their recent admission include:n/a   We appreciate your assistance with the implementation of these recommendations. Please let us know if there is anything we can help you with at this time.       Thanks!   Frank Wilson PharmD., BCPS Clinical Pharmacist 07/05/2021 11:20 AM  

## 2021-07-07 ENCOUNTER — Encounter (HOSPITAL_COMMUNITY): Payer: Self-pay | Admitting: Cardiovascular Disease

## 2021-07-07 ENCOUNTER — Other Ambulatory Visit (HOSPITAL_COMMUNITY): Payer: Self-pay

## 2021-07-11 ENCOUNTER — Encounter: Payer: Self-pay | Admitting: *Deleted

## 2021-07-11 DIAGNOSIS — Z006 Encounter for examination for normal comparison and control in clinical research program: Secondary | ICD-10-CM

## 2021-07-11 NOTE — Research (Addendum)
Contacted patient to discuss enrollement into V-Inception research study.  Patient is interested but wants to discuss with his significant other before enrolling.  Will follow-up on 11/7.  Patient stated he would call once he spoke to her   1:41 PM  Patients significant other called back to schedule appointment for screening.

## 2021-07-12 ENCOUNTER — Other Ambulatory Visit: Payer: Self-pay

## 2021-07-12 ENCOUNTER — Ambulatory Visit (INDEPENDENT_AMBULATORY_CARE_PROVIDER_SITE_OTHER): Payer: Medicare Other | Admitting: Internal Medicine

## 2021-07-12 ENCOUNTER — Encounter: Payer: Self-pay | Admitting: Internal Medicine

## 2021-07-12 VITALS — BP 122/60 | HR 61 | Temp 97.8°F | Resp 18 | Ht 69.0 in | Wt 172.1 lb

## 2021-07-12 DIAGNOSIS — I25110 Atherosclerotic heart disease of native coronary artery with unstable angina pectoris: Secondary | ICD-10-CM

## 2021-07-12 DIAGNOSIS — Z9289 Personal history of other medical treatment: Secondary | ICD-10-CM

## 2021-07-12 DIAGNOSIS — E782 Mixed hyperlipidemia: Secondary | ICD-10-CM

## 2021-07-12 DIAGNOSIS — I10 Essential (primary) hypertension: Secondary | ICD-10-CM | POA: Diagnosis not present

## 2021-07-12 NOTE — Assessment & Plan Note (Signed)
Lipid profile reviewed On statin now Planning to enroll in a research study for a new cholesterol medicine

## 2021-07-12 NOTE — Progress Notes (Signed)
Established Patient Office Visit  Subjective:  Patient ID: Billy Hunt, male    DOB: 07/05/1946  Age: 75 y.o. MRN: [id number]  CC:  Chief Complaint  Patient presents with   Follow-up    6 week follow up HTN pt has only checked it once at home 128/78 yesterday     HPI Billy Hunt is a 75 y.o. male with PMH of CAD s/p stent placement, hyperparathyroidism, osteopenia, BPH, PE, HLD and anxiety who presents for follow up of HTN and after recent hospital admission.  He was recently admitted in the hospital on 10/24 for chest pain and was found to have ACS/NSTEMI.  He had PCI x2 in the hospital.  He is on aspirin, Brilinta, statin and Coreg now.  He denies any episodes of chest pain, dyspnea or palpitations since being discharged from the hospital.  HTN: BP is well-controlled.  He has started taking losartan as well. Patient denies headache, dizziness, chest pain, dyspnea or palpitations.   Past Medical History:  Diagnosis Date   Anxiety    BPH (benign prostatic hyperplasia)    Gout    Hypercholesterolemia    Hyperparathyroidism ([place])    Hypertension    Pulmonary emboli ([place])    Vitamin D deficiency    Vitamin D deficiency disease 10/07/2019    Past Surgical History:  Procedure Laterality Date   CORONARY STENT INTERVENTION N/A 07/04/2021   Procedure: CORONARY STENT INTERVENTION;  Surgeon: Nelva Bush, MD;  Location: [facility];  Service: Cardiovascular;  Laterality: N/A;   CORONARY STENT INTERVENTION N/A 07/06/2021   Procedure: CORONARY STENT INTERVENTION;  Surgeon: Troy Sine, MD;  Location: [place] [facility];  Service: Cardiovascular;  Laterality: N/A;   LEFT HEART CATH AND CORONARY ANGIOGRAPHY N/A 07/04/2021   Procedure: LEFT HEART CATH AND CORONARY ANGIOGRAPHY;  Surgeon: Nelva Bush, MD;  Location: [facility];  Service: Cardiovascular;  Laterality: N/A;   PERIPHERAL VASCULAR THROMBECTOMY     TRANSURETHRAL RESECTION OF PROSTATE      VASECTOMY      Family History  Problem Relation Age of Onset   Alzheimer's disease Mother        Vascular Dementia   Stroke Mother    Breast cancer Mother    Cancer Father        Multiple Myeloma   Hypertension Father    Deep vein thrombosis Brother    Heart disease Brother    Cancer Paternal Grandmother    Alcohol abuse Paternal Grandfather     Social History   Socioeconomic History   Marital status: Single    Spouse name: Not on file   Number of children: Not on file   Years of education: Not on file   Highest education level: Not on file  Occupational History   Not on file  Tobacco Use   Smoking status: Former    Packs/day: 1.00    Years: 10.00    Pack years: 10.00    Types: Cigarettes    Quit date: 04/21/1971    Years since quitting: 50.2   Smokeless tobacco: Never  Vaping Use   Vaping Use: Never used  Substance and Sexual Activity   Alcohol use: No    Comment: former   Drug use: Yes    Types: Marijuana    Comment: last use: 9/30   Sexual activity: Yes    Birth control/protection: Other-see comments  Other Topics Concern   Not on file  Social History Narrative  Not on file   Social Determinants of Health   Financial Resource Strain: Not on file  Food Insecurity: Not on file  Transportation Needs: Not on file  Physical Activity: Not on file  Stress: Not on file  Social Connections: Not on file  Intimate Partner Violence: Not on file    Outpatient Medications Prior to Visit  Medication Sig Dispense Refill   aspirin 81 MG EC tablet Take 1 tablet (81 mg total) by mouth daily. Swallow whole. 90 tablet 1   atorvastatin (LIPITOR) 80 MG tablet Take 1 tablet (80 mg total) by mouth daily at 6 PM. 90 tablet 1   carvedilol (COREG) 6.25 MG tablet Take 1 tablet (6.25 mg total) by mouth 2 (two) times daily. 180 tablet 1   cholecalciferol (VITAMIN D3) 25 MCG (1000 UT) tablet Take 2,000 Units by mouth daily.      cinacalcet (SENSIPAR) 30 MG tablet Take 1 tablet  (30 mg total) by mouth daily with breakfast. 30 tablet 3   gabapentin (NEURONTIN) 300 MG capsule Take 300 mg by mouth 3 (three) times daily.     losartan (COZAAR) 25 MG tablet Take 1 tablet (25 mg total) by mouth daily. 90 tablet 1   nitroGLYCERIN (NITROSTAT) 0.4 MG SL tablet Place 1 tablet (0.4 mg total) under the tongue every 5 (five) minutes x 3 doses as needed for chest pain. 25 tablet 2   tamsulosin (FLOMAX) 0.4 MG CAPS capsule Take 1 capsule (0.4 mg total) by mouth daily. 90 capsule 1   ticagrelor (BRILINTA) 90 MG TABS tablet Take 1 tablet (90 mg total) by mouth 2 (two) times daily. 180 tablet 2   venlafaxine XR (EFFEXOR-XR) 150 MG 24 hr capsule Take 1 capsule (150 mg total) by mouth daily. 90 capsule 1   No facility-administered medications prior to visit.    Allergies  Allergen Reactions   Sulfa Antibiotics Hives   Sulfamethoxazole Rash    ROS Review of Systems  Constitutional:  Negative for chills and fever.  HENT:  Negative for congestion and sore throat.   Eyes:  Negative for pain and discharge.  Respiratory:  Negative for cough and shortness of breath.   Cardiovascular:  Negative for chest pain and palpitations.  Gastrointestinal:  Negative for constipation, diarrhea, nausea and vomiting.  Endocrine: Negative for polydipsia and polyuria.  Genitourinary:  Negative for dysuria and hematuria.  Musculoskeletal:  Negative for neck pain and neck stiffness.  Skin:  Positive for color change. Negative for rash.  Neurological:  Positive for numbness. Negative for dizziness, weakness and headaches.  Psychiatric/Behavioral:  Negative for agitation and behavioral problems.      Objective:    Physical Exam Vitals reviewed.  Constitutional:      General: He is not in acute distress.    Appearance: He is not diaphoretic.  HENT:     Head: Normocephalic and atraumatic.     Nose: Nose normal.     Mouth/Throat:     Mouth: Mucous membranes are moist.  Eyes:     General: No  scleral icterus.    Extraocular Movements: Extraocular movements intact.  Cardiovascular:     Rate and Rhythm: Normal rate and regular rhythm.     Pulses: Normal pulses.     Heart sounds: Normal heart sounds. No murmur heard. Pulmonary:     Breath sounds: Normal breath sounds. No wheezing or rales.  Musculoskeletal:     Cervical back: Neck supple. No tenderness.     Right lower leg:  No edema.     Left lower leg: No edema.  Skin:    General: Skin is warm.     Findings: Bruising (B/l UE from repeated venepuncture) present. No rash.  Neurological:     General: No focal deficit present.     Mental Status: He is alert and oriented to person, place, and time.  Psychiatric:        Mood and Affect: Mood normal.        Behavior: Behavior normal.    BP 122/60 (BP Location: Left Arm, Cuff Size: Normal)   Pulse 61   Temp 97.8 F (36.6 C) (Oral)   Resp 18   Ht _0  (1.753 m)   Wt 172 lb 1.9 oz (78.1 kg)   SpO2 100%   BMI 25.42 kg/m  Wt Readings from Last 3 Encounters:  07/12/21 172 lb 1.9 oz (78.1 kg)  07/04/21 175 lb (79.4 kg)  06/08/21 177 lb (80.3 kg)     Health Maintenance Due  Topic Date Due   Zoster Vaccines- Shingrix (2 of 2) 06/14/2018   COVID-19 Vaccine (5 - Booster for Moderna series) 02/12/2021   TETANUS/TDAP  05/14/2021    There are no preventive care reminders to display for this patient.  No results found for: TSH Lab Results  Component Value Date   WBC 12.1 (H) 07/06/2021   HGB 14.0 07/06/2021   HCT 42.6 07/06/2021   MCV 93.8 07/06/2021   PLT 193 07/06/2021   Lab Results  Component Value Date   NA 137 07/05/2021   K 3.5 07/05/2021   CO2 24 07/05/2021   GLUCOSE 137 (H) 07/05/2021   BUN 13 07/05/2021   CREATININE 0.89 07/05/2021   BILITOT 0.4 11/29/2020   ALKPHOS 75 11/29/2020   AST 16 11/29/2020   ALT 11 11/29/2020   PROT 7.5 11/29/2020   ALBUMIN 4.4 11/29/2020   CALCIUM 10.0 07/05/2021   ANIONGAP 9 07/05/2021   EGFR 92 11/29/2020   Lab  Results  Component Value Date   CHOL 212 (H) 07/05/2021   Lab Results  Component Value Date   HDL 48 07/05/2021   Lab Results  Component Value Date   LDLCALC 155 (H) 07/05/2021   Lab Results  Component Value Date   TRIG 47 07/05/2021   Lab Results  Component Value Date   CHOLHDL 4.4 07/05/2021   Lab Results  Component Value Date   HGBA1C 5.7 (H) 07/05/2021      Assessment & Plan:   Problem List Items Addressed This Visit       Cardiovascular and Mediastinum   HTN (hypertension)    BP Readings from Last 1 Encounters:  07/12/21 122/60  Well-controlled with Losartan and Coreg now Counseled for compliance with the medications Advised DASH diet and moderate exercise/walking, at least 150 mins/week       Coronary artery disease involving native coronary artery of native heart with unstable angina pectoris ([place]) - Primary    S/p PCI X 2 On Aspirin, Brilinta, statin and Coreg Planning to enroll in research study for a new cholesterol medicine Followed by Cardiology        Other   Hyperlipidemia    Lipid profile reviewed On statin now Planning to enroll in a research study for a new cholesterol medicine      Hospitalization within last 30 days    For ACS/NSTEMI Hospital chart reviewed, including discharge summary Medications reconciled and reviewed with the patient       No  orders of the defined types were placed in this encounter.   Follow-up: Return in about 4 months (around 11/09/2021) for HTN and CAD.    Lindell Spar, MD

## 2021-07-12 NOTE — Assessment & Plan Note (Addendum)
S/p PCI X 2 On Aspirin, Brilinta, statin and Coreg Planning to enroll in research study for a new cholesterol medicine Followed by Cardiology

## 2021-07-12 NOTE — Assessment & Plan Note (Signed)
For ACS/NSTEMI Hospital chart reviewed, including discharge summary Medications reconciled and reviewed with the patient

## 2021-07-12 NOTE — Patient Instructions (Addendum)
Please continue taking medications as prescribed.  Please continue to follow low salt and low cholesterol diet and ambulate as tolerated. 

## 2021-07-12 NOTE — Assessment & Plan Note (Signed)
BP Readings from Last 1 Encounters:  07/12/21 122/60   Well-controlled with Losartan and Coreg now Counseled for compliance with the medications Advised DASH diet and moderate exercise/walking, at least 150 mins/week

## 2021-07-13 ENCOUNTER — Other Ambulatory Visit (HOSPITAL_BASED_OUTPATIENT_CLINIC_OR_DEPARTMENT_OTHER): Payer: Self-pay

## 2021-07-13 ENCOUNTER — Other Ambulatory Visit (HOSPITAL_COMMUNITY): Payer: Self-pay

## 2021-07-14 NOTE — Progress Notes (Signed)
Cardiology Office Note  Date: 07/15/2021   ID: Billy Hunt, DOB 03-05-1946, MRN [id number]  PCP:  Anabel Halon, MD  Cardiologist:  Dietrich Pates, MD Electrophysiologist:  None   Chief Complaint: Status post same-day PCI.  History of Present Illness: Billy Hunt is a 75 y.o. male with a history of NSTEMI, CAD, HTN, hyperparathyroidism, BPH, history of PE/DVT, HLD, gout, idiopathic peripheral neuropathy, COPD   Here for follow-up status post NSTEMI.  Recent admission on 07/04/2021.  On the day prior to admission he had chest burning/chest pain with nausea and vomiting.  Pain became worse later and he presented to the ED.  Blood pressure was elevated and was treated with IV nitroglycerin.  EKG showed right bundle branch block and ST depression V2.  Troponins were 429>> 386.  He had a cardiac catheterization on 1024 with significant multivessel disease.  Culprit lesion felt to be occluded ostial/proximal OM 2 treated with PCI/DES x1.  He was placed on DAPT therapy with aspirin and Brilinta for minimum of 1 year.  Significant disease in LAD which was planned for staged intervention.  Underwent repeat cardiac catheterization on 07/06/2021 was successful PCI to LAD with DES x1.  Early disease in RCA and small vessel branches to be treated medically.  Echocardiogram with EF of 60 to 65%, G1 DD, no wall motion abnormalities.  He had hypertensive urgency and was tolerating losartan 25 mg daily and Coreg 6.25 mg p.o. twice daily.  LDL was 155.  He was started on atorvastatin 80 mg.  To have FLP and LFTs in 8 weeks.  He states he is feeling much better since since stent placements.  He does feel a little tired and is having someactivity intolerance which she attributes to medications.  He denies any chest pain, pressure, tightness, neck, arm, back, jaw pain.  Denies any nausea, vomiting, diaphoresis.  States he feels a little dizzy/lightheaded at times but no near syncopal or syncopal episodes.  He  denies any PND, orthopnea, palpitations or arrhythmias, CVA or TIA-like symptoms.  No bleeding on DAPT therapy.  No claudication-like symptoms, DVT or PE-like symptoms.  He does have a complaint of feeling short of breath when he urinates.  He denies any hematuria.  He states he is not sure why this occurring.  Denies any issues with his right radial access site from catheterizations.     Past Medical History:  Diagnosis Date   Anxiety    BPH (benign prostatic hyperplasia)    Gout    Hypercholesterolemia    Hyperparathyroidism (HCC)    Hypertension    Pulmonary emboli (HCC)    Vitamin D deficiency    Vitamin D deficiency disease 10/07/2019    Past Surgical History:  Procedure Laterality Date   CORONARY STENT INTERVENTION N/A 07/04/2021   Procedure: CORONARY STENT INTERVENTION;  Surgeon: Yvonne Kendall, MD;  Location: [facility];  Service: Cardiovascular;  Laterality: N/A;   CORONARY STENT INTERVENTION N/A 07/06/2021   Procedure: CORONARY STENT INTERVENTION;  Surgeon: Lennette Bihari, MD;  Location: [facility];  Service: Cardiovascular;  Laterality: N/A;   LEFT HEART CATH AND CORONARY ANGIOGRAPHY N/A 07/04/2021   Procedure: LEFT HEART CATH AND CORONARY ANGIOGRAPHY;  Surgeon: Yvonne Kendall, MD;  Location: [facility];  Service: Cardiovascular;  Laterality: N/A;   PERIPHERAL VASCULAR THROMBECTOMY     TRANSURETHRAL RESECTION OF PROSTATE     VASECTOMY      Current Outpatient Medications  Medication Sig Dispense  Refill   aspirin 81 MG EC tablet Take 1 tablet (81 mg total) by mouth daily. Swallow whole. 90 tablet 1   atorvastatin (LIPITOR) 80 MG tablet Take 1 tablet (80 mg total) by mouth daily at 6 PM. 90 tablet 1   carvedilol (COREG) 6.25 MG tablet Take 1 tablet (6.25 mg total) by mouth 2 (two) times daily. 180 tablet 1   cholecalciferol (VITAMIN D3) 25 MCG (1000 UT) tablet Take 2,000 Units by mouth daily.      cinacalcet (SENSIPAR) 30 MG tablet Take 1  tablet (30 mg total) by mouth daily with breakfast. 30 tablet 3   gabapentin (NEURONTIN) 300 MG capsule Take 300 mg by mouth 3 (three) times daily.     losartan (COZAAR) 25 MG tablet Take 1 tablet (25 mg total) by mouth daily. 90 tablet 1   nitroGLYCERIN (NITROSTAT) 0.4 MG SL tablet Place 1 tablet (0.4 mg total) under the tongue every 5 (five) minutes x 3 doses as needed for chest pain. 25 tablet 2   tamsulosin (FLOMAX) 0.4 MG CAPS capsule Take 1 capsule (0.4 mg total) by mouth daily. 90 capsule 1   ticagrelor (BRILINTA) 90 MG TABS tablet Take 1 tablet (90 mg total) by mouth 2 (two) times daily. 180 tablet 2   venlafaxine XR (EFFEXOR-XR) 150 MG 24 hr capsule Take 1 capsule (150 mg total) by mouth daily. 90 capsule 1   No current facility-administered medications for this visit.   Allergies:  Sulfa antibiotics and Sulfamethoxazole   Social History: The patient  reports that he quit smoking about 50 years ago. His smoking use included cigarettes. He has a 10.00 pack-year smoking history. He has never used smokeless tobacco. He reports current drug use. Drug: Marijuana. He reports that he does not drink alcohol.   Family History: The patient's family history includes Alcohol abuse in his paternal grandfather; Alzheimer's disease in his mother; Breast cancer in his mother; Cancer in his father and paternal grandmother; Deep vein thrombosis in his brother; Heart disease in his brother; Hypertension in his father; Stroke in his mother.   ROS:  Please see the history of present illness. Otherwise, complete review of systems is positive for none.  All other systems are reviewed and negative.   Physical Exam: VS:  BP 118/70   Pulse 60   Ht 5\' 9"  (1.753 m)   Wt 169 lb 6.4 oz (76.8 kg)   SpO2 98%   BMI 25.02 kg/m , BMI Body mass index is 25.02 kg/m.  Wt Readings from Last 3 Encounters:  07/15/21 169 lb 6.4 oz (76.8 kg)  07/12/21 172 lb 1.9 oz (78.1 kg)  07/04/21 175 lb (79.4 kg)    General:  Patient appears comfortable at rest. Neck: Supple, no elevated JVP or carotid bruits, no thyromegaly. Lungs: Clear to auscultation, nonlabored breathing at rest. Cardiac: Regular rate and rhythm, no S3 or significant systolic murmur, no pericardial rub. Extremities: No pitting edema, distal pulses 2+. Skin: Warm and dry.  Right radial access site with some bruising and ecchymosis but good pulses. Musculoskeletal: No kyphosis. Neuropsychiatric: Alert and oriented x3, affect grossly appropriate.  ECG:    Recent Labwork: 11/29/2020: ALT 11; AST 16 07/05/2021: BUN 13; Creatinine, Ser 0.89; Potassium 3.5; Sodium 137 07/06/2021: Hemoglobin 14.0; Platelets 193     Component Value Date/Time   CHOL 212 (H) 07/05/2021 0320   TRIG 47 07/05/2021 0320   HDL 48 07/05/2021 0320   CHOLHDL 4.4 07/05/2021 0320   VLDL 9  07/05/2021 0320   LDLCALC 155 (H) 07/05/2021 0320    Other Studies Reviewed Today:  PCI 07/06/2021 Dr. Tresa Endo CORONARY STENT INTERVENTION   Conclusion      Ost LM to Mid LM lesion is 20% stenosed.   Prox LAD lesion is 30% stenosed.   Mid LAD-2 lesion is 70% stenosed.   Prox Cx to Mid Cx lesion is 40% stenosed.   1st Diag-1 lesion is 30% stenosed.   1st Diag-2 lesion is 70% stenosed.   2nd Diag lesion is 80% stenosed.   1st Mrg lesion is 70% stenosed.   Mid LAD-1 lesion is 90% stenosed.   Mid LAD-3 lesion is 60% stenosed.   Non-stenotic 2nd Mrg lesion was previously treated.   A drug-eluting stent was successfully placed.   Post intervention, there is a 0% residual stenosis.   Post intervention, there is a 0% residual stenosis.   Post intervention, there is a 0% residual stenosis.   Successful staged PCI to the LAD in this patient who is 2 days status post initial presentation with ACS and total occlusion of the circumflex marginal vessel which was stented.   The LAD had diffuse disease of 90, 70 % and 60% between the 1st and second diagonal vessel which was successfully  stented with a 2.5 x 38 mm Onyx frontier DES stent postdilated to 2.75 mm with the stenosis being reduced to 0%.   RECOMMENDATION: DAPT for minimum of 12 months in this patient status post ACS 2 days previously with successful stenting of the circumflex marginal vessel, and is successful staged PCI to diffusely diseased mid LAD today.  Medical therapy for concomitant RCA disease.  Aggressive lipid-lowering therapy with target LDL less than 70 and optimal blood pressure control.  Diagnostic Dominance: Right Intervention      PCI 07/04/2021 Dr. Okey Dupre CORONARY STENT INTERVENTION  LEFT HEART CATH AND CORONARY ANGIOGRAPHY   Conclusion  Conclusions: Significant multivessel coronary artery disease, as detailed below.  Culprit lesion for the patient's NSTEMI is likely occluded ostial/proximal OM2 branch.  There is also severe mid LAD and D1 disease. Low normal left ventricular filling pressure (LVEDP ~5 mmHg). Successful PCI to OM2 using Onyx Frontier 2.5 x 26 mm drug-eluting stent with 0% residual stenosis and TIMI-3 flow.   Recommendations: Continue cangrelor infusion for 2 hours following ticagrelor load. Aggressive secondary prevention. Wean IV nitroglycerin as tolerated. Consider staged PCI to mid LAD; timing (during this admission versus as an outpatient in the next few weeks) to be determined based on symptoms.    Diagnostic Dominance: Right Intervention     Echocardiogram 07/04/2021 1. Left ventricular ejection fraction, by estimation, is 60 to 65%. The  left ventricle has normal function. The left ventricle has no regional  wall motion abnormalities. The left ventricular internal cavity size was  mildly dilated. There is mild left  ventricular hypertrophy. Left ventricular diastolic parameters are  consistent with Grade I diastolic dysfunction (impaired relaxation).   2. Right ventricular systolic function is normal. The right ventricular  size is normal. There is mildly  elevated pulmonary artery systolic  pressure.   3. Left atrial size was mildly dilated.   4. The mitral valve is normal in structure. Mild mitral valve  regurgitation.   5. The aortic valve is tricuspid. Aortic valve regurgitation is mild.  Mild aortic valve sclerosis is present, with no evidence of aortic valve  stenosis.   Comparison(s): The left ventricular function is unchanged.      Assessment and  Plan:  1. NSTEMI (non-ST elevated myocardial infarction) ([place])   2. Essential hypertension   3. Mixed hyperlipidemia   4. Medication management    1. NSTEMI (non-ST elevated myocardial infarction) ([place]) History of NSTEMI status post stent placements. First stent placed on 07/04/2021 by Dr. Saunders Revel with PCI to OM 2 using Onyx 1 to 2.5 x 26 mm DES with 0% residual stenosis and TIMI-3 flow.  There was also severe mid LAD and D1 disease.  Plan was to consider PCI to mid LAD.  He underwent a staged PCI to LAD on 1026 by Dr. Claiborne Billings.  The LAD had diffuse disease of 70, 90% and 60% between the first and second diagonal vessel which was successfully stented with a 2.5 x 38 mm Onyx material device stent postdilated to 2.75 mm with stenosis being reduced to 0%.  States he is doing well without any anginal symptoms.  Continue medical therapy with aspirin 81 mg daily, Brilinta 90 mg p.o. twice daily, sublingual nitroglycerin as needed, carvedilol 6.25 mg p.o. twice daily.  2. Essential hypertension Blood pressure is well controlled today at 118/70.  Continue carvedilol 6.25 mg p.o. twice daily.  Continue losartan 25 mg daily.  3. Mixed hyperlipidemia Continue atorvastatin 80 mg p.o. daily.  Get FLP's and LFTs in 8 weeks.  Medication Adjustments/Labs and Tests Ordered: Current medicines are reviewed at length with the patient today.  Concerns regarding medicines are outlined above.   Disposition: Follow-up with Dr. Harrington Challenger or APP 6 months  Signed, Levell July, NP 07/15/2021 9:42 AM    [facility] [place], [place], [place] [address] Phone: [phone]; Fax: [phone]

## 2021-07-15 ENCOUNTER — Ambulatory Visit (INDEPENDENT_AMBULATORY_CARE_PROVIDER_SITE_OTHER): Payer: Medicare Other | Admitting: Family Medicine

## 2021-07-15 ENCOUNTER — Encounter: Payer: Self-pay | Admitting: Family Medicine

## 2021-07-15 VITALS — BP 118/70 | HR 60 | Ht 69.0 in | Wt 169.4 lb

## 2021-07-15 DIAGNOSIS — E782 Mixed hyperlipidemia: Secondary | ICD-10-CM

## 2021-07-15 DIAGNOSIS — Z79899 Other long term (current) drug therapy: Secondary | ICD-10-CM | POA: Diagnosis not present

## 2021-07-15 DIAGNOSIS — I10 Essential (primary) hypertension: Secondary | ICD-10-CM | POA: Diagnosis not present

## 2021-07-15 DIAGNOSIS — I214 Non-ST elevation (NSTEMI) myocardial infarction: Secondary | ICD-10-CM | POA: Diagnosis not present

## 2021-07-15 NOTE — Patient Instructions (Addendum)
Medication Instructions:  Your physician recommends that you continue on your current medications as directed. Please refer to the Current Medication list given to you today.   Labwork: Your physician recommends that you return for a FASTING lipid/liver panel in 8 weeks around 09/19/2021. Please do not eat or drink for at least 8 hours when you have this done. You may take your medications that morning with a sip of water. This can be done at [facility] (inside your family doctor's office)-no appointment needed  Testing/Procedures: none  Follow-Up: Your physician recommends that you schedule a follow-up appointment in: 6 months  Any Other Special Instructions Will Be Listed Below (If Applicable).  If you need a refill on your cardiac medications before your next appointment, please call your pharmacy.

## 2021-07-18 ENCOUNTER — Telehealth: Payer: Self-pay | Admitting: Internal Medicine

## 2021-07-18 ENCOUNTER — Encounter: Payer: Self-pay | Admitting: Internal Medicine

## 2021-07-18 NOTE — Telephone Encounter (Signed)
Spoke with nurse Sherene Sires who states that while walking on Sat. He felt back pain. She states that pt did not have SOB. O2 sat was 98%. She reports that she did not hear breath sounds in the RLL. Chest X-ray on Sunday showed that the lungs were clear. Ileen will call pt's PCP to notify.

## 2021-07-18 NOTE — Telephone Encounter (Signed)
She is calling in to speak with the nurse in regards to  whats been going on with the patient over the weekend. Stated be had difficultly breathing but he his longs where clear. Please advise!

## 2021-07-21 ENCOUNTER — Other Ambulatory Visit: Payer: Self-pay | Admitting: *Deleted

## 2021-07-21 ENCOUNTER — Encounter: Payer: Self-pay | Admitting: Internal Medicine

## 2021-07-21 ENCOUNTER — Ambulatory Visit (HOSPITAL_COMMUNITY)
Admission: RE | Admit: 2021-07-21 | Discharge: 2021-07-21 | Disposition: A | Discharge status: Home / Self Care | Payer: Medicare Other | Source: Ambulatory Visit | Attending: Internal Medicine | Admitting: Internal Medicine

## 2021-07-21 ENCOUNTER — Other Ambulatory Visit: Payer: Self-pay | Admitting: Internal Medicine

## 2021-07-21 ENCOUNTER — Ambulatory Visit (INDEPENDENT_AMBULATORY_CARE_PROVIDER_SITE_OTHER): Payer: Medicare Other | Admitting: Internal Medicine

## 2021-07-21 ENCOUNTER — Other Ambulatory Visit: Payer: Self-pay

## 2021-07-21 VITALS — BP 122/55 | HR 68 | Ht 69.0 in | Wt 169.0 lb

## 2021-07-21 DIAGNOSIS — M546 Pain in thoracic spine: Secondary | ICD-10-CM | POA: Diagnosis not present

## 2021-07-21 DIAGNOSIS — R053 Chronic cough: Secondary | ICD-10-CM | POA: Diagnosis not present

## 2021-07-21 DIAGNOSIS — R052 Subacute cough: Secondary | ICD-10-CM | POA: Insufficient documentation

## 2021-07-21 DIAGNOSIS — R0602 Shortness of breath: Secondary | ICD-10-CM | POA: Diagnosis not present

## 2021-07-21 DIAGNOSIS — G8929 Other chronic pain: Secondary | ICD-10-CM

## 2021-07-21 MED ORDER — BENZONATATE 100 MG PO CAPS
100.0000 mg | ORAL_CAPSULE | Freq: Two times a day (BID) | ORAL | 0 refills | Status: DC | PRN
Start: 2021-07-21 — End: 2021-09-20

## 2021-07-21 MED ORDER — ALBUTEROL SULFATE HFA 108 (90 BASE) MCG/ACT IN AERS: 2.0000 | INHALATION_SPRAY | Freq: Four times a day (QID) | RESPIRATORY_TRACT | 0 refills | Status: DC | PRN

## 2021-07-21 NOTE — Progress Notes (Signed)
 Acute Office Visit  Subjective:    Patient ID: Billy Hunt, male    DOB: 11/11/1945, 75 y.o.   MRN: [id number]  Chief Complaint  Patient presents with   Follow-up    Low back pain since Saturday. COugh started last evening and was all night. Discomfort on inspiration when he breathes. Did go for chest x-ray this afternoon at 12:30pm    HPI Patient is in today for c/o cough. He states that he has had cough for last 2 weeks, which is intermittent, worse with walking and with lying down. He also reports exertional dyspnea. Of note, he recently had stent placement for NSTEMI. Denies any fever, chills, chest pain, wheezing, nausea or vomiting. His partner has reported that she could not hear breath sounds in RLL. He reports intermittent right sided back discomfort/tenderness.  Past Medical History:  Diagnosis Date   Anxiety    BPH (benign prostatic hyperplasia)    Gout    Hypercholesterolemia    Hyperparathyroidism (HCC)    Hypertension    Pulmonary emboli (HCC)    Vitamin D deficiency    Vitamin D deficiency disease 10/07/2019    Past Surgical History:  Procedure Laterality Date   CORONARY STENT INTERVENTION N/A 07/04/2021   Procedure: CORONARY STENT INTERVENTION;  Surgeon: End, Christopher, MD;  Location: [facility];  Service: Cardiovascular;  Laterality: N/A;   CORONARY STENT INTERVENTION N/A 07/06/2021   Procedure: CORONARY STENT INTERVENTION;  Surgeon: Kelly, Thomas A, MD;  Location: [facility];  Service: Cardiovascular;  Laterality: N/A;   LEFT HEART CATH AND CORONARY ANGIOGRAPHY N/A 07/04/2021   Procedure: LEFT HEART CATH AND CORONARY ANGIOGRAPHY;  Surgeon: End, Christopher, MD;  Location: [facility];  Service: Cardiovascular;  Laterality: N/A;   PERIPHERAL VASCULAR THROMBECTOMY     TRANSURETHRAL RESECTION OF PROSTATE     VASECTOMY      Family History  Problem Relation Age of Onset   Alzheimer's disease Mother        Vascular Dementia    Stroke Mother    Breast cancer Mother    Cancer Father        Multiple Myeloma   Hypertension Father    Deep vein thrombosis Brother    Heart disease Brother    Cancer Paternal Grandmother    Alcohol abuse Paternal Grandfather     Social History   Socioeconomic History   Marital status: Single    Spouse name: Not on file   Number of children: Not on file   Years of education: Not on file   Highest education level: Not on file  Occupational History   Not on file  Tobacco Use   Smoking status: Former    Packs/day: 1.00    Years: 10.00    Pack years: 10.00    Types: Cigarettes    Quit date: 04/21/1971    Years since quitting: 50.2   Smokeless tobacco: Never  Vaping Use   Vaping Use: Never used  Substance and Sexual Activity   Alcohol use: No    Comment: former   Drug use: Yes    Types: Marijuana    Comment: last use: 9/30   Sexual activity: Yes    Birth control/protection: Other-see comments  Other Topics Concern   Not on file  Social History Narrative   Not on file   Social Determinants of Health   Financial Resource Strain: Not on file  Food Insecurity: Not on file  Transportation Needs:   Not on file  Physical Activity: Not on file  Stress: Not on file  Social Connections: Not on file  Intimate Partner Violence: Not on file    Outpatient Medications Prior to Visit  Medication Sig Dispense Refill   aspirin 81 MG EC tablet Take 1 tablet (81 mg total) by mouth daily. Swallow whole. 90 tablet 1   atorvastatin (LIPITOR) 80 MG tablet Take 1 tablet (80 mg total) by mouth daily at 6 PM. 90 tablet 1   carvedilol (COREG) 6.25 MG tablet Take 1 tablet (6.25 mg total) by mouth 2 (two) times daily. 180 tablet 1   cholecalciferol (VITAMIN D3) 25 MCG (1000 UT) tablet Take 2,000 Units by mouth daily.      cinacalcet (SENSIPAR) 30 MG tablet Take 1 tablet (30 mg total) by mouth daily with breakfast. 30 tablet 3   gabapentin (NEURONTIN) 300 MG capsule Take 300 mg by mouth 3  (three) times daily.     losartan (COZAAR) 25 MG tablet Take 1 tablet (25 mg total) by mouth daily. 90 tablet 1   nitroGLYCERIN (NITROSTAT) 0.4 MG SL tablet Place 1 tablet (0.4 mg total) under the tongue every 5 (five) minutes x 3 doses as needed for chest pain. 25 tablet 2   tamsulosin (FLOMAX) 0.4 MG CAPS capsule Take 1 capsule (0.4 mg total) by mouth daily. 90 capsule 1   ticagrelor (BRILINTA) 90 MG TABS tablet Take 1 tablet (90 mg total) by mouth 2 (two) times daily. 180 tablet 2   venlafaxine XR (EFFEXOR-XR) 150 MG 24 hr capsule Take 1 capsule (150 mg total) by mouth daily. 90 capsule 1   No facility-administered medications prior to visit.    Allergies  Allergen Reactions   Sulfa Antibiotics Hives   Sulfamethoxazole Rash    Review of Systems  Constitutional:  Negative for chills and fever.  HENT:  Negative for congestion and sore throat.   Eyes:  Negative for pain and discharge.  Respiratory:  Positive for cough and shortness of breath.   Cardiovascular:  Negative for chest pain and palpitations.  Gastrointestinal:  Negative for constipation, diarrhea, nausea and vomiting.  Endocrine: Negative for polydipsia and polyuria.  Genitourinary:  Negative for dysuria and hematuria.  Musculoskeletal:  Negative for neck pain and neck stiffness.  Skin:  Positive for color change. Negative for rash.  Neurological:  Positive for numbness. Negative for dizziness, weakness and headaches.  Psychiatric/Behavioral:  Negative for agitation and behavioral problems.       Objective:    Physical Exam Vitals reviewed.  Constitutional:      General: He is not in acute distress.    Appearance: He is not diaphoretic.  HENT:     Head: Normocephalic and atraumatic.     Nose: Nose normal.     Mouth/Throat:     Mouth: Mucous membranes are moist.  Eyes:     General: No scleral icterus.    Extraocular Movements: Extraocular movements intact.  Cardiovascular:     Rate and Rhythm: Normal rate and  regular rhythm.     Pulses: Normal pulses.     Heart sounds: Normal heart sounds. No murmur heard. Pulmonary:     Breath sounds: Normal breath sounds. No wheezing or rales.  Musculoskeletal:     Cervical back: Neck supple. No tenderness.     Right lower leg: No edema.     Left lower leg: No edema.  Skin:    General: Skin is warm.     Findings: Bruising (  B/l UE from repeated venepuncture) present. No rash.  Neurological:     General: No focal deficit present.     Mental Status: He is alert and oriented to person, place, and time.  Psychiatric:        Mood and Affect: Mood normal.        Behavior: Behavior normal.    BP (!) 122/55 (BP Location: Left Arm, Patient Position: Sitting, Cuff Size: Normal)   Pulse 68   Ht 5' 9" (1.753 m)   Wt 169 lb (76.7 kg)   SpO2 98%   BMI 24.96 kg/m  Wt Readings from Last 3 Encounters:  07/21/21 169 lb (76.7 kg)  07/15/21 169 lb 6.4 oz (76.8 kg)  07/12/21 172 lb 1.9 oz (78.1 kg)        Assessment & Plan:   Problem List Items Addressed This Visit       Other   Chronic cough - Primary    Intermittent cough, clear breath sounds b/l currently Decreased breath sounds in postop period could be due to atelectasis, advised for deep breathing exercises Tessalon PRN for cough Albuterol PRN for exertional dyspnea Followed by Cardiology for h/o CAD      Relevant Medications   benzonatate (TESSALON) 100 MG capsule   albuterol (VENTOLIN HFA) 108 (90 Base) MCG/ACT inhaler   Other Visit Diagnoses     Chronic right-sided thoracic back pain     Tenderness in the right sided upper back, more likely MSK in etiology Heating pad and/or back brace Tylenol PRN        Meds ordered this encounter  Medications   benzonatate (TESSALON) 100 MG capsule    Sig: Take 1 capsule (100 mg total) by mouth 2 (two) times daily as needed for cough.    Dispense:  20 capsule    Refill:  0   albuterol (VENTOLIN HFA) 108 (90 Base) MCG/ACT inhaler    Sig: Inhale  2 puffs into the lungs every 6 (six) hours as needed for wheezing or shortness of breath.    Dispense:  8 g    Refill:  0     Alaric Gladwin Keith Rake, MD

## 2021-07-21 NOTE — Telephone Encounter (Signed)
Pt has appt 07-21-21 at 2:20 advised him to get chest xray before appt

## 2021-07-21 NOTE — Assessment & Plan Note (Signed)
Intermittent cough, clear breath sounds b/l currently Decreased breath sounds in postop period could be due to atelectasis, advised for deep breathing exercises Tessalon PRN for cough Albuterol PRN for exertional dyspnea Followed by Cardiology for h/o CAD

## 2021-07-21 NOTE — Patient Instructions (Signed)
Please take Tessalon for cough as needed.  Use Albuterol as needed for shortness of breath or wheezing.  Please continue deep breathing exercises and moderate activities as tolerated.

## 2021-07-25 ENCOUNTER — Encounter: Payer: Medicare Other | Admitting: *Deleted

## 2021-07-25 VITALS — BP 108/57 | HR 58 | Resp 16 | Ht 69.0 in | Wt 169.0 lb

## 2021-07-25 DIAGNOSIS — Z006 Encounter for examination for normal comparison and control in clinical research program: Secondary | ICD-10-CM

## 2021-07-25 NOTE — Research (Addendum)
Screening Visit 1  V-Inception Informed Consent   Subject Name: Billy Hunt Subject met inclusion and exclusion criteria.  The informed consent form, study requirements and expectations were reviewed with the subject and questions and concerns were addressed prior to the signing of the consent form.  The subject verbalized understanding of the trial requirements.  The subject agreed to participate in the V-Inception trial and signed the informed consent at [place] on 14/NOV/2022.  The informed consent was obtained prior to performance of any protocol-specific procedures for the subject.  A copy of the signed informed consent was given to the subject and a copy was placed in the subject's medical record.    Jasmine Pang, RN BSN [facility] Cardiovascular Research & Education Direct Line: [phone]   Did this Visit Occur? [x]   Yes   OR     []  NO  Date of Visit Date _14__/_NOV___/_2022____         DD/MON/YYYY  Type of Visit [x] Planned or []  Unplanned [x]  Visit 1 Screening []  Visit 2 Baseline []  Visit 3 []  Visit 4 []  Visit 5/EOS   Protocol Version number under which subject entered study Protocol Version ___02_______  Was Study Informed Consent obtained [x]   Yes   OR     []  NO  Date of Study Informed Consent Date _14__/_NOV___/_2022____           DD/MON/YYYY   Subject Re-Screening (collected Visit 1 only) Is the Subject being re-screened? []   Yes   OR     [x]  NO  Site ID (where subject was previously screened) _______  Subject Number (previously screened) ______    V-Inception Inclusion Criteria Screening Yes [x]    No[]  Males and females ? 73 years of age   Yes [x]  No[]  Recent ACS (inpatient/outpatient) within 5 weeks of screening, defined as: Ischemic symptoms with unstable pattern, occurring at rest or minimal exertion within 24 hours of an unscheduled hospital admission or emergency room visit, due to presumed or proven obstructive coronary disease and at  least one of the following:  Elevated cardiac biomarkers (Cardiac Troponin (cTn) or the MB fraction of Creatinine Kinase (CKMB)) with at least one value above the 99th percentile of the upper reference limit (URL) or defined by the local laboratory MI diagnosis cut-off values OR Resting ECG changes consistent with ischemia or infarction AND additional evidence of obstructive coronary disease.   Yes [x]  No[]  Serum LDL-C ? 40m/dL or non-HDL ? 100 mg/dL   Yes [x]  No[]  Fasting triglycerides <4.52 mmol/L (<400 at screening)   Yes [x]  No[]  Calculated glomerular filtration rate >20 mL/min by estimated glomerular filtration (eGFR)   Yes [x]  No[]  Participants willing to give consent before initiation of any study related procedures and willing to comply with all required study procedures   Yes [x]  No[]  Participants are required to be on statin therapy, or have documented statin intolerance, as determined by the investigator, following hospitalization (inpatient/outpatient) for an ACS. Statin intolerant patients are eligible if they had intolerable side effects on at least 2 different statins, including one at the lowest standard dose.     Exclusion Criteria Screening Yes []  No[x]  Any uncontrolled or serious disease, or any medical or surgical condition, that may either interfere with participation in the clinical study, and/or put the participant at significant risk (according to investigator's [or delegate] judgment) if he/she participates in the clinical study   Yes []  No[x]  An underlying known disease, or surgical, physical, or medical condition that, in the  opinion of the investigator (or delegate) might interfere with interpretation of the clinical study results.   Yes []  No[x]  New York Heart Association (NYHA) class IIIb or IV heart failure or last known left ventricular ejection fraction <25%. Significant cardiac arrhythmia within 3 months prior to randomization that is not controlled by medication  or via ablation at the time of screening.   Yes []  No[x]  Uncontrolled severe hypertension: systolic blood pressure >469 mmHg or diastolic blood pressure >629 mmHg prior to randomization (assessed at screening visit) despite antihypertensive therapy   Yes []  No[x]  Recurrent acute coronary syndrome event within 2 weeks prior to randomization   Yes []  No[x]  Coronary angiography and revascularization procedure (PCI or CABG surgery) performed within 2 weeks prior to the randomization visit or planned after randomization   Yes []  No[x]  Severe concomitant non-cardiovascular disease that carries the risk of reducing life expectancy to less than 2 years   Yes []  No[x]  History of malignancy that required surgery (excluding local and wide-local excision), radiation therapy and/or systemic therapy during the three years prior to randomization.  Yes []  No[x]  Women of child-bearing potential, defined as all women physiologically capable of becoming pregnant, unless they are using basic methods of contraception during dosing of investigational drug   Method of Choice: ______________________________  Total abstinence (when this is in line with the preferred and usual lifestyle of the participant). Periodic abstinence (e.g., calendar, ovulation, symptothermal, post-ovulation methods) and withdrawal are not acceptable methods of contraception  Male sterilization (have had surgical bilateral oophorectomy with or          without hysterectomy), total hysterectomy or tubal ligation at least six weeks before taking investigational drug. In case of oophorectomy alone, only when the reproductive status of the woman has been confirmed by follow up hormone level assessment  Male sterilization (at least 6 months prior to screening). For male participants on the study, the vasectomized male partner should be the sole partner for that subject  Barrier methods of contraception: Condom or Occlusive cap (diaphragm or  cervical/vault caps)  Use of oral (estrogen and progesterone), injected or implanted hormonal methods of contraception or other forms of hormonal contraception that have comparable efficacy (failure rate <1%), for example hormone vaginal ring or transdermal hormone contraception or placement of an intrauterine device (IUD) or intrauterine system (IUS)  In case of use of oral contraception women should have been stable on the same pill for a minimum of 3 months before taking investigational drug. Women are considered post-menopausal and not of child bearing potential if they have had 12 months of natural (spontaneous) amenorrhea with an appropriate clinical profile (e.g., age appropriate, history of vasomotor symptoms) or have had surgical bilateral oophorectomy (with or without hysterectomy), total hysterectomy or tubal ligation at least six weeks ago. In  the case of oophorectomy alone, only when the reproductive status of the woman has been confirmed by follow up hormone level assessment is she considered not of child bearing potential.    Yes []  No[x]  Treatment with other investigational products or devices within 30 days or five half?live of the screening visit, whichever is longer.   Yes []  No[x]  History of hypersensitivity to any of the study treatments or its excipients or to drugs of similar chemical classes.   Yes []  No[x]  Planned use of other investigational products or devices during the course of the study   Yes []  No[x]  Any condition that according to the investigator could interfere with the conduct of the  study,  such as but not limited to: a. Participants who are unable to communicate or to cooperate with the  investigator. b. Unable to understand the protocol requirements, instructions and study-related  restrictions, the nature, scope, and possible consequences of the study  (including participants whose cooperation is doubtful due to drug abuse or  alcohol dependency). c.  Unlikely to comply with the protocol requirements, instructions, and study-related restrictions (e.g., uncooperative attitude, inability to return for follow-up visits, and improbability of completing the study). d. Have any medical or surgical condition, which in the opinion of the  investigator would put the participant at increased risk from participating in the  study. e. Persons directly involved in the conduct of the study   Yes []  No[x]  Treatment with monoclonal antibodies directed towards PCSK9 within 90 days of screening.   Yes []  No[x]  Active liver disease defined as any known current infectious, neoplastic, or metabolic pathology of the liver or (ii) alanine aminotransferase (ALT) elevation >3x ULN, aspartate aminotransferase (AST) elevation >3x ULN, or total bilirubin elevation >2x ULN (except patients with Gilbert's syndrome) at screening confirmed by a repeat measurement at least one week apart.     Subject Age __75_____ Years  Subject Sex [x] Male  OR  []  Male  Subject Childbearing Potential []   Yes   OR     [x]  NO  Ethnicity []  Hispanic or Latino  [x]  Not Hispanic or Latino []  Not Reported []  Unknown  Race [x] White [] Black or African American [] Asian []  American [place] or [place] Native []  Native Hawaiian or Other [place] []  Unknown  Source or Subject Referral [] Advocacy Group [] Clinical Trial Registry [x]  ER Visit or Hospital []  Non-[employer] Internet Sites []  Newsletter/Educational []  Material [] [employer] Internet Site []  6 Own Practice [] Print Advertisement []  Physician Referral []  Radio Advertisement []  Television Advertisement []  Other []  Unknown   Acute Coronary Syndrome (ACS) Medical History  Date of Discharge Hospitalization for Index ACS Event _26_/_OCT___/_2022___ DD/MMM/YYYY  Does Subject have a history of elevated cardiac biomarkers (cardiac troponin cTn) or MB fraction of creatinine kinase (CKMB) with at least one value  above the 99th percentile of the upper reference limit (URL) or defined by the local laboratory MI diagnosis cut off values   [x]   Yes   OR     []  NO  Does the Subject have history of resting ECG changes consistent with ischemia or infarction AND additional evidence of obstructive coronary disease   [x]   Yes   OR     []  NO  Type of Imagining performed []  MRI [] CT Scan [x]  Cardiac Cath []  Other_____________ ____________________   Index ACS Event Classification Note: Any responses that are Yes in this section, do not need to be entered in the medical History other CRF  Did the Subject have a MI [x]   Yes   OR     []  NO []  STEMI   [x]  NSTEMI []  Unstable Angina PCI during Event?  [x]   Yes   OR     []  NO Systolic pressure during Index Dietitian Diastolic Pressure during Index MI __87______ Atrial Fibrillation during Index MI  [] Yes[x]  NO   Unstable Angina []   Yes   OR     [x]  NO Location_______________________________________________________ ________________________________________________________________  ST segment elevation myocardial infarction (STEMI) []   Yes   OR     [x]  NO Location________________________________________________________ ________________________________________________________________  Non-ST-segment elevation myocardial infarction (NSTEMI) [x]   Yes   OR     []  NO  Location____Anterior______________   Revascularization Procedure Associated with ACS Event   Did the subject undergo a revascularization procedure during the Index ACS Event [x]   Yes   OR   []  NO   Type of revascularization procedure  PCI  Date of PCI with Stent Placement procedure _26___/_OCT____/__2022_____ DD/MMM/YYYY  Date of PCI without Stent Placement procedure ____/_____/_______ DD/MMM/YYYY  Date of CABG procedure ____/_____/_______ DD/MMM/YYYY    Previous Medical History   Does the Subject have a history of MI prior to the Index ACS Event []   Yes  OR   [x]  NO   Date of Most recent MI  prior to Index ACS Event  ____/_____/______ DD/MMM/YYYY  Does the subject have a history of stroke []   Yes   OR   [x]  NO   Does the subject have a history of peripheral vascular disease []   Yes  OR   []  NO   Does the subject have a history of hypertension [x]   Yes   OR   []  NO   Does the subject have a history of diabetes []   Yes   OR   [x]  NO    Post ACS Status at Discharge   Left Ventricular Ejection fraction (LVEF) Result___65_________  Blood Pressure _139___/__88___  Renal Function (eGFR) ___60____ml/min/1.73 m2  Lipid-C Lowering Therapy (meds taken at time of index MI  __Atorvastatin 56m_________  Statin Generic Name and dosage __Atorvastatin 80 mg  Ezetimibe dose _____________________________________  History of other CV medications prior to Index ACS Event [x]  Beta-Blocker [x]  Antiplatelet [x]  ACEi or ARB [] Mineralocorticoid antagonists [] Oral Loop Diuretics []  Anticoagulants [] Unknown [] None   Statin Intolerance   Statin Drug Name   Any Medical history of Statin Intolerance []   Yes   OR   [x]  NO    Drug Name:_________________________ Indication:__________________________ Dose:_______________________________ Route:______________________________ Unit:_______________________________ Other Unit Specify_____________________ Frequency:___________________________   Where there any documented symptoms of intolerance related to this statin at this dose []   Yes   OR   []  NO If yes:________________________________ _____________________________________   If other symptoms     Were there any documented abnormal laboratory findings related to this statin at this dose []   Yes   OR   []  NO   Please record all abnormal laboratory findings related to this statin at this dose. Check all that Apply []  []  []  []       Was smoking status assessed [x]   Yes   OR   []  NO   Type of Substance [x]  Tobacco - [x]   Yes   OR   []  NO                     [x] Former                      []  Current                     [] Never [x]  Chewing Tobacco - []   Yes OR [x]  NO                     [] Former                     []  Current                     [] Never [x]  Marijuana - [x]   Yes   OR   []  NO                     []   Former                     [x]  Current                     [] Never [x] Vape - []   Yes   OR   [x]  NO                     [] Former                     []  Current                      [] Never []  Other_______________________________    Vital Signs   Date of Measurement _14___/_NOV____/__2022_____ DD/MMM/YYYY  Height/Hight Unit and Weight/Weight Units ___175.3____________ Cm ____76.7___________ Kilograms  Pulse Rate ______58_________ Beats per Minute  Systolic Blood Pressure ____433_________   Diastolic Blood Pressure _____29__________  Blood Pressure Position __Sitting______________   Was Central Laboratory Assessment performed [x]   Yes   OR   []  NO Date of Collection: _14___/_NOV____/_2022______                               DD/MMM/YYYY Was the Patient Fasting:  [x]   Yes   OR   []  NO  Accession Number [id number]  "Is the subject of childbearing potential? []   Yes   OR   [x]  NO   Serum Pregnancy Test: Was Central laboratory assessment performed?  (Visit 1 and Unscheduled) []   Yes   OR   [x]  NO Reason Not done:_________________________  Hematology: Was Central Laboratory assessment performed? (Visit 1, 2, 5, and unscheduled) [x]   Yes   OR   []  NO Reason Not done:_________________________  Hepatitis: Was Central Laboratory assessment performed? (Visit1 and Unscheduled) [x]   Yes   OR   []  NO Reason Not done:_________________________  Chemistry: Was Central Laboratory Assessment performed? (Visit 1, 2, 3, 4, 5 and Unscheduled) [x]   Yes   OR   []  NO Reason Not done:_________________________  Lipoprotein(s): Was Central Laboratory Assessment performed?  (Visit 1, 2, 3, 4, 5 and Unscheduled) [x]   Yes   OR   []  NO Reason Not done:_________________________   Coagulation: Was Central Laboratory Assessment performed? (Visit 1, 2, and unscheduled) [x]   Yes   OR   []  NO Reason Not done:_________________________  Fasting Glucose: Was Central Laboratory Assessment performed?  (Visit 1, 2, 3, 4, 5 and Unscheduled) [x]   Yes   OR   []  NO Reason Not done:_________________________  BioBank Sample: Was Central Laboratory Assessment performed?  (Visit 2, 3, 4, 5 and Unscheduled) []   Yes   OR   [x]  NO Reason Not done:_________________________   Was Electrocardiogram (ECG) performed? (Visit 1 and Unscheduled) [x]   Yes   OR   []  NO Reason Not done:______________________  Date of Assessment _14___/__NOV___/__2022_____ DD/MMM/YYYY  Time of Assessment ___1020_________ (24 Hour Format)  Any Clinically Significant abnormalities []   Yes   OR   [x]  NO    Were there any Adverse Events Experienced? Yes []    No [x]     Were any Surgical/Medical Procedures reported since signing of Consent Yes []    No[x]    Surgery/Procedure: _____________________  Indication: ___________________________  Start Date: ____/_____/_______                     DD/MMM/YYYY Ongoing: Yes []    No[]   End Date:  ____/_____/_______                    DD/MMM/YYYY   Has the subject been hospitalized since Qualifying Hospitalization Yes []    No[x]    Elective []  Planned []  Unplanned []  Date of Admission: ____/_____/_______                         DD/MMM/YYYY Reason for Admission:__________________ ____________________________________ _____________________________________  Was Subject Discharged: Yes []    No[]  Date of Discharge: ____/_____/_______                                DD/MMM/YYYY Discharge Disposition: _________________    Total number of days in intensive care unit or coronary care unit:________________   Has the subject had any emergency room visits (for less than 24 hours) Yes []    No[x]    Date of Visit: ____/_____/_______                         DD/MMM/YYYY Was Visit  for an AE: Yes []    No[]  Reason for Visit:__________________ ____________________________________ _____________________________________ Was a concomitant or additional treatment given due to this visit: Yes []    No[]    Has the subject had any other clinical or healthcare professional consultations or visits Yes []    No[x]    Professional Type: ____________________  Date of Visit: ____/_____/_______                         DD/MMM/YYYY Was visit an Adverse Event: Yes []    No[]  Reason for Visit: _____________________     Was a concomitant or additional treatment given due to this visit: Yes []    No[]     Outpatient Encounter Medications as of 07/25/2021  Medication Sig   albuterol (VENTOLIN HFA) 108 (90 Base) MCG/ACT inhaler Inhale 2 puffs into the lungs every 6 (six) hours as needed for wheezing or shortness of breath.   aspirin 81 MG EC tablet Take 1 tablet (81 mg total) by mouth daily. Swallow whole.   atorvastatin (LIPITOR) 80 MG tablet Take 1 tablet (80 mg total) by mouth daily at 6 PM.   benzonatate (TESSALON) 100 MG capsule Take 1 capsule (100 mg total) by mouth 2 (two) times daily as needed for cough.   carvedilol (COREG) 6.25 MG tablet Take 1 tablet (6.25 mg total) by mouth 2 (two) times daily.   cholecalciferol (VITAMIN D3) 25 MCG (1000 UT) tablet Take 2,000 Units by mouth daily.    cinacalcet (SENSIPAR) 30 MG tablet Take 1 tablet (30 mg total) by mouth daily with breakfast.   gabapentin (NEURONTIN) 300 MG capsule Take 300 mg by mouth 3 (three) times daily.   losartan (COZAAR) 25 MG tablet Take 1 tablet (25 mg total) by mouth daily.   nitroGLYCERIN (NITROSTAT) 0.4 MG SL tablet Place 1 tablet (0.4 mg total) under the tongue every 5 (five) minutes x 3 doses as needed for chest pain.   tamsulosin (FLOMAX) 0.4 MG CAPS capsule Take 1 capsule (0.4 mg total) by mouth daily.   ticagrelor (BRILINTA) 90 MG TABS tablet Take 1 tablet (90 mg total) by mouth 2 (two) times daily.    venlafaxine XR (EFFEXOR-XR) 150 MG 24 hr capsule Take 1 capsule (150 mg total) by mouth daily.   No facility-administered encounter medications on file as of 07/25/2021.  Form Based on: CFR Completion Guidelines Trial: [id number] Number 2.0 Template Version 4.0, Template Effective Date: 25-Jul-2019

## 2021-07-27 NOTE — Research (Addendum)
V-Inception Screening labs

## 2021-07-29 ENCOUNTER — Encounter: Payer: Self-pay | Admitting: *Deleted

## 2021-07-29 DIAGNOSIS — Z006 Encounter for examination for normal comparison and control in clinical research program: Secondary | ICD-10-CM

## 2021-08-02 ENCOUNTER — Encounter: Payer: Medicare Other | Admitting: *Deleted

## 2021-08-02 ENCOUNTER — Other Ambulatory Visit: Payer: Self-pay

## 2021-08-02 VITALS — BP 110/50 | HR 57 | Resp 16 | Ht 69.0 in | Wt 165.0 lb

## 2021-08-02 DIAGNOSIS — Z006 Encounter for examination for normal comparison and control in clinical research program: Secondary | ICD-10-CM

## 2021-08-02 MED ORDER — STUDY - V-INCEPTION - INCLISIRAN (KJX839) 284 MG/1.5 ML SQ INJECTION (PI-COOPER)
284.0000 mg | PREFILLED_SYRINGE | Freq: Once | SUBCUTANEOUS | Status: AC
  Administered 2021-08-02: 284 mg via SUBCUTANEOUS
  Filled 2021-08-02: qty 1.5

## 2021-08-02 NOTE — Research (Signed)
V-INCEPTION Visit 2  Patient was Randomized at visit 2.  To receive usual care and Inclisiran.  Patient received first dose of Inclisiran with no issues.  Site was clean dry and intact prior to patient leaving the clinical site.  Patient was given the opportunity to ask questions. Visit 3 scheduled for 10/2021.  Patients Urine Dipstick was positive for blood, information discussed with Dr Burt Knack in person, who suggested that the patient follow up with his urologist with whom the patient saw in the past for BPH and TRUP procedure.  Patient was called and given information via phone patient verbalized that he no longer sees the Urologist at [facility] and that he would like to see someone within the [facility] system.  Patient was given resources via phone.  Patient stated that his site looked good at time of phone call  V-INCEPTION Inclusion Criteria Screening Yes [x]   No[] Males and females ? 73 years of age   Yes [x] No[] Recent ACS (inpatient/outpatient) within 5 weeks of screening, defined as: Ischemic symptoms with unstable pattern, occurring at rest or minimal exertion within 24 hours of an unscheduled hospital admission or emergency room visit, due to presumed or proven obstructive coronary disease and at least one of the following:  Elevated cardiac biomarkers (Cardiac Troponin (cTn) or the MB fraction of Creatinine Kinase (CKMB)) with at least one value above the 99th percentile of the upper reference limit (URL) or defined by the local laboratory MI diagnosis cut-off values OR Resting ECG changes consistent with ischemia or infarction AND additional evidence of obstructive coronary disease.   Yes [x] No[] Serum LDL-C ? 3m/dL or non-HDL ? 100 mg/dL   Yes [x] No[] Fasting triglycerides <4.52 mmol/L (<400 at screening)   Yes [x] No[] Calculated glomerular filtration rate >20 mL/min by estimated glomerular filtration (eGFR)   Yes [x] No[] Participants willing to give consent before  initiation of any study related procedures and willing to comply with all required study procedures   Yes [x] No[] Participants are required to be on statin therapy, or have documented statin intolerance, as determined by the investigator, following hospitalization (inpatient/outpatient) for an ACS. Statin intolerant patients are eligible if they had intolerable side effects on at least 2 different statins, including one at the lowest standard dose.     Exclusion Criteria Screening Yes [] No[x] Any uncontrolled or serious disease, or any medical or surgical condition, that may either interfere with participation in the clinical study, and/or put the participant at significant risk (according to investigator's [or delegate] judgment) if he/she participates in the clinical study   Yes [] No[x] An underlying known disease, or surgical, physical, or medical condition that, in the opinion of the investigator (or delegate) might interfere with interpretation of the clinical study results.   Yes [] No[x] New York Heart Association (NYHA) class IIIb or IV heart failure or last known left ventricular ejection fraction <25%. Significant cardiac arrhythmia within 3 months prior to randomization that is not controlled by medication or via ablation at the time of screening.   Yes [] No[x] Uncontrolled severe hypertension: systolic blood pressure >>326mmHg or diastolic blood pressure >>712mmHg prior to randomization (assessed at screening visit) despite antihypertensive therapy   Yes [] No[x] Recurrent acute coronary syndrome event within 2 weeks prior to randomization   Yes [] No[x] Coronary angiography and revascularization procedure (PCI or CABG surgery) performed within 2 weeks prior to the randomization visit or planned after randomization  Yes [] No[x] Severe concomitant non-cardiovascular disease that carries the risk of reducing life expectancy to less than 2 years   Yes [] No[x] History of  malignancy that required surgery (excluding local and wide-local excision), radiation therapy and/or systemic therapy during the three years prior to randomization.  Yes [] No[x] Women of child-bearing potential, defined as all women physiologically capable of becoming pregnant, unless they are using basic methods of contraception during dosing of investigational drug   Method of Choice: ______________________________  Total abstinence (when this is in line with the preferred and usual lifestyle of the participant). Periodic abstinence (e.g., calendar, ovulation, symptothermal, post-ovulation methods) and withdrawal are not acceptable methods of contraception  Male sterilization (have had surgical bilateral oophorectomy with or          without hysterectomy), total hysterectomy or tubal ligation at least six weeks before taking investigational drug. In case of oophorectomy alone, only when the reproductive status of the woman has been confirmed by follow up hormone level assessment  Male sterilization (at least 6 months prior to screening). For male participants on the study, the vasectomized male partner should be the sole partner for that subject  Barrier methods of contraception: Condom or Occlusive cap (diaphragm or cervical/vault caps)  Use of oral (estrogen and progesterone), injected or implanted hormonal methods of contraception or other forms of hormonal contraception that have comparable efficacy (failure rate <1%), for example hormone vaginal ring or transdermal hormone contraception or placement of an intrauterine device (IUD) or intrauterine system (IUS)  In case of use of oral contraception women should have been stable on the same pill for a minimum of 3 months before taking investigational drug. Women are considered post-menopausal and not of child bearing potential if they have had 12 months of natural (spontaneous) amenorrhea with an appropriate clinical profile (e.g., age  appropriate, history of vasomotor symptoms) or have had surgical bilateral oophorectomy (with or without hysterectomy), total hysterectomy or tubal ligation at least six weeks ago. In  the case of oophorectomy alone, only when the reproductive status of the woman has been confirmed by follow up hormone level assessment is she considered not of child bearing potential.    Yes [] No[x] Treatment with other investigational products or devices within 30 days or five half?live of the screening visit, whichever is longer.   Yes [] No[x] History of hypersensitivity to any of the study treatments or its excipients or to drugs of similar chemical classes.   Yes [] No[x] Planned use of other investigational products or devices during the course of the study   Yes [] No[x] Any condition that according to the investigator could interfere with the conduct of the  study, such as but not limited to: a. Participants who are unable to communicate or to cooperate with the  investigator. b. Unable to understand the protocol requirements, instructions and study-related  restrictions, the nature, scope, and possible consequences of the study  (including participants whose cooperation is doubtful due to drug abuse or  alcohol dependency). c. Unlikely to comply with the protocol requirements, instructions, and study-related restrictions (e.g., uncooperative attitude, inability to return for follow-up visits, and improbability of completing the study). d. Have any medical or surgical condition, which in the opinion of the  investigator would put the participant at increased risk from participating in the  study. e. Persons directly involved in the conduct of the study   Yes [] No[x] Treatment with monoclonal antibodies directed towards PCSK9 within 90 days  of screening.   Yes [] No[x] Active liver disease defined as any known current infectious, neoplastic, or metabolic pathology of the liver or (ii) alanine  aminotransferase (ALT) elevation >3x ULN, aspartate aminotransferase (AST) elevation >3x ULN, or total bilirubin elevation >2x ULN (except patients with Gilbert's syndrome) at screening confirmed by a repeat measurement at least one week apart.    Vital Signs   Date of Measurement _22___/_NOV____/_2022______ DD/MMM/YYYY  Height/Hight Unit and Weight/Weight Units _____69__________ inches _____75__________ Kilograms  Pulse Rate ______56_________ Beats per Minute  Systolic Blood Pressure _____097________   Diastolic Blood Pressure _____35__________  Blood Pressure Position ___Sitting_____________   Was smoking status assessed [x]  Yes   OR   [] NO   Type of Substance [x] Tobacco - [x]  Yes   OR   [] NO                     [x]Former                     [] Current                     []Never [x] Chewing Tobacco - []  Yes   OR   [x] NO                     []Former                     [] Current                     []Never [x] Marijuana - [x]  Yes   OR   [] NO                     []Former                     [x] Current                     []Never [x]Vape - []  Yes   OR   [x] NO                     []Former                     [] Current                      []Never [] Other______________________________    Was Central Laboratory Assessment performed [x]  Yes   OR   [] NO Date of Collection: _22___/_NOV____/_2022______                               DD/MMM/YYYY Was the Patient Fasting:  [x]  Yes  OR   [] NO  Accession Number [id number]   "Is the subject of childbearing potential? []  Yes   OR   [x] NO   Serum Pregnancy Test: Was Central laboratory assessment performed?  (Visit 1and Unscheduled) []  Yes   OR   [x] NO Reason Not done:_____male patient_____________  Hematology: Was Central Laboratory assessment performed? (Visit 1, 2, 5, and unscheduled) [x]  Yes   OR   [] NO Reason Not done:_________________________  Hepatitis: Was Central Laboratory assessment performed? (Visit1  and Unscheduled) []  Yes   OR   [  x] NO Reason Not done:____visit 2__________  Chemistry: Was Central Laboratory Assessment performed? (Visit 1, 2, 3, 4, 5 and Unscheduled) [x]  Yes   OR   [] NO Reason Not done:_________________________  Lipoprotein(s): Was Central Laboratory Assessment performed?  (Visit 1, 2, 3, 4, 5 and Unscheduled) [x]  Yes   OR   [] NO Reason Not done:_________________________  Coagulation: Was Central Laboratory Assessment performed? (Visit 1, 2, and unscheduled) [x]  Yes   OR   [] NO Reason Not done:_________________________  Fasting Glucose: Was Central Laboratory Assessment performed?  (Visit 1, 2, 3, 4, 5 and Unscheduled) [x]  Yes   OR   [] NO Reason Not done:_________________________  BioBank Sample: Was Central Laboratory Assessment performed?  (Visit 2, 3, 4, 5 and Unscheduled) [x]  Yes   OR   [] NO Reason Not done:_________________________   Urine Dipstick   Was assessment performed? [x]  Yes   OR   [] NO   Date of Assessment _22___/_NOV____/__2022_____ DD/MMM/YYYY  Result of dipstick urinalysis [] Normal [x] Abnormal, Not Clinically Significant  [] Abnormal, Clinically Significant  Was microscopic examination Performed? (only if dipstick is abnormal) []  Yes   OR   [x] NO ____/_____/_______ DD/MMM/YYYY  Result of microscopic examination ___________________________________________    Randomization   Was Subject Randomized? [x]  Yes   OR   [] NO   Date of Randomization _22___/_NOV____/_2022______ DD/MMM/YYYY   Treatment Group []Usual Care OR [x] Inclusiran + Usual Care   Was Study Medication Administered? [x]  Yes   OR   [] NO  Date of Dose Administered _22___/__NOV___/__2022_____ DD/MMM/YYYY  Dose Form   Dose Administered [x] Inclisiran Sodium 311m/1.5mL  Units [x]Syringe  Route [x] Subcutaneous  Anatomical Location [] Arm [] Thigh [x] Abdomen  Laterality [x] Right [] Left     Were there any Adverse Events Experienced? Yes []   No[x]   If yes, complete the following Description of event: _____________________________________     _____________________________________   Is this AE related to an injection site Reaction Yes []   No[] If yes please provide information below Anatomical Location: ___________________ Laterality: ____________________________ Bruising: Yes []   No[] Erythema: Yes []   No[] Induration: Yes []   No[] Lipodystrophy: Yes []   No[] Necrosis: Yes []   No[] Oedema: Yes []   No[] Pain: Yes []   No[] Pallor/Pigment Change: Yes []   No[] Phlebitis: Yes []   No[] Purtitus:   Yes []   No[] Rash: Yes []   No[] Tenderness:  Yes []   No[] Ulceration: Yes []   No[] Warmth: Yes []   No[] Additional Symptoms: __________________  _____________________________________  Was the Adverse Event serious Yes []   No[]  Serious Criteria   _____________________________________  Start Date ____/_____/_______  DD/MMM/YYYY  End Date ____/_____/_______  DD/MMM/YYYY  Outcome [] Resolved/Recovered [] Not Recovered/Not Resolved [] Fatal  Severity  [] Mild [] Moderate [] Severe  Relationship to Study Treatment    ____________________________________________  Action Treatment with Study Treatment [] No Action Taken []Dose Interrupted [] Drug Withdrawal [] Not Applicable [] Unknown  Was a concomitant or additional treatment given due to this adverse event Yes []   No[] (if yes, add meds/procedures to list)  If AE led to study discontinuation select Yes Yes []   No[]     Were any Surgical/Medical Procedures reported since signing of Consent Yes []   No[x]   Surgery/Procedure: _____________________  Indication: ___________________________  Start Date: ____/_____/_______                     DD/MMM/YYYY Ongoing: Yes []   No[]  End Date: ____/_____/_______                    DD/MMM/YYYY   Has the subject been hospitalized since Qualifying Hospitalization Yes []   No[x]   Elective [] Planned []  Unplanned [] Date of Admission: ____/_____/_______                         DD/MMM/YYYY Reason for Admission:__________________ ____________________________________ _____________________________________  Was Subject Discharged: Yes []   No[] Date of Discharge: ____/_____/_______                                DD/MMM/YYYY Discharge Disposition: _________________    Total number of days in intensive care unit or coronary care unit:________________   Has the subject had any emergency room visits (for less than 24 hours) Yes []   No[x]   Date of Visit: ____/_____/_______                         DD/MMM/YYYY Was Visit for an AE: Yes []   No[] Reason for Visit:__________________ ____________________________________ _____________________________________ Was a concomitant or additional treatment given due to this visit: Yes []   No[]   Has the subject had any other clinical or healthcare professional consultations or visits Yes []   No[x]   Professional Type: ____________________  Date of Visit: ____/_____/_______                         DD/MMM/YYYY Was visit an Adverse Event: Yes []   No[] Reason for Visit: _____________________     Was a concomitant or additional treatment given due to this visit: Yes []   No[]     Outpatient Encounter Medications as of 08/02/2021  Medication Sig   albuterol (VENTOLIN HFA) 108 (90 Base) MCG/ACT inhaler Inhale 2 puffs into the lungs every 6 (six) hours as needed for wheezing or shortness of breath.   aspirin 81 MG EC tablet Take 1 tablet (81 mg total) by mouth daily. Swallow whole.   atorvastatin (LIPITOR) 80 MG tablet Take 1 tablet (80 mg total) by mouth daily at 6 PM.   benzonatate (TESSALON) 100 MG capsule Take 1 capsule (100 mg total) by mouth 2 (two) times daily as needed for cough.   carvedilol (COREG) 6.25 MG tablet Take 1 tablet (6.25 mg total) by mouth 2 (two) times daily.   cholecalciferol (VITAMIN D3) 25 MCG (1000 UT) tablet Take 2,000  Units by mouth daily.    cinacalcet (SENSIPAR) 30 MG tablet Take 1 tablet (30 mg total) by mouth daily with breakfast.   gabapentin (NEURONTIN) 300 MG capsule Take 300 mg by mouth 3 (three) times daily.   losartan (COZAAR) 25 MG tablet Take 1 tablet (25 mg total) by mouth daily.   nitroGLYCERIN (NITROSTAT) 0.4 MG SL tablet Place 1 tablet (0.4 mg total) under the tongue every 5 (five) minutes x 3 doses as needed for chest pain.   tamsulosin (FLOMAX) 0.4 MG CAPS capsule Take 1 capsule (0.4 mg total) by mouth daily.   ticagrelor (BRILINTA) 90 MG TABS tablet Take 1 tablet (90 mg total) by mouth 2 (  two) times daily.   venlafaxine XR (EFFEXOR-XR) 150 MG 24 hr capsule Take 1 capsule (150 mg total) by mouth daily.   [EXPIRED] Study - V-INCEPTION - inclisiran (EHU314) 284 mg/1.5 mL SQ injection ([facility])    No facility-administered encounter medications on file as of 08/02/2021.

## 2021-08-08 ENCOUNTER — Encounter: Payer: Self-pay | Admitting: *Deleted

## 2021-08-08 DIAGNOSIS — Z006 Encounter for examination for normal comparison and control in clinical research program: Secondary | ICD-10-CM

## 2021-08-08 NOTE — Research (Signed)
V-Inception Visit 2 lab results

## 2021-08-08 NOTE — Progress Notes (Signed)
Labs reviewed and not clinically significant. Calcium has been elevated over time and is unchanged from baseline.

## 2021-08-10 NOTE — Research (Signed)
Opened in error

## 2021-08-18 ENCOUNTER — Encounter (INDEPENDENT_AMBULATORY_CARE_PROVIDER_SITE_OTHER): Payer: Medicare Other | Admitting: Nurse Practitioner

## 2021-08-19 ENCOUNTER — Encounter (HOSPITAL_COMMUNITY): Payer: Medicare Other

## 2021-08-22 NOTE — Research (Signed)
Patient seen as part of V-Inception protocol.  Case and protocol discussed with the patient in detail.   Patient present with non STEMI and underwent stenting.  On GDMT.  Inclusion and exclusion criteria met per visit note.    Patient examined Lungs clear.  No JVD Cor regular without significant murmur. Abd non tender Extremity with no edema Killip Class I  Patient will have screening labs and wishes to enroll.  All questions answered.  Bing Quarry, MD, [facility], [place] Director, [facility]

## 2021-09-20 ENCOUNTER — Other Ambulatory Visit: Payer: Self-pay

## 2021-09-20 ENCOUNTER — Ambulatory Visit (INDEPENDENT_AMBULATORY_CARE_PROVIDER_SITE_OTHER): Payer: Medicare Other

## 2021-09-20 DIAGNOSIS — Z0000 Encounter for general adult medical examination without abnormal findings: Secondary | ICD-10-CM | POA: Diagnosis not present

## 2021-09-20 NOTE — Patient Instructions (Addendum)
[name] , Thank you for taking time to come for your Medicare Wellness Visit. I appreciate your ongoing commitment to your health goals. Please review the following plan we discussed and let me know if I can assist you in the future.   These are the goals we discussed:  Goals   None     This is a list of the screening recommended for you and due dates:  Health Maintenance  Topic Date Due   Hepatitis C Screening: USPSTF Recommendation to screen - Ages 22-79 yo.  Never done   Zoster (Shingles) Vaccine (2 of 2) 06/14/2018   COVID-19 Vaccine (5 - Booster for Moderna series) 02/12/2021   Tetanus Vaccine  05/14/2021   Colon Cancer Screening  04/12/2023   Pneumonia Vaccine  Completed   Flu Shot  Completed   HPV Vaccine  Aged Out    Health Maintenance, Male Adopting a healthy lifestyle and getting preventive care are important in promoting health and wellness. Ask your health care provider about: The right schedule for you to have regular tests and exams. Things you can do on your own to prevent diseases and keep yourself healthy. What should I know about diet, weight, and exercise? Eat a healthy diet  Eat a diet that includes plenty of vegetables, fruits, low-fat dairy products, and lean protein. Do not eat a lot of foods that are high in solid fats, added sugars, or sodium. Maintain a healthy weight Body mass index (BMI) is a measurement that can be used to identify possible weight problems. It estimates body fat based on height and weight. Your health care provider can help determine your BMI and help you achieve or maintain a healthy weight. Get regular exercise Get regular exercise. This is one of the most important things you can do for your health. Most adults should: Exercise for at least 150 minutes each week. The exercise should increase your heart rate and make you sweat (moderate-intensity exercise). Do strengthening exercises at least twice a week. This is in addition to  the moderate-intensity exercise. Spend less time sitting. Even light physical activity can be beneficial. Watch cholesterol and blood lipids Have your blood tested for lipids and cholesterol at 74 years of age, then have this test every 5 years. You may need to have your cholesterol levels checked more often if: Your lipid or cholesterol levels are high. You are older than 74 years of age. You are at high risk for heart disease. What should I know about cancer screening? Many types of cancers can be detected early and may often be prevented. Depending on your health history and family history, you may need to have cancer screening at various ages. This may include screening for: Colorectal cancer. Prostate cancer. Skin cancer. Lung cancer. What should I know about heart disease, diabetes, and high blood pressure? Blood pressure and heart disease High blood pressure causes heart disease and increases the risk of stroke. This is more likely to develop in people who have high blood pressure readings or are overweight. Talk with your health care provider about your target blood pressure readings. Have your blood pressure checked: Every 3-5 years if you are 37-68 years of age. Every year if you are 79 years old or older. If you are between the ages of 102 and 58 and are a current or former smoker, ask your health care provider if you should have a one-time screening for abdominal aortic aneurysm (AAA). Diabetes Have regular diabetes screenings. This checks  your fasting blood sugar level. Have the screening done: Once every three years after age 11 if you are at a normal weight and have a low risk for diabetes. More often and at a younger age if you are overweight or have a high risk for diabetes. What should I know about preventing infection? Hepatitis B If you have a higher risk for hepatitis B, you should be screened for this virus. Talk with your health care provider to find out if you are  at risk for hepatitis B infection. Hepatitis C Blood testing is recommended for: Everyone born from [birth year] through 1963. Anyone with known risk factors for hepatitis C. Sexually transmitted infections (STIs) You should be screened each year for STIs, including gonorrhea and chlamydia, if: You are sexually active and are younger than 74 years of age. You are older than 74 years of age and your health care provider tells you that you are at risk for this type of infection. Your sexual activity has changed since you were last screened, and you are at increased risk for chlamydia or gonorrhea. Ask your health care provider if you are at risk. Ask your health care provider about whether you are at high risk for HIV. Your health care provider may recommend a prescription medicine to help prevent HIV infection. If you choose to take medicine to prevent HIV, you should first get tested for HIV. You should then be tested every 3 months for as long as you are taking the medicine. Follow these instructions at home: Alcohol use Do not drink alcohol if your health care provider tells you not to drink. If you drink alcohol: Limit how much you have to 0-2 drinks a day. Know how much alcohol is in your drink. In the [place] one drink equals one 12 oz bottle of beer (355 mL), one 5 oz glass of wine (148 mL), or one 1 oz glass of hard liquor (44 mL). Lifestyle Do not use any products that contain nicotine or tobacco. These products include cigarettes, chewing tobacco, and vaping devices, such as e-cigarettes. If you need help quitting, ask your health care provider. Do not use street drugs. Do not share needles. Ask your health care provider for help if you need support or information about quitting drugs. General instructions Schedule regular health, dental, and eye exams. Stay current with your vaccines. Tell your health care provider if: You often feel depressed. You have ever been abused or do not feel  safe at home. Summary Adopting a healthy lifestyle and getting preventive care are important in promoting health and wellness. Follow your health care provider's instructions about healthy diet, exercising, and getting tested or screened for diseases. Follow your health care provider's instructions on monitoring your cholesterol and blood pressure. This information is not intended to replace advice given to you by your health care provider. Make sure you discuss any questions you have with your health care provider. Document Revised: 01/17/2021 Document Reviewed: 01/17/2021 Elsevier Patient Education  2022 [employer].

## 2021-09-20 NOTE — Progress Notes (Addendum)
Subjective:   Billy Hunt is a 76 y.o. male who presents for Medicare Annual/Subsequent preventive examination. I connected with  Saunders Glance on 09/20/21 by a audio enabled telemedicine application and verified that I am speaking with the correct person using two identifiers.  Patient Location: Home  Provider Location: Office/Clinic  I discussed the limitations of evaluation and management by telemedicine. The patient expressed understanding and agreed to proceed.  Review of Systems   Defer to PCP Cardiac Risk Factors include: advanced age (>82mn, >>59women);male gender;hypertension     Objective:    There were no vitals filed for this visit. There is no height or weight on file to calculate BMI.  Advanced Directives 09/20/2021 07/05/2021 08/16/2020 08/04/2019 06/12/2015 04/21/2015  Does Patient Have a Medical Advance Directive? No;Yes Yes Yes Yes Yes Yes  Type of Advance Directive Living will [place] Living will;Healthcare Power of Attorney Out of facility DNR (pink MOST or yellow form) - [place]  Does patient want to make changes to medical advance directive? - No - Patient declined No - Patient declined No - Patient declined - -  Copy of [place] Chart? - No - copy requested No - copy requested - No - copy requested -  Would patient like information on creating a medical advance directive? No - Patient declined - - - - -    Current Medications (verified) Outpatient Encounter Medications as of 09/20/2021  Medication Sig   albuterol (VENTOLIN HFA) 108 (90 Base) MCG/ACT inhaler Inhale 2 puffs into the lungs every 6 (six) hours as needed for wheezing or shortness of breath.   aspirin 81 MG EC tablet Take 1 tablet (81 mg total) by mouth daily. Swallow whole.   atorvastatin (LIPITOR) 80 MG tablet Take 1 tablet (80 mg total) by mouth daily at 6 PM.   carvedilol (COREG) 6.25 MG tablet Take 1  tablet (6.25 mg total) by mouth 2 (two) times daily.   cholecalciferol (VITAMIN D3) 25 MCG (1000 UT) tablet Take 2,000 Units by mouth daily.    cinacalcet (SENSIPAR) 30 MG tablet Take 1 tablet (30 mg total) by mouth daily with breakfast.   gabapentin (NEURONTIN) 300 MG capsule Take 300 mg by mouth 3 (three) times daily.   losartan (COZAAR) 25 MG tablet Take 1 tablet (25 mg total) by mouth daily.   nitroGLYCERIN (NITROSTAT) 0.4 MG SL tablet Place 1 tablet (0.4 mg total) under the tongue every 5 (five) minutes x 3 doses as needed for chest pain.   tamsulosin (FLOMAX) 0.4 MG CAPS capsule Take 1 capsule (0.4 mg total) by mouth daily.   ticagrelor (BRILINTA) 90 MG TABS tablet Take 1 tablet (90 mg total) by mouth 2 (two) times daily.   venlafaxine XR (EFFEXOR-XR) 150 MG 24 hr capsule Take 1 capsule (150 mg total) by mouth daily.   [DISCONTINUED] benzonatate (TESSALON) 100 MG capsule Take 1 capsule (100 mg total) by mouth 2 (two) times daily as needed for cough.   No facility-administered encounter medications on file as of 09/20/2021.    Allergies (verified) Sulfa antibiotics and Sulfamethoxazole   History: Past Medical History:  Diagnosis Date   Anxiety    BPH (benign prostatic hyperplasia)    Gout    Hypercholesterolemia    Hyperparathyroidism ([place]    Hypertension    Pulmonary emboli (HCC)    Vitamin D deficiency    Vitamin D deficiency disease 10/07/2019   Past  Surgical History:  Procedure Laterality Date   CORONARY STENT INTERVENTION N/A 07/04/2021   Procedure: CORONARY STENT INTERVENTION;  Surgeon: Nelva Bush, MD;  Location: [place] [facility];  Service: Cardiovascular;  Laterality: N/A;   CORONARY STENT INTERVENTION N/A 07/06/2021   Procedure: CORONARY STENT INTERVENTION;  Surgeon: Troy Sine, MD;  Location: [place] [facility];  Service: Cardiovascular;  Laterality: N/A;   LEFT HEART CATH AND CORONARY ANGIOGRAPHY N/A 07/04/2021   Procedure: LEFT HEART CATH AND CORONARY  ANGIOGRAPHY;  Surgeon: Nelva Bush, MD;  Location: [place];  Service: Cardiovascular;  Laterality: N/A;   PERIPHERAL VASCULAR THROMBECTOMY     TRANSURETHRAL RESECTION OF PROSTATE     VASECTOMY     Family History  Problem Relation Age of Onset   Alzheimer's disease Mother        Vascular Dementia   Stroke Mother    Breast cancer Mother    Cancer Father        Multiple Myeloma   Hypertension Father    Deep vein thrombosis Brother    Heart disease Brother    Cancer Paternal Grandmother    Alcohol abuse Paternal Grandfather    Social History   Socioeconomic History   Marital status: Divorced    Spouse name: Not on file   Number of children: 2   Years of education: 12   Highest education level: Master's degree (e.g., MA, MS, MEng, MEd, MSW, MBA)  Occupational History   Not on file  Tobacco Use   Smoking status: Former    Packs/day: 1.00    Years: 10.00    Pack years: 10.00    Types: Cigarettes    Quit date: 04/21/1971    Years since quitting: 50.4   Smokeless tobacco: Never  Vaping Use   Vaping Use: Never used  Substance and Sexual Activity   Alcohol use: No    Comment: former   Drug use: Yes    Types: Marijuana    Comment: last use: 9/30   Sexual activity: Yes    Birth control/protection: Other-see comments  Other Topics Concern   Not on file  Social History Narrative   Not on file   Social Determinants of Health   Financial Resource Strain: Low Risk    Difficulty of Paying Living Expenses: Not hard at all  Food Insecurity: No Food Insecurity   Worried About Charity fundraiser in the Last Year: Never true   Ran Out of Food in the Last Year: Never true  Transportation Needs: No Transportation Needs   Lack of Transportation (Medical): No   Lack of Transportation (Non-Medical): No  Physical Activity: Sufficiently Active   Days of Exercise per Week: 5 days   Minutes of Exercise per Session: 40 min  Stress: No Stress Concern Present   Feeling  of Stress : Not at all  Social Connections: Socially Isolated   Frequency of Communication with Friends and Family: Three times a week   Frequency of Social Gatherings with Friends and Family: Once a week   Attends Religious Services: Never   Marine scientist or Organizations: No   Attends Music therapist: Never   Marital Status: Divorced    Tobacco Counseling Counseling given: Not Answered   Clinical Intake:  Pre-visit preparation completed: No  Pain : No/denies pain     Nutritional Risks: None Diabetes: No  What is the last grade level you completed in school?: 12th  Diabetic?no  Interpreter Needed?:  No  Information entered by :: [place] of Daily Living In your present state of health, do you have any difficulty performing the following activities: 09/20/2021 07/05/2021  Hearing? N Y  Vision? N N  Difficulty concentrating or making decisions? N Y  Walking or climbing stairs? N N  Dressing or bathing? N Y  Doing errands, shopping? N N  Preparing Food and eating ? N -  Using the Toilet? N -  In the past six months, have you accidently leaked urine? N -  Do you have problems with loss of bowel control? N -  Managing your Medications? N -  Managing your Finances? N -  Housekeeping or managing your Housekeeping? N -  Some recent data might be hidden    Patient Care Team: Lindell Spar, MD as PCP - General (Internal Medicine) Fay Records, MD as PCP - Cardiology (Cardiology)  Indicate any recent Medical Services you may have received from other than [facility] providers in the past year (date may be approximate).     Assessment:   This is a routine wellness examination for Gabriella.  Hearing/Vision screen No results found.  Dietary issues and exercise activities discussed: Current Exercise Habits: Home exercise routine, Type of exercise: walking, Time (Minutes): 40, Frequency (Times/Week): 5, Weekly Exercise (Minutes/Week): 200,  Intensity: Moderate, Exercise limited by: None identified   Goals Addressed   None   Depression Screen PHQ 2/9 Scores 09/20/2021 07/21/2021 07/12/2021 06/01/2021 11/15/2020 08/16/2020 08/04/2019  PHQ - 2 Score 0 0 0 0 0 0 0  PHQ- 9 Score - - - - 0 0 -    Fall Risk Fall Risk  07/21/2021 07/12/2021 06/01/2021 11/15/2020 08/16/2020  Falls in the past year? 0 0 0 0 0  Number falls in past yr: 0 0 0 - -  Injury with Fall? 0 0 0 - -  Risk for fall due to : No Fall Risks No Fall Risks No Fall Risks - -  Follow up Falls evaluation completed Falls evaluation completed - - -    FALL RISK PREVENTION PERTAINING TO THE HOME:  Any stairs in or around the home? Yes  If so, are there any without handrails? Yes  Home free of loose throw rugs in walkways, pet beds, electrical cords, etc? No  Adequate lighting in your home to reduce risk of falls? Yes   ASSISTIVE DEVICES UTILIZED TO PREVENT FALLS:  Life alert? No  Use of a cane, walker or w/c? No  Grab bars in the bathroom? Yes  Shower chair or bench in shower? No  Elevated toilet seat or a handicapped toilet? No    Cognitive Function:     6CIT Screen 09/20/2021 08/16/2020 08/04/2019  What Year? 0 points 0 points 0 points  What month? 0 points 0 points 0 points  What time? 0 points 0 points 0 points  Count back from 20 0 points 0 points 0 points  Months in reverse 0 points 0 points 0 points  Repeat phrase 0 points 4 points 2 points  Total Score 0 4 2    Immunizations Immunization History  Administered Date(s) Administered   Fluad Quad(high Dose 65+) 08/04/2019, 08/17/2020, 06/01/2021   Moderna Sars-Covid-2 Vaccination 10/23/2019, 11/21/2019, 07/05/2020   Pneumococcal Conjugate-13 08/16/2015   Pneumococcal Polysaccharide-23 07/16/2017   Tdap 05/15/2011   Unspecified SARS-COV-2 Vaccination 12/18/2020   Zoster Recombinat (Shingrix) 04/19/2018    TDAP status: Due, Education has been provided regarding the importance of this vaccine. Advised  may receive this vaccine at local pharmacy or Health Dept. Aware to provide a copy of the vaccination record if obtained from local pharmacy or Health Dept. Verbalized acceptance and understanding.  Flu Vaccine status: Up to date  Pneumococcal vaccine status: Up to date  Covid-19 vaccine status: Information provided on how to obtain vaccines.   Qualifies for Shingles Vaccine? Yes   Zostavax completed No   Shingrix Completed?: No.    Education has been provided regarding the importance of this vaccine. Patient has been advised to call insurance company to determine out of pocket expense if they have not yet received this vaccine. Advised may also receive vaccine at local pharmacy or Health Dept. Verbalized acceptance and understanding.  Screening Tests Health Maintenance  Topic Date Due   Hepatitis C Screening  Never done   Zoster Vaccines- Shingrix (2 of 2) 06/14/2018   COVID-19 Vaccine (5 - Booster for Moderna series) 02/12/2021   TETANUS/TDAP  05/14/2021   COLONOSCOPY (Pts 45-78yr Insurance coverage will need to be confirmed)  04/12/2023   Pneumonia Vaccine 76 Years old  Completed   INFLUENZA VACCINE  Completed   HPV VACCINES  Aged Out    Health Maintenance  Health Maintenance Due  Topic Date Due   Hepatitis C Screening  Never done   Zoster Vaccines- Shingrix (2 of 2) 06/14/2018   COVID-19 Vaccine (5 - Booster for Moderna series) 02/12/2021   TETANUS/TDAP  05/14/2021    Colorectal cancer screening: Type of screening: Colonoscopy. Completed 04/11/2013. Repeat every 10 years  Lung Cancer Screening: (Low Dose CT Chest recommended if Age 74-80years, 30 pack-year currently smoking OR have quit w/in 15years.) does not qualify.   Lung Cancer Screening Referral: n/a  Additional Screening:  Hepatitis C Screening: does not qualify; Completed not high risk for HEP C  Vision Screening: Recommended annual ophthalmology exams for early detection of glaucoma and other disorders of  the eye. Is the patient up to date with their annual eye exam?  No  Who is the provider or what is the name of the office in which the patient attends annual eye exams? Pt refuses  If pt is not established with a provider, would they like to be referred to a provider to establish care? No .   Dental Screening: Recommended annual dental exams for proper oral hygiene  Community Resource Referral / Chronic Care Management: CRR required this visit?  No   CCM required this visit?  No      Plan:     I have personally reviewed and noted the following in the patients chart:   Medical and social history Use of alcohol, tobacco or illicit drugs  Current medications and supplements including opioid prescriptions. Patient is not currently taking opioid prescriptions. Functional ability and status Nutritional status Physical activity Advanced directives List of other physicians Hospitalizations, surgeries, and ER visits in previous 12 months Vitals Screenings to include cognitive, depression, and falls Referrals and appointments  In addition, I have reviewed and discussed with patient certain preventive protocols, quality metrics, and best practice recommendations. A written personalized care plan for preventive services as well as general preventive health recommendations were provided to patient.     SEarline [place]  09/20/2021   Nurse Notes:  Mr. KGoeller, Thank you for taking time to come for your Medicare Wellness Visit. I appreciate your ongoing commitment to your health goals. Please review the following plan we discussed and let me know if I can  assist you in the future.   These are the goals we discussed:  Goals   None     This is a list of the screening recommended for you and due dates:  Health Maintenance  Topic Date Due   Hepatitis C Screening: USPSTF Recommendation to screen - Ages 101-79 yo.  Never done   Zoster (Shingles) Vaccine (2 of 2) 06/14/2018    COVID-19 Vaccine (5 - Booster for Moderna series) 02/12/2021   Tetanus Vaccine  05/14/2021   Colon Cancer Screening  04/12/2023   Pneumonia Vaccine  Completed   Flu Shot  Completed   HPV Vaccine  Aged Out

## 2021-09-28 ENCOUNTER — Other Ambulatory Visit: Payer: Self-pay | Admitting: Cardiology

## 2021-09-28 DIAGNOSIS — E210 Primary hyperparathyroidism: Secondary | ICD-10-CM | POA: Diagnosis not present

## 2021-09-28 DIAGNOSIS — E782 Mixed hyperlipidemia: Secondary | ICD-10-CM | POA: Diagnosis not present

## 2021-09-28 DIAGNOSIS — Z79899 Other long term (current) drug therapy: Secondary | ICD-10-CM | POA: Diagnosis not present

## 2021-09-29 LAB — PTH, INTACT AND CALCIUM
Calcium: 10.3 mg/dL — ABNORMAL HIGH (ref 8.6–10.2)
PTH: 47 pg/mL (ref 15–65)

## 2021-09-29 LAB — LIPID PANEL
Chol/HDL Ratio: 2 ratio (ref 0.0–5.0)
Cholesterol, Total: 97 mg/dL — ABNORMAL LOW (ref 100–199)
HDL: 49 mg/dL (ref >39)
LDL Chol Calc (NIH): 35 mg/dL (ref 0–99)
Triglycerides: 54 mg/dL (ref 0–149)
VLDL Cholesterol Cal: 13 mg/dL (ref 5–40)

## 2021-09-29 LAB — HEPATIC FUNCTION PANEL
ALT: 165 IU/L — ABNORMAL HIGH (ref 0–44)
AST: 124 IU/L — ABNORMAL HIGH (ref 0–40)
Albumin: 4.4 g/dL (ref 3.7–4.7)
Alkaline Phosphatase: 110 IU/L (ref 44–121)
Bilirubin Total: 0.6 mg/dL (ref 0.0–1.2)
Bilirubin, Direct: 0.2 mg/dL (ref 0.00–0.40)
Total Protein: 6.7 g/dL (ref 6.0–8.5)

## 2021-09-29 LAB — PHOSPHORUS: Phosphorus: 3.1 mg/dL (ref 2.8–4.1)

## 2021-09-29 LAB — VITAMIN D 25 HYDROXY (VIT D DEFICIENCY, FRACTURES): Vit D, 25-Hydroxy: 75.5 ng/mL (ref 30.0–100.0)

## 2021-09-29 LAB — MAGNESIUM: Magnesium: 2.2 mg/dL (ref 1.6–2.3)

## 2021-09-30 ENCOUNTER — Encounter: Payer: Self-pay | Admitting: *Deleted

## 2021-09-30 ENCOUNTER — Telehealth: Payer: Self-pay | Admitting: Internal Medicine

## 2021-09-30 DIAGNOSIS — Z006 Encounter for examination for normal comparison and control in clinical research program: Secondary | ICD-10-CM

## 2021-09-30 NOTE — Telephone Encounter (Signed)
Billy Hunt is requesting to discuss patient's lab results.

## 2021-09-30 NOTE — Telephone Encounter (Signed)
Office contacted by patient and wife due to elevated liver enzymes. Advised that lab results not reviewed by provider yet. Labs originally ordered by Rennis Harding, NP but will send to primary cardiologist as well as research team staff for review and recommendations.

## 2021-09-30 NOTE — Research (Signed)
[facility] Research   Site made aware of elevated Liver Enzymes.  Dr Excell Seltzer notified of results.  Will continue to follow at this time     Evern Bio, RN BSN [facility] Cardiovascular Research & Education Direct Line: [phone]

## 2021-10-03 ENCOUNTER — Encounter: Payer: Self-pay | Admitting: *Deleted

## 2021-10-03 DIAGNOSIS — Z006 Encounter for examination for normal comparison and control in clinical research program: Secondary | ICD-10-CM

## 2021-10-03 NOTE — Research (Signed)
Late Entry  Spoke to patients wife on 09/30/2021 after 5 pm.  Patient has been instructed to hold his atorvastatin for 4 weeks and then recheck his labs.  Research will take care of rechecking his lab work in 4 weeks.    Evern Bio, RN BSN [place] [facility] Cardiovascular Research & Education Direct Line: [phone]

## 2021-10-10 ENCOUNTER — Encounter: Payer: Self-pay | Admitting: "Endocrinology

## 2021-10-10 ENCOUNTER — Other Ambulatory Visit: Payer: Self-pay

## 2021-10-10 ENCOUNTER — Ambulatory Visit (INDEPENDENT_AMBULATORY_CARE_PROVIDER_SITE_OTHER): Payer: Medicare Other | Admitting: "Endocrinology

## 2021-10-10 VITALS — BP 106/56 | HR 52 | Ht 69.0 in | Wt 171.2 lb

## 2021-10-10 DIAGNOSIS — E210 Primary hyperparathyroidism: Secondary | ICD-10-CM | POA: Diagnosis not present

## 2021-10-10 DIAGNOSIS — M8580 Other specified disorders of bone density and structure, unspecified site: Secondary | ICD-10-CM | POA: Diagnosis not present

## 2021-10-10 NOTE — Progress Notes (Signed)
10/10/2021, 5:18 PM  Endocrinology follow-up note   Billy Hunt is a 76 y.o.-year-old male, who is returning for follow-up after he was seen in consultation for hypercalcemia/hyperparathyroidism, osteopenia. PMD:   Lindell Spar, MD   Past Medical History:  Diagnosis Date   Anxiety    BPH (benign prostatic hyperplasia)    Gout    Hypercholesterolemia    Hyperparathyroidism ([place])    Hypertension    Pulmonary emboli ([place])    Vitamin D deficiency    Vitamin D deficiency disease 10/07/2019    Past Surgical History:  Procedure Laterality Date   CORONARY STENT INTERVENTION N/A 07/04/2021   Procedure: CORONARY STENT INTERVENTION;  Surgeon: Nelva Bush, MD;  Location: [facility];  Service: Cardiovascular;  Laterality: N/A;   CORONARY STENT INTERVENTION N/A 07/06/2021   Procedure: CORONARY STENT INTERVENTION;  Surgeon: Troy Sine, MD;  Location: [facility];  Service: Cardiovascular;  Laterality: N/A;   LEFT HEART CATH AND CORONARY ANGIOGRAPHY N/A 07/04/2021   Procedure: LEFT HEART CATH AND CORONARY ANGIOGRAPHY;  Surgeon: Nelva Bush, MD;  Location: [facility];  Service: Cardiovascular;  Laterality: N/A;   PERIPHERAL VASCULAR THROMBECTOMY     TRANSURETHRAL RESECTION OF PROSTATE     VASECTOMY      Social History   Tobacco Use   Smoking status: Former    Packs/day: 1.00    Years: 10.00    Pack years: 10.00    Types: Cigarettes    Quit date: 04/21/1971    Years since quitting: 50.5   Smokeless tobacco: Never  Vaping Use   Vaping Use: Never used  Substance Use Topics   Alcohol use: No    Comment: former   Drug use: Yes    Types: Marijuana    Comment: last use: 9/30    Family History  Problem Relation Age of Onset   Alzheimer's disease Mother        Vascular Dementia   Stroke Mother    Breast cancer Mother    Cancer Father        Multiple Myeloma   Hypertension Father    Deep vein thrombosis Brother    Heart disease  Brother    Cancer Paternal Grandmother    Alcohol abuse Paternal Grandfather     Outpatient Encounter Medications as of 10/10/2021  Medication Sig   albuterol (VENTOLIN HFA) 108 (90 Base) MCG/ACT inhaler Inhale 2 puffs into the lungs every 6 (six) hours as needed for wheezing or shortness of breath.   aspirin 81 MG EC tablet Take 1 tablet (81 mg total) by mouth daily. Swallow whole.   carvedilol (COREG) 6.25 MG tablet Take 1 tablet (6.25 mg total) by mouth 2 (two) times daily.   cholecalciferol (VITAMIN D3) 25 MCG (1000 UT) tablet Take 2,000 Units by mouth daily.    cinacalcet (SENSIPAR) 30 MG tablet Take 1 tablet (30 mg total) by mouth daily with breakfast.   gabapentin (NEURONTIN) 300 MG capsule Take 300 mg by mouth daily.   losartan (COZAAR) 25 MG tablet Take 1 tablet (25 mg total) by mouth daily.   nitroGLYCERIN (NITROSTAT) 0.4 MG SL tablet Place 1 tablet (0.4 mg total) under the tongue every 5 (five) minutes x 3 doses as needed for chest pain.   tamsulosin (FLOMAX) 0.4 MG CAPS capsule Take 1 capsule (0.4 mg total) by mouth daily.   ticagrelor (BRILINTA) 90 MG TABS tablet Take 1 tablet (90 mg  total) by mouth 2 (two) times daily.   venlafaxine XR (EFFEXOR-XR) 150 MG 24 hr capsule Take 1 capsule (150 mg total) by mouth daily.   [DISCONTINUED] atorvastatin (LIPITOR) 80 MG tablet Take 1 tablet (80 mg total) by mouth daily at 6 PM.   No facility-administered encounter medications on file as of 10/10/2021.    Allergies  Allergen Reactions   Sulfa Antibiotics Hives   Sulfamethoxazole Rash     HPI  Billy Hunt reports that he was diagnosed with hypercalcemia approximately 6 years ago with blood test showing high PTH and high calcium.  He denies any history of nephrolithiasis, seizure disorder, cardiac dysrhythmias.  Denies any history of CKD.   -He however was found to have osteopenia in 2014.  His repeat bone density in [facility] on November 25, 2018 showed left femur  osteopenia with a T score of -1.6, left forearm radius 33% T score of -0.2, spine excluded due to degenerative changes.   His most recent bone density on November 26, 2020 shows similar findings still consistent with osteopenia.  -He is currently not on calcium supplements, however on vitamin D3 2000 units daily.  His previsit labs show vitamin D replete at 75.5.  -He denies any family history of parathyroid, thyroid, pituitary, adrenal dysfunction.   -He returns with stable calcium at 10.3 and improved PTH is 47.  PTH overall has improved from 130 and calcium overall improved from 11.4.  - prior to his last visit, 24-hour urine calcium on November 22, 2018 was 221. He has no new complaints today.  No prior history of fragility fractures or falls.    He is a former  smoker.   he is not on HCTZ or other thiazide therapy.       BP (!) 106/56    Pulse (!) 52    Ht 5' 9"  (1.753 m)    Wt 171 lb 3.2 oz (77.7 kg)    BMI 25.28 kg/m , Body mass index is 25.28 kg/m.   CMP     Component Value Date/Time   NA 137 07/05/2021 0320   NA 138 11/29/2020 0929   K 3.5 07/05/2021 0320   CL 104 07/05/2021 0320   CO2 24 07/05/2021 0320   GLUCOSE 137 (H) 07/05/2021 0320   BUN 13 07/05/2021 0320   BUN 18 11/29/2020 0929   CREATININE 0.89 07/05/2021 0320   CALCIUM 10.3 (H) 09/28/2021 0843   PROT 6.7 09/28/2021 0844   ALBUMIN 4.4 09/28/2021 0844   AST 124 (H) 09/28/2021 0844   ALT 165 (H) 09/28/2021 0844   ALKPHOS 110 09/28/2021 0844   BILITOT 0.6 09/28/2021 0844   GFRNONAA >60 07/05/2021 0320   GFRAA >60 07/27/2017 0100   Most recent official lab reports from September 17, 2018: 25-hydroxy vitamin D 43, PTH 76 elevated, calcium 11.4 mg per DL elevated, BUN 25, creatinine 0.98. -He came with separate handwritten summaries of his labs from 2014 showing PTH elevated between 89-1 67 between May and June 2014, 25 hydroxy vitamin D ranging between 8.6-27 between June and August 2014, phosphorus range from  2.2-2.3 between May and August 2014, 24-hour urine calcium 192 mg in 24 hours in June 2014. DEXA scan showed mild osteopenia in June 2014.   Surveillance DEXA on November 25, 2018  DualFemur Neck Left 11/25/2018 70.4 N/A -1.6 0.868 g/cm2   Left Forearm Radius 33% 11/25/2018 70.4 N/A -0.2 0.787 g/cm2   ASSESSMENT: BMD as determined from Femur Neck Left  is 0.868 g/cm2 with a T-Score of -1.6.   This patient is considered OSTEOPENIC according to the [facility]) criteria.    Repeat bone density on November 26, 2020: DualFemur Neck Left 11/26/2020 72.4 N/A -1.6 0.857 g/cm2 -1.3% - DualFemur Neck Left 11/25/2018 70.4 N/A -1.6 0.868 g/cm2 - -   DualFemur Total Mean 11/26/2020 72.4 N/A -1.3 0.914 g/cm2 -6.3% Yes DualFemur Total Mean 11/25/2018 70.4 N/A -0.9 0.975 g/cm2 - -   Left Forearm Radius 33% 11/26/2020 72.4 N/A -0.5 0.768 g/cm2 -2.3% - Left Forearm Radius 33% 11/25/2018 70.4 N/A -0.2 0.787 g/cm2 - -   ASSESSMENT: BMD as determined from Femur Neck Left is 0.857 g/cm2 with aT-score of -1.6.   This patient is considered OSTEOPENIC according to [facility]) criteria.  Recent Results (from the past 2160 hour(s))  PTH, intact and calcium     Status: Abnormal   Collection Time: 09/28/21  8:43 AM  Result Value Ref Range   Calcium 10.3 (H) 8.6 - 10.2 mg/dL   PTH 47 15 - 65 pg/mL   PTH Interp Comment     Comment: Interpretation                 Intact PTH    Calcium                                 (pg/mL)      (mg/dL) Normal                          15 - 65     8.6 - 10.2 Primary Hyperparathyroidism         >65          >10.2 Secondary Hyperparathyroidism       >65          <10.2 Non-Parathyroid Hypercalcemia       <65          >10.2 Hypoparathyroidism                  <15          < 8.6 Non-Parathyroid Hypocalcemia    15 - 65          < 8.6   VITAMIN D 25 Hydroxy (Vit-D Deficiency, Fractures)     Status: None   Collection Time: 09/28/21  8:43  AM  Result Value Ref Range   Vit D, 25-Hydroxy 75.5 30.0 - 100.0 ng/mL    Comment: Vitamin D deficiency has been defined by the [place] and an Endocrine Society practice guideline as a level of serum 25-OH vitamin D less than 20 ng/mL (1,2). The Endocrine Society went on to further define vitamin D insufficiency as a level between 21 and 29 ng/mL (2). 1. IOM (Institute of Medicine). 2010. Dietary reference    intakes for calcium and D. [place]: The    [employer]. 2. Holick MF, Binkley , Bischoff-Ferrari HA, et al.    Evaluation, treatment, and prevention of vitamin D    deficiency: an Endocrine Society clinical practice    guideline. JCEM. 2011 Jul; 96(7):1911-30.   Magnesium     Status: None   Collection Time: 09/28/21  8:43 AM  Result Value Ref Range   Magnesium 2.2 1.6 - 2.3 mg/dL  Phosphorus     Status: None   Collection Time: 09/28/21  8:43  AM  Result Value Ref Range   Phosphorus 3.1 2.8 - 4.1 mg/dL  Lipid panel     Status: Abnormal   Collection Time: 09/28/21  8:44 AM  Result Value Ref Range   Cholesterol, Total 97 (L) 100 - 199 mg/dL   Triglycerides 54 0 - 149 mg/dL   HDL 49 >39 mg/dL   VLDL Cholesterol Cal 13 5 - 40 mg/dL   LDL Chol Calc (NIH) 35 0 - 99 mg/dL   Chol/HDL Ratio 2.0 0.0 - 5.0 ratio    Comment:                                   T. Chol/HDL Ratio                                             Men  Women                               1/2 Avg.Risk  3.4    3.3                                   Avg.Risk  5.0    4.4                                2X Avg.Risk  9.6    7.1                                3X Avg.Risk 23.4   11.0   Hepatic function panel     Status: Abnormal   Collection Time: 09/28/21  8:44 AM  Result Value Ref Range   Total Protein 6.7 6.0 - 8.5 g/dL   Albumin 4.4 3.7 - 4.7 g/dL   Bilirubin Total 0.6 0.0 - 1.2 mg/dL   Bilirubin, Direct 0.20 0.00 - 0.40 mg/dL   Alkaline Phosphatase 110 44 - 121 IU/L   AST 124  (H) 0 - 40 IU/L   ALT 165 (H) 0 - 44 IU/L    Assessment: 1. Hypercalcemia / Hyperparathyroidism 2.  Osteopenia  Plan: His previsit repeat labs show PTH improving to 47 along with stable calcium at 10.3.  He is benefiting from low-dose Sensipar.   His bone density is still consistent with osteopenia of femur.  His lumbar spine was excluded due to degenerative joint diseases. His repeat 24-hour urine calcium is 221.  This is still consistent with primary hyperparathyroidism at a mild form.  He does not have criteria for considering surgery at this time.   -He is on vitamin D supplement, currently therapeutic at 53.    He is advised to lower vitamin D3 to 1000 units daily, advised to stay off of calcium supplements.  He will also continue to benefit from low-dose Sensipar, 30 mg p.o. daily at breakfast.   He will have repeat PTH/calcium for his next visit in 6 months.  He will be due for next bone density in March 2024.   -He is advised to continue his primary care follow-up with Dr. Posey Pronto.  I spent 25 minutes in the care of the patient today including review of labs from Thyroid Function, CMP, and other relevant labs ; imaging/biopsy records (current and previous including abstractions from other facilities); face-to-face time discussing  his lab results and symptoms, medications doses, his options of short and long term treatment based on the latest standards of care / guidelines;   and documenting the encounter.  Saunders Glance  participated in the discussions, expressed understanding, and voiced agreement with the above plans.  All questions were answered to his satisfaction. he is encouraged to contact clinic should he have any questions or concerns prior to his return visit.    - Return in about 6 months (around 04/09/2022) for F/U with Pre-visit Labs.   Glade Lloyd, MD [facility] [address] [place], [place]  [address] Phone: [phone]  Fax: [phone]    This note was partially dictated with voice recognition software. Similar sounding words can be transcribed inadequately or may not  be corrected upon review.  10/10/2021, 5:18 PM

## 2021-10-24 ENCOUNTER — Other Ambulatory Visit: Payer: Self-pay

## 2021-10-24 ENCOUNTER — Encounter: Payer: Medicare Other | Admitting: *Deleted

## 2021-10-24 VITALS — BP 127/79 | HR 79 | Resp 18 | Wt 166.0 lb

## 2021-10-24 DIAGNOSIS — Z006 Encounter for examination for normal comparison and control in clinical research program: Secondary | ICD-10-CM

## 2021-10-24 NOTE — Research (Signed)
V-[facility] Unscheduled Visit Patient presented today for an unscheduled Visit for a recheck of his Liver Enzymes.  Patient had labs drew from his primary care provider for medicare wellness visit on 09/20/2021.  Labs drew at that visit showed elevated AST and ALT levels.  Dr. Excell Seltzer contacted about patients levels and orders received to recheck patients levels in 4 weeks.  Today patient came in for those labs to be collected.  Upon this nurses assessment, patient reported shortness of breath and orthostatic hypotension.  This RN requested Dr. Riley Kill to come see patient in clinic for advise and assessment.   Dr. Riley Kill assessed and recommended we scheduled him to be seen by his primary cardiologist for medication adjustments. This RN was able to get the patient an appointment for 10/25/2021 at 1355 pm.  This RN will continue to follow patient.    Labs pending    Vital Signs   Date of Measurement _13___/_FEB____/_2023______ DD/MMM/YYYY  Height/Hight Unit and Weight/Weight Units ____166___________ Pounds  Pulse Rate _____57__________ Beats per Minute  Systolic Blood Pressure ____116_________   Diastolic Blood Pressure _____56__________  Blood Pressure Position ___Sitting_____________   Outpatient Encounter Medications as of 10/24/2021  Medication Sig   albuterol (VENTOLIN HFA) 108 (90 Base) MCG/ACT inhaler Inhale 2 puffs into the lungs every 6 (six) hours as needed for wheezing or shortness of breath.   aspirin 81 MG EC tablet Take 1 tablet (81 mg total) by mouth daily. Swallow whole.   carvedilol (COREG) 6.25 MG tablet Take 1 tablet (6.25 mg total) by mouth 2 (two) times daily.   cholecalciferol (VITAMIN D3) 25 MCG (1000 UT) tablet Take 1,000 Units by mouth daily.   cinacalcet (SENSIPAR) 30 MG tablet Take 1 tablet (30 mg total) by mouth daily with breakfast.   gabapentin (NEURONTIN) 300 MG capsule Take 300 mg by mouth daily. (Patient not taking: Reported on 10/24/2021)   losartan (COZAAR) 25 MG  tablet Take 1 tablet (25 mg total) by mouth daily.   nitroGLYCERIN (NITROSTAT) 0.4 MG SL tablet Place 1 tablet (0.4 mg total) under the tongue every 5 (five) minutes x 3 doses as needed for chest pain.   tamsulosin (FLOMAX) 0.4 MG CAPS capsule Take 1 capsule (0.4 mg total) by mouth daily.   ticagrelor (BRILINTA) 90 MG TABS tablet Take 1 tablet (90 mg total) by mouth 2 (two) times daily.   venlafaxine XR (EFFEXOR-XR) 150 MG 24 hr capsule Take 1 capsule (150 mg total) by mouth daily.   No facility-administered encounter medications on file as of 10/24/2021.        Were there any Adverse Events Experienced? Yes [x]    No[]   If yes, complete the following Description of event: __ Shortness of Breath _________   Is this AE related to an injection site Reaction Yes []    No[x]  If yes please provide information below Anatomical Location: ___________________ Laterality: ____________________________ Bruising: Yes []    No[]  Erythema: Yes []    No[]  Induration: Yes []    No[]  Lipodystrophy: Yes []    No[]  Necrosis: Yes []    No[]  Oedema: Yes []    No[]  Pain: Yes []    No[]  Pallor/Pigment Change: Yes []    No[]  Phlebitis: Yes []    No[]  Purtitus:   Yes []    No[]  Rash: Yes []    No[]  Tenderness:  Yes []    No[]  Ulceration: Yes []    No[]  Warmth: Yes []    No[]  Additional Symptoms: __________________  _____________________________________  Was the Adverse Event serious Yes []    No[x]   Serious  Criteria   _____________________________________  Start Date _13___/_FEB_/_2023______  DD/MMM/YYYY  End Date ____/_____/_______  DD/MMM/YYYY  Outcome []  Resolved/Recovered [x]  Not Recovered/Not Resolved []  Fatal  Severity  [x]  Mild []  Moderate []  Severe  Relationship to Study Treatment NONE   ____________________________________________  Action Treatment with Study Treatment [x]  No Action Taken [] Dose Interrupted []  Drug Withdrawal []  Not Applicable []  Unknown  Was a concomitant or additional  treatment given due to this adverse event Yes []    No[x]  (if yes, add meds/procedures to list)  If AE led to study discontinuation select Yes Yes []    No[x]     Were any Surgical/Medical Procedures reported since signing of Consent Yes []    No[x]    Surgery/Procedure: _____________________  Indication: ___________________________  Start Date: ____/_____/_______                     DD/MMM/YYYY Ongoing: Yes []    No[]   End Date: ____/_____/_______                    DD/MMM/YYYY   Has the subject been hospitalized since Qualifying Hospitalization Yes []    No[x]    Elective []  Planned []  Unplanned []  Date of Admission: ____/_____/_______                         DD/MMM/YYYY Reason for Admission:__________________ ____________________________________ _____________________________________  Was Subject Discharged: Yes []    No[]  Date of Discharge: ____/_____/_______                                DD/MMM/YYYY Discharge Disposition: _________________    Total number of days in intensive care unit or coronary care unit:________________   Has the subject had any emergency room visits (for less than 24 hours) Yes []    No[x]    Date of Visit: ____/_____/_______                         DD/MMM/YYYY Was Visit for an AE: Yes []    No[]  Reason for Visit:__________________ ____________________________________ _____________________________________ Was a concomitant or additional treatment given due to this visit: Yes []    No[]    Has the subject had any other clinical or healthcare professional consultations or visits Yes [x]    No[]    Professional Type: _Primary Care Provider___________________  Date of Visit: _10___/_JAN____/_2023______                         DD/MMM/YYYY Was visit an Adverse Event: Yes []    No[x]  Reason for Visit: _____________________     Was a concomitant or additional treatment given due to this visit: Yes [x]    No[]     Form Based on: CFR Completion Guidelines Trial:  Number 2.0 Template Version 4.0, Template Effective Date: 25-Jul-2019

## 2021-10-24 NOTE — Research (Signed)
Patient seen.

## 2021-10-24 NOTE — Progress Notes (Signed)
Patient brought in for follow up labs following adjustment of statin dose and abnormal LFTs.  Requested by research coordinator to see.  Patient and his wife, who is knowledgeable, says he gets lightheaded when he stands up.   He also gets mildly short of breath going about 8 feet.  He had stopped his flomax but has resumed.  This also has occurred for awhile, but she thinks he just remembered it since stopping Neurontin.  He and his spouse have tried to some adjustments related to his medications.  Of note, he had DVT/PE in 2017 and was on short term anticoagulation, and no issues since.   He is able at certain times of day to go out and walk pretty good distances without any major shortness of breath.  He denies any chest pain.  Notably, he also has had some problems with ASA in past with epigastric discomfort.    He was randomized to open label inclisiran, but these symptoms are not temporally related.    BP 127/58 P54 supine.  No change in orthostatic BP, but P higher at 76. Lungs are clear.  NO JVD.  Cardiac rhythm regular.  No murmurs. Abdomen is soft.  No extremity edema, calf tenderness, or clinical signs of DVT.   Neurologically intact.   Multiple possibilities exist here.  Symptoms could be related to ticagrelor, with some component of orthostatis related to a combination of flomax and cardiac meds.   Per patient, not similar at all to PE/DVT.  Impression:  orthostatic symptoms and mild SOB                      Recent ACS                      Hyperlipidemia on statins/open lable inclisiran                      Prior DVT/PE 2017  Plan:    Attempting to arrange follow up with Dr. Tenny Craw earlier for medication adjustment.  Per abnormal LFTs, being repeated after statin adjustment by Dr. Excell Seltzer.  Hgb added to labs.  Case discussed in detail with patient and spouse.  Will discuss with Dr. Tenny Craw.   Billy Hunt. Riley Kill, MD, [facility], [facility] Medical Director, [facility] for Cardiovascular  Research and Education

## 2021-10-25 ENCOUNTER — Encounter: Payer: Self-pay | Admitting: *Deleted

## 2021-10-25 ENCOUNTER — Encounter: Payer: Self-pay | Admitting: Physician Assistant

## 2021-10-25 ENCOUNTER — Ambulatory Visit (INDEPENDENT_AMBULATORY_CARE_PROVIDER_SITE_OTHER): Payer: Medicare Other | Admitting: Physician Assistant

## 2021-10-25 VITALS — BP 110/56 | HR 62 | Ht 69.0 in | Wt 169.2 lb

## 2021-10-25 DIAGNOSIS — E782 Mixed hyperlipidemia: Secondary | ICD-10-CM

## 2021-10-25 DIAGNOSIS — Z006 Encounter for examination for normal comparison and control in clinical research program: Secondary | ICD-10-CM

## 2021-10-25 DIAGNOSIS — I10 Essential (primary) hypertension: Secondary | ICD-10-CM

## 2021-10-25 DIAGNOSIS — I214 Non-ST elevation (NSTEMI) myocardial infarction: Secondary | ICD-10-CM | POA: Diagnosis not present

## 2021-10-25 MED ORDER — CARVEDILOL 3.125 MG PO TABS: 3.1250 mg | ORAL_TABLET | Freq: Two times a day (BID) | ORAL | 3 refills | Status: DC

## 2021-10-25 NOTE — Progress Notes (Signed)
Office Visit    Patient Name: Billy Hunt Date of Encounter: 10/25/2021  PCP:  Lindell Spar, [place]  Cardiologist:  Dorris Carnes, MD  Advanced Practice Provider:  No care team member to display Electrophysiologist:  None   Chief Complaint    Billy Hunt is a 76 y.o. male with a hx of NSTEMI, CAD, hypertension, hyperparathyroidism, BPH, history of PE/DVT, hyperlipidemia, gout, idiopathic peripheral neuropathy, and COPD presents today for follow-up visit for hypertension and SOB.  Patient is status post NSTEMI on 07/04/2021.  On admission he had chest burning/chest pain with nausea and vomiting.  Pain became worse later and he presented to the ED.  Blood pressure was elevated and he was treated with IV nitroglycerin.  EKG showed RBBB and ST depression in V2.  Troponins were 429>> 386. He had a cardiac catheterization on 10/24 with significant multivessel disease.  Culprit lesion was felt to be occluded proximal/ostial OM 2 treated with PCI/DES x1.  He was placed on DAPT therapy with aspirin and Brilinta for 1 year.  He did have significant disease in his LAD which was planned for staged intervention.  He underwent repeat cardiac catheterization 07/06/2021 with successful PCI to LAD and DES x1.  Echocardiogram with EF of 60 to 65%, G1 DD, no wall motion abnormalities.  He had hypertensive urgency and was tolerating losartan 25 mg daily and Coreg 6.25 mg p.o. twice daily.  LDL was 155 so he was started on atorvastatin 80 mg.  He was last seen in the office November 2022 and has been doing much better.  He denies any chest pain/pressure, neck pain, back pain, and jaw pain.  He denied any nausea, vomiting, diaphoresis.  He did feel little dizzy/lightheaded at times but had no presyncope or syncopal episodes.  No bleeding issues on DAPT therapy.  Today, he states that he continues to have some shortness of breath when he stands up that lasts for a couple  of seconds.  He does not have any exertional shortness of breath.  He also states that he sometimes gets short of breath with urination.  His wife presented me with a blood pressure log and most of his systolic blood pressures were between 100-120.  He states that he is felt a bit foggy and not like himself ever since October of last year.  He used to enjoy writing music and playing the guitar and singing however he is not done this since his surgery.  He was recently taken off of atorvastatin 80 mg and started on a injectable trial of Leqvio and updated liver function test were ordered yesterday.  His wife believes that it could be the Brilinta that is causing the shortness of breath and has expressed interest in switching his antiplatelet agent.  She states she believes he was on Xarelto back when he had a DVT and he tolerated that just fine.  He is slowly trying to increase his exercise tolerance with walking.  Reports no shortness of breath nor dyspnea on exertion. Reports no chest pain, pressure, or tightness. No edema, orthopnea, PND. Reports no palpitations.    Past Medical History    Past Medical History:  Diagnosis Date   Anxiety    BPH (benign prostatic hyperplasia)    Gout    Hypercholesterolemia    Hyperparathyroidism ([place])    Hypertension    Pulmonary emboli (HCC)    Vitamin D deficiency    Vitamin D  deficiency disease 10/07/2019   Past Surgical History:  Procedure Laterality Date   CORONARY STENT INTERVENTION N/A 07/04/2021   Procedure: CORONARY STENT INTERVENTION;  Surgeon: Nelva Bush, MD;  Location: [facility];  Service: Cardiovascular;  Laterality: N/A;   CORONARY STENT INTERVENTION N/A Jul 19, 2021   Procedure: CORONARY STENT INTERVENTION;  Surgeon: Troy Sine, MD;  Location: [facility];  Service: Cardiovascular;  Laterality: N/A;   LEFT HEART CATH AND CORONARY ANGIOGRAPHY N/A 07/04/2021   Procedure: LEFT HEART CATH AND CORONARY ANGIOGRAPHY;   Surgeon: Nelva Bush, MD;  Location: [facility];  Service: Cardiovascular;  Laterality: N/A;   PERIPHERAL VASCULAR THROMBECTOMY     TRANSURETHRAL RESECTION OF PROSTATE     VASECTOMY      Allergies  Allergies  Allergen Reactions   Sulfa Antibiotics Hives   Sulfamethoxazole Rash     EKGs/Labs/Other Studies Reviewed:   The following studies were reviewed today: PCI 07/19/2021 Dr. Claiborne Billings CORONARY STENT INTERVENTION    Conclusion       Ost LM to Mid LM lesion is 20% stenosed.   Prox LAD lesion is 30% stenosed.   Mid LAD-2 lesion is 70% stenosed.   Prox Cx to Mid Cx lesion is 40% stenosed.   1st Diag-1 lesion is 30% stenosed.   1st Diag-2 lesion is 70% stenosed.   2nd Diag lesion is 80% stenosed.   1st Mrg lesion is 70% stenosed.   Mid LAD-1 lesion is 90% stenosed.   Mid LAD-3 lesion is 60% stenosed.   Non-stenotic 2nd Mrg lesion was previously treated.   A drug-eluting stent was successfully placed.   Post intervention, there is a 0% residual stenosis.   Post intervention, there is a 0% residual stenosis.   Post intervention, there is a 0% residual stenosis.   Successful staged PCI to the LAD in this patient who is 2 days status post initial presentation with ACS and total occlusion of the circumflex marginal vessel which was stented.   The LAD had diffuse disease of 90, 70 % and 60% between the 1st and second diagonal vessel which was successfully stented with a 2.5 x 38 mm Onyx frontier DES stent postdilated to 2.75 mm with the stenosis being reduced to 0%.   RECOMMENDATION: DAPT for minimum of 12 months in this patient status post ACS 2 days previously with successful stenting of the circumflex marginal vessel, and is successful staged PCI to diffusely diseased mid LAD today.  Medical therapy for concomitant RCA disease.  Aggressive lipid-lowering therapy with target LDL less than 70 and optimal blood pressure control.  Diagnostic Dominance:  Right Intervention          PCI 07/04/2021 Dr. Saunders Revel CORONARY STENT INTERVENTION  LEFT HEART CATH AND CORONARY ANGIOGRAPHY    Conclusion   Conclusions: Significant multivessel coronary artery disease, as detailed below.  Culprit lesion for the patient's NSTEMI is likely occluded ostial/proximal OM2 branch.  There is also severe mid LAD and D1 disease. Low normal left ventricular filling pressure (LVEDP ~5 mmHg). Successful PCI to OM2 using Onyx Frontier 2.5 x 26 mm drug-eluting stent with 0% residual stenosis and TIMI-3 flow.   Recommendations: Continue cangrelor infusion for 2 hours following ticagrelor load. Aggressive secondary prevention. Wean IV nitroglycerin as tolerated. Consider staged PCI to mid LAD; timing (during this admission versus as an outpatient in the next few weeks) to be determined based on symptoms.     Diagnostic Dominance: Right Intervention  Echocardiogram 07/04/2021 1. Left ventricular ejection fraction, by estimation, is 60 to 65%. The  left ventricle has normal function. The left ventricle has no regional  wall motion abnormalities. The left ventricular internal cavity size was  mildly dilated. There is mild left  ventricular hypertrophy. Left ventricular diastolic parameters are  consistent with Grade I diastolic dysfunction (impaired relaxation).   2. Right ventricular systolic function is normal. The right ventricular  size is normal. There is mildly elevated pulmonary artery systolic  pressure.   3. Left atrial size was mildly dilated.   4. The mitral valve is normal in structure. Mild mitral valve  regurgitation.   5. The aortic valve is tricuspid. Aortic valve regurgitation is mild.  Mild aortic valve sclerosis is present, with no evidence of aortic valve  stenosis.   Comparison(s): The left ventricular function is unchanged.          Assessment and Plan:   1. NSTEMI (non-ST elevated myocardial infarction) ([place])   2.  Essential hypertension   3. Mixed hyperlipidemia   4. Medication management     EKG:  EKG is not ordered today.  Recent Labs: 07/05/2021: BUN 13; Creatinine, Ser 0.89; Potassium 3.5; Sodium 137 07/06/2021: Hemoglobin 14.0; Platelets 193 09/28/2021: ALT 165; Magnesium 2.2  Recent Lipid Panel    Component Value Date/Time   CHOL 97 (L) 09/28/2021 0844   TRIG 54 09/28/2021 0844   HDL 49 09/28/2021 0844   CHOLHDL 2.0 09/28/2021 0844   CHOLHDL 4.4 07/05/2021 0320   VLDL 9 07/05/2021 0320   LDLCALC 35 09/28/2021 0844    Home Medications   Current Meds  Medication Sig   albuterol (VENTOLIN HFA) 108 (90 Base) MCG/ACT inhaler Inhale 2 puffs into the lungs every 6 (six) hours as needed for wheezing or shortness of breath.   aspirin 81 MG EC tablet Take 1 tablet (81 mg total) by mouth daily. Swallow whole.   cholecalciferol (VITAMIN D3) 25 MCG (1000 UT) tablet Take 1,000 Units by mouth daily.   cinacalcet (SENSIPAR) 30 MG tablet Take 1 tablet (30 mg total) by mouth daily with breakfast.   losartan (COZAAR) 25 MG tablet Take 1 tablet (25 mg total) by mouth daily.   nitroGLYCERIN (NITROSTAT) 0.4 MG SL tablet Place 1 tablet (0.4 mg total) under the tongue every 5 (five) minutes x 3 doses as needed for chest pain.   tamsulosin (FLOMAX) 0.4 MG CAPS capsule Take 1 capsule (0.4 mg total) by mouth daily.   ticagrelor (BRILINTA) 90 MG TABS tablet Take 1 tablet (90 mg total) by mouth 2 (two) times daily.   venlafaxine XR (EFFEXOR-XR) 150 MG 24 hr capsule Take 1 capsule (150 mg total) by mouth daily.   [DISCONTINUED] carvedilol (COREG) 6.25 MG tablet Take 1 tablet (6.25 mg total) by mouth 2 (two) times daily.     Review of Systems      All other systems reviewed and are otherwise negative except as noted above.  Physical Exam    VS:  BP (!) 110/56 (BP Location: Right Arm, Patient Position: Sitting, Cuff Size: Normal)    Pulse 62    Ht 5\' 9"  (1.753 m)    Wt 169 lb 3.2 oz (76.7 kg)    SpO2 97%     BMI 24.99 kg/m  , BMI Body mass index is 24.99 kg/m.  Wt Readings from Last 3 Encounters:  10/25/21 169 lb 3.2 oz (76.7 kg)  10/24/21 166 lb (75.3 kg)  10/10/21 171 lb 3.2 oz (  77.7 kg)     GEN: Well nourished, well developed, in no acute distress. HEENT: normal. Neck: Supple, no JVD, carotid bruits, or masses. Cardiac: RRR, no murmurs, rubs, or gallops. No clubbing, cyanosis, edema.  Radials/PT 2+ and equal bilaterally.  Respiratory:  Respirations regular and unlabored, clear to auscultation bilaterally. GI: Soft, nontender, nondistended. MS: No deformity or atrophy. Skin: Warm and dry, no rash. Neuro:  Strength and sensation are intact. Psych: Normal affect.  Assessment & Plan    NSTEMI -s/p staged PCI -No issues with chest pain but does endorse getting short of breath for couple seconds when he stands up and also with urination  Hypertension -Blood pressure has been on the low side as well as heart rate based on patient log.  Will decrease Coreg to 3.125 mg twice a day  Mixed Hyperlipidemia -Was started on atorvastatin 80 mg, then liver function tests increased.  Now he is involved in a trial with Leqvio (unsure of dose) -Follow-up LFTs were ordered yesterday -I did not see follow-up lipid panel which she will need    Disposition: Follow up 4 months with Dorris Carnes, MD or APP.  Signed, Elgie Collard, PA-C 10/25/2021, 2:45 PM [facility]

## 2021-10-25 NOTE — Patient Instructions (Signed)
Medication Instructions:   DECREASE Coreg one (1) tablet by mouth ( 3.125 mg) twice daily.   *If you need a refill on your cardiac medications before your next appointment, please call your pharmacy*  Lab Work:  None ordered.   If you have labs (blood work) drawn today and your tests are completely normal, you will receive your results only by: MyChart Message (if you have MyChart) OR A paper copy in the mail If you have any lab test that is abnormal or we need to change your treatment, we will call you to review the results.  Testing/Procedures:   None ordered.   Follow-Up: At [facility], you and your health needs are our priority.  As part of our continuing mission to provide you with exceptional heart care, we have created designated Provider Care Teams.  These Care Teams include your primary Cardiologist (physician) and Advanced Practice Providers (APPs -  Physician Assistants and Nurse Practitioners) who all work together to provide you with the care you need, when you need it.  We recommend signing up for the patient portal called "MyChart".  Sign up information is provided on this After Visit Summary.  MyChart is used to connect with patients for Virtual Visits (Telemedicine).  Patients are able to view lab/test results, encounter notes, upcoming appointments, etc.  Non-urgent messages can be sent to your provider as well.   To learn more about what you can do with MyChart, go to [email or web address].    Your next appointment:   3 month(s)  The format for your next appointment:   In Person  Provider:   Dietrich Pates, MD

## 2021-10-25 NOTE — Research (Signed)
Return call placed to [employer] about patients Hepatic panel.   I was told that the lab is still in process and that it should be resulted tomorrow morning.

## 2021-10-25 NOTE — Research (Signed)
Contacted [facility] in referencing to the hepatic panel drawn yesterday.  At 1330, I was told the lab was still in process.  Will continue to follow for results

## 2021-10-26 ENCOUNTER — Telehealth: Payer: Self-pay

## 2021-10-26 ENCOUNTER — Encounter: Payer: Self-pay | Admitting: *Deleted

## 2021-10-26 DIAGNOSIS — Z006 Encounter for examination for normal comparison and control in clinical research program: Secondary | ICD-10-CM

## 2021-10-26 LAB — HEPATIC FUNCTION PANEL
ALT: 109 IU/L — ABNORMAL HIGH (ref 0–44)
AST: 67 IU/L — ABNORMAL HIGH (ref 0–40)
Albumin: 3.9 g/dL (ref 3.7–4.7)
Alkaline Phosphatase: 96 IU/L (ref 44–121)
Bilirubin Total: <0.2 mg/dL (ref 0.0–1.2)
Bilirubin, Direct: <0.1 mg/dL (ref 0.00–0.40)
Total Protein: 7.4 g/dL (ref 6.0–8.5)

## 2021-10-26 MED ORDER — CLOPIDOGREL BISULFATE 75 MG PO TABS: ORAL_TABLET | ORAL | 3 refills | Status: DC

## 2021-10-26 NOTE — Telephone Encounter (Signed)
-----   Message from Sharlene Dory, [place] sent at 10/26/2021  4:03 PM EST ----- Can we have this patient discontinue Brilinta and then start a Plavix load (one time dose) of 300mg  12 hours after their last dose of Brilinta and then 75mg  daily after.  Thank you,   , PA-C  ----- Message ----- From: , MD Sent: 10/26/2021   2:02 PM EST To: Pricilla Riffle, PA-C  I would place on Plavix 75 mg  daily   ----- Message ----- From: 10/28/2021 Sent: 10/25/2021   2:49 PM EST To: Bunnie Domino, MD  Patient's biggest complaint is shortness of breath when standing up that lasts a few seconds.  He does not have any exertional shortness of breath.  Dr. 10/27/2021 saw the patient yesterday and thought it could be his Brilinta.  Of note, he did well on Xarelto back in 2016 when he was being treated for DVT.  Do you have a preference on antiplatelet for this patient?  Thank you!

## 2021-10-26 NOTE — Telephone Encounter (Signed)
Called and reviewed recommendation with patient. He understands to wait 12 hours after his last Brilinta dose to begin Plavix. Pt also understands that he will take 4 tablets (300mg  loading dose), then decrease to maintenance dose of 1 tablet daily thereafter. Sent medication to pharmacy on file.

## 2021-10-26 NOTE — Addendum Note (Signed)
Addended by: Wendy Poet on: 10/26/2021 09:01 AM   Modules accepted: Orders

## 2021-10-26 NOTE — Research (Signed)
Unscheduled Visit 1/Partial lab results  Labs reviewed with Dr Excell Seltzer.  Recommended that patient have a liver ultrasound and recheck Hepatic Panel in 4 weeks.  Patient Is scheduled for 10/31/2021 for his next injection for V-Inception Research Study.

## 2021-10-26 NOTE — Progress Notes (Signed)
Pt scheduled for abdominal US 11/04/2021. Next study injection scheduled 10/31/2021. OK to proceed with study medication injection as scheduled, then undergo ultrasound study as scheduled.   Tonny Bollman 10/26/2021 12:46 PM

## 2021-10-27 DIAGNOSIS — G629 Polyneuropathy, unspecified: Secondary | ICD-10-CM | POA: Diagnosis not present

## 2021-10-27 NOTE — Research (Signed)
Unscheduled Visit Labs

## 2021-10-31 ENCOUNTER — Other Ambulatory Visit: Payer: Self-pay

## 2021-10-31 ENCOUNTER — Encounter: Payer: Medicare Other | Admitting: *Deleted

## 2021-10-31 VITALS — BP 111/47 | HR 58 | Resp 18 | Wt 166.0 lb

## 2021-10-31 DIAGNOSIS — Z006 Encounter for examination for normal comparison and control in clinical research program: Secondary | ICD-10-CM

## 2021-10-31 MED ORDER — STUDY - V-INCEPTION - INCLISIRAN (KJX839) 284 MG/1.5 ML SQ INJECTION (PI-COOPER)
284.0000 mg | PREFILLED_SYRINGE | Freq: Once | SUBCUTANEOUS | Status: AC
  Administered 2021-10-31: 284 mg via SUBCUTANEOUS
  Filled 2021-10-31: qty 1.5

## 2021-10-31 NOTE — Progress Notes (Signed)
Hepatic Panel from previous Visit

## 2021-10-31 NOTE — Research (Signed)
V-Inception Visit 3 Day 54  Patient is going well at his Visit 3.  No complaints today.  Stated that his shortness of breth is a lot better since switching off the Brilinta.  Patient has a liver ultrasound on Friday this coming week to assess why he has increased liver enzymes.  Patient to follow-up with cardiology after ultrasound for results.   Vital Signs   Date of Measurement _20___/_FEB____/_2023______ DD/MMM/YYYY  Weight/Weight Units ____166___________ Pounds  Pulse Rate _____57__________ Beats per Minute  Systolic Blood Pressure [id number]   Diastolic Blood Pressure 0000000  Blood Pressure Position ___Sitting_____________     Was smoking status assessed [x]   Yes   OR   []  NO   Type of Substance []  Tobacco - []   Yes   OR   []  NO                     [x] Former                     []  Current                     [] Never []  Chewing Tobacco - []   Yes   OR   [x]  NO                     [] Former                     []  Current                     [] Never []  Marijuana - [x]   Yes   OR   []  NO                     [] Former                     [x]  Current                     [] Never [] Vape - []   Yes   OR   [x]  NO                     [] Former                     []  Current                      [] Never []  Other___________________________________     Was Musician Assessment performed [x]   Yes   OR   []  NO Date of Collection: _20_/_FEB_/_2023__                               DD/MMM/YYYY Was the Patient Fasting:  [x]   Yes OR  []  NO   Accession Number [id number]   Is the subject of childbearing potential? []   Yes   OR   [x]  NO   Serum Pregnancy Test: Was Central laboratory assessment performed?  (Visit 1and Unscheduled) []   Yes   OR   [x]  NO Reason Not done:_________________________  Hematology: Was Central Laboratory assessment performed? (Visit 1, 2, 5, and unscheduled) []   Yes   OR   [x]  NO Reason Not done:__Visit 3______________  Hepatitis: Was Central  Laboratory assessment performed? (Visit1 and Unscheduled) []   Yes   OR   [x]  NO  Reason Not done:Visit 3_________________  Chemistry: Was Central Laboratory Assessment performed? (Visit 1, 2, 3, 4, 5 and Unscheduled) [x]   Yes   OR   []  NO Reason Not done:_________________________  Lipoprotein(s): Was Central Laboratory Assessment performed?  (Visit 1, 2, 3, 4, 5 and Unscheduled) [x]   Yes   OR   []  NO Reason Not done:___Visit 3_____________  Coagulation: Was Central Laboratory Assessment performed? (Visit 1, 2, and unscheduled) []   Yes   OR   [x]  NO Reason Not done:_____Visit 3________  Fasting Glucose: Was Central Laboratory Assessment performed?  (Visit 1, 2, 3, 4, 5 and Unscheduled) [x]   Yes   OR   []  NO Reason Not done:_________________________  BioBank Sample: Was Central Laboratory Assessment performed?  (Visit 2, 3, 4, 5 and Unscheduled) [x]   Yes   OR   []  NO Reason Not done:_________________________    Was Study Medication Administered? ?  Yes   OR   []  NO  Date of Dose Administered __20__/_FEB____/__2023_____ DD/MMM/YYYY  Dose Form   Dose Administered [x]  Inclisiran Sodium 300mg /1.73mL  Units [x] Syringe  Route [x]  Subcutaneous  Anatomical Location [x]  Arm []  Thigh []  Abdomen  Laterality [x]  Right []  Left   Were there any Adverse Events Experienced? Yes [x]    No[]   If yes, complete the following Description of event: ____Shortness of breath__________________   Is this AE related to an injection site Reaction Yes []    No[x]  If yes please provide information below Anatomical Location: ___________________ Laterality: ____________________________ Bruising: Yes []    No[]  Erythema: Yes []    No[]  Induration: Yes []    No[]  Lipodystrophy: Yes []    No[]  Necrosis: Yes []    No[]  Oedema: Yes []    No[]  Pain: Yes []    No[]  Pallor/Pigment Change: Yes []    No[]  Phlebitis: Yes []    No[]  Purtitus:   Yes []    No[]  Rash: Yes []    No[]  Tenderness:  Yes []    No[]  Ulceration: Yes []     No[]  Warmth: Yes []    No[]  Additional Symptoms: __________________  _____________________________________  Was the Adverse Event serious Yes []    No[x]   Serious Criteria Mild  _____________________________________  Start Date _13___/_FEB____/__2023_____  DD/MMM/YYYY  End Date _20___/_FEB____/_2023______  DD/MMM/YYYY  Outcome [x]  Resolved/Recovered []  Not Recovered/Not Resolved []  Fatal  Severity  [x]  Mild []  Moderate []  Severe  Relationship to Study Treatment none   ____________________________________________  Action Treatment with Study Treatment [x]  No Action Taken [] Dose Interrupted []  Drug Withdrawal []  Not Applicable []  Unknown  Was a concomitant or additional treatment given due to this adverse event Yes [x]    No[]  (if yes, add meds/procedures to list)  If AE led to study discontinuation select Yes Yes []    No[x]      Were any Surgical/Medical Procedures reported since signing of Consent Yes []    No[x]    Surgery/Procedure: _____________________  Indication: ___________________________  Start Date: ____/_____/_______                     DD/MMM/YYYY Ongoing: Yes []    No[]   End Date: ____/_____/_______                    DD/MMM/YYYY   Has the subject been hospitalized since Qualifying Hospitalization Yes []    No[x]    Elective []  Planned []  Unplanned []  Date of Admission: ____/_____/_______                         DD/MMM/YYYY Reason for Admission:__________________  ____________________________________ _____________________________________  Was Subject Discharged: Yes []    No[]  Date of Discharge: ____/_____/_______                                DD/MMM/YYYY Discharge Disposition: _________________    Total number of days in intensive care unit or coronary care unit:________________   Has the subject had any emergency room visits (for less than 24 hours) Yes []    No[x]    Date of Visit: ____/_____/_______                         DD/MMM/YYYY Was Visit  for an AE: Yes []    No[]  Reason for Visit:__________________ ____________________________________ _____________________________________ Was a concomitant or additional treatment given due to this visit: Yes []    No[]    Has the subject had any other clinical or healthcare professional consultations or visits Yes [x]    No[]    Professional Type: _Cardiology___________________  Date of Visit: _14___/_FEB____/_2023______                         DD/MMM/YYYY Was visit an Adverse Event: Yes [x]    No[]  Reason for Visit: _Shortness of Breath Was a concomitant or additional treatment given due to this visit: Yes [x]    No[]     Outpatient Encounter Medications as of 10/31/2021  Medication Sig Note   albuterol (VENTOLIN HFA) 108 (90 Base) MCG/ACT inhaler Inhale 2 puffs into the lungs every 6 (six) hours as needed for wheezing or shortness of breath.    aspirin 81 MG EC tablet Take 1 tablet (81 mg total) by mouth daily. Swallow whole.    carvedilol (COREG) 3.125 MG tablet Take 1 tablet (3.125 mg total) by mouth 2 (two) times daily.    cholecalciferol (VITAMIN D3) 25 MCG (1000 UT) tablet Take 1,000 Units by mouth daily.    cinacalcet (SENSIPAR) 30 MG tablet Take 1 tablet (30 mg total) by mouth daily with breakfast.    clopidogrel (PLAVIX) 75 MG tablet Take 4 tablets (300mg  loading dose) by mouth once approximately 12 hours after last dose of Brilinta, then decrease to 1 tablet daily thereafter    losartan (COZAAR) 25 MG tablet Take 1 tablet (25 mg total) by mouth daily.    nitroGLYCERIN (NITROSTAT) 0.4 MG SL tablet Place 1 tablet (0.4 mg total) under the tongue every 5 (five) minutes x 3 doses as needed for chest pain.    tamsulosin (FLOMAX) 0.4 MG CAPS capsule Take 1 capsule (0.4 mg total) by mouth daily.    venlafaxine XR (EFFEXOR-XR) 150 MG 24 hr capsule Take 1 capsule (150 mg total) by mouth daily.    [DISCONTINUED] carvedilol (COREG) 6.25 MG tablet Take 1 tablet (6.25 mg total) by mouth 2 (two) times  daily.    [DISCONTINUED] gabapentin (NEURONTIN) 300 MG capsule Take 300 mg by mouth daily. (Patient not taking: Reported on 10/24/2021)    [DISCONTINUED] ticagrelor (BRILINTA) 90 MG TABS tablet Take 1 tablet (90 mg total) by mouth 2 (two) times daily. 10/26/2021: change to plavix   [EXPIRED] Study - V-INCEPTION - inclisiran [id number]) 284 mg/1.5 mL SQ injection ([facility])     No facility-administered encounter medications on file as of 10/31/2021.     Form Based on: CFR Completion Guidelines Trial: [id number] Number 2.0 Template Version 4.0, Template Effective Date: 25-Jul-2019

## 2021-11-03 NOTE — Progress Notes (Addendum)
Patient seen today along with research coordinator.  Symptoms are better from a few days ago.  Brilinta was changed to Plavix, and I went over in detail with him today the issue of DAPT, and clopidogrel hyporesponsiveness.   He has stopped his ASA which chronically causes him some issues.    Exam today is unremarkable.  Patient received first injection.   Suggested in follow up that monotherapy with clopidogrel would be reasonable, but that should have Verify Now prior to making that choice.  Discussed with patient in detail.    Arturo Morton. Riley Kill, MD, [facility], [facility] Medical Director, [facility]

## 2021-11-03 NOTE — Research (Signed)
Visit 3 Lab Results  Are there any labs that are clinically significant?  Yes []  OR No[] 

## 2021-11-04 ENCOUNTER — Other Ambulatory Visit: Payer: Self-pay

## 2021-11-04 ENCOUNTER — Ambulatory Visit (HOSPITAL_COMMUNITY)
Admission: RE | Admit: 2021-11-04 | Discharge: 2021-11-04 | Disposition: A | Discharge status: Home / Self Care | Payer: Medicare Other | Source: Ambulatory Visit | Attending: Cardiovascular Disease | Admitting: Cardiovascular Disease

## 2021-11-04 DIAGNOSIS — Z006 Encounter for examination for normal comparison and control in clinical research program: Secondary | ICD-10-CM | POA: Insufficient documentation

## 2021-11-04 DIAGNOSIS — R7401 Elevation of levels of liver transaminase levels: Secondary | ICD-10-CM | POA: Diagnosis not present

## 2021-11-04 DIAGNOSIS — R945 Abnormal results of liver function studies: Secondary | ICD-10-CM | POA: Diagnosis not present

## 2021-11-06 NOTE — Progress Notes (Signed)
LFT's improving. Liver US results pending. Continue current Rx.

## 2021-11-09 ENCOUNTER — Ambulatory Visit (INDEPENDENT_AMBULATORY_CARE_PROVIDER_SITE_OTHER): Payer: Medicare Other | Admitting: Internal Medicine

## 2021-11-09 ENCOUNTER — Encounter: Payer: Self-pay | Admitting: Internal Medicine

## 2021-11-09 ENCOUNTER — Other Ambulatory Visit: Payer: Self-pay

## 2021-11-09 VITALS — BP 112/62 | HR 69 | Resp 18 | Ht 69.0 in | Wt 168.8 lb

## 2021-11-09 DIAGNOSIS — F411 Generalized anxiety disorder: Secondary | ICD-10-CM | POA: Diagnosis not present

## 2021-11-09 DIAGNOSIS — R7303 Prediabetes: Secondary | ICD-10-CM | POA: Diagnosis not present

## 2021-11-09 DIAGNOSIS — E782 Mixed hyperlipidemia: Secondary | ICD-10-CM | POA: Diagnosis not present

## 2021-11-09 DIAGNOSIS — E559 Vitamin D deficiency, unspecified: Secondary | ICD-10-CM | POA: Diagnosis not present

## 2021-11-09 DIAGNOSIS — I10 Essential (primary) hypertension: Secondary | ICD-10-CM

## 2021-11-09 DIAGNOSIS — G609 Hereditary and idiopathic neuropathy, unspecified: Secondary | ICD-10-CM | POA: Diagnosis not present

## 2021-11-09 DIAGNOSIS — R350 Frequency of micturition: Secondary | ICD-10-CM

## 2021-11-09 DIAGNOSIS — I25110 Atherosclerotic heart disease of native coronary artery with unstable angina pectoris: Secondary | ICD-10-CM

## 2021-11-09 DIAGNOSIS — Z1159 Encounter for screening for other viral diseases: Secondary | ICD-10-CM

## 2021-11-09 DIAGNOSIS — N401 Enlarged prostate with lower urinary tract symptoms: Secondary | ICD-10-CM | POA: Diagnosis not present

## 2021-11-09 DIAGNOSIS — E210 Primary hyperparathyroidism: Secondary | ICD-10-CM

## 2021-11-09 MED ORDER — VENLAFAXINE HCL ER 150 MG PO CP24: 150.0000 mg | ORAL_CAPSULE | Freq: Every day | ORAL | 3 refills | Status: DC

## 2021-11-09 MED ORDER — TAMSULOSIN HCL 0.4 MG PO CAPS: 0.4000 mg | ORAL_CAPSULE | Freq: Every day | ORAL | 3 refills | Status: DC

## 2021-11-09 NOTE — Patient Instructions (Addendum)
Please continue taking medications as prescribed. ? ?Please continue to follow low salt diet and ambulate as tolerated. ? ?Please get fasting blood tests done before the next visit. ? ?Please consider getting Shingrix and TDaP vaccine at your local pharmacy. ?

## 2021-11-09 NOTE — Assessment & Plan Note (Signed)
S/p TURP ?On Tamsulosin, refilled ?

## 2021-11-09 NOTE — Assessment & Plan Note (Signed)
S/p PCI X 2 On Aspirin, Plavix and Leqvio No chest pain or dyspnea currently Followed by Cardiology 

## 2021-11-09 NOTE — Assessment & Plan Note (Addendum)
Has transaminitis with statin ?On Leqvio currently, in research trial ?Followed by Cardiology ?

## 2021-11-09 NOTE — Assessment & Plan Note (Signed)
Has titrated and stopped Gabapentin now ?Denies major neuropathic pain, numbness or tingling ?

## 2021-11-09 NOTE — Progress Notes (Signed)
Established Patient Office Visit  Subjective:  Patient ID: Billy Hunt, male    DOB: December 10, 1945  Age: 76 y.o. MRN: [id number]  CC:  Chief Complaint  Patient presents with   Follow-up    4 month follow up HTN and CAD pt has been seeing cardiology and has stopped bp meds through cardiology as heart rate was dropping too low has blood pressure and heart rate reading with him today     HPI Billy Hunt is a 76 y.o. male with past medical history of CAD s/p stent placement, hyperparathyroidism, osteopenia, BPH, PE, HLD and anxiety who presents for f/u of her chronic medical conditions.  CAD and HTN:  BP is well-controlled.  He has stopped taking losartan and Coreg now as he was having bradycardia and borderline low BP. Patient denies headache, dizziness, chest pain, dyspnea or palpitations. Home BP readings were reviewed today. He is on Aspirin and Plavix currently. He is in research study with Leqvio for HLD and is tolerating it well. His liver enzymes were elevated with statin, which have started to trend down since stopping it.  BPH: Currently well-controlled with Flomax, requests a refill.      Past Medical History:  Diagnosis Date   Acute coronary syndrome ([place]) 07/04/2021   Anxiety    BPH (benign prostatic hyperplasia)    Gout    Hypercholesterolemia    Hyperparathyroidism ([place])    Hypertension    Pulmonary emboli ([place])    Vitamin D deficiency    Vitamin D deficiency disease 10/07/2019    Past Surgical History:  Procedure Laterality Date   CORONARY STENT INTERVENTION N/A 07/04/2021   Procedure: CORONARY STENT INTERVENTION;  Surgeon: Nelva Bush, MD;  Location: [place] [facility];  Service: Cardiovascular;  Laterality: N/A;   CORONARY STENT INTERVENTION N/A 07/06/2021   Procedure: CORONARY STENT INTERVENTION;  Surgeon: Troy Sine, MD;  Location: [place];  Service: Cardiovascular;  Laterality: N/A;   LEFT HEART CATH AND CORONARY ANGIOGRAPHY  N/A 07/04/2021   Procedure: LEFT HEART CATH AND CORONARY ANGIOGRAPHY;  Surgeon: Nelva Bush, MD;  Location: [facility];  Service: Cardiovascular;  Laterality: N/A;   PERIPHERAL VASCULAR THROMBECTOMY     TRANSURETHRAL RESECTION OF PROSTATE     VASECTOMY      Family History  Problem Relation Age of Onset   Alzheimer's disease Mother        Vascular Dementia   Stroke Mother    Breast cancer Mother    Cancer Father        Multiple Myeloma   Hypertension Father    Deep vein thrombosis Brother    Heart disease Brother    Cancer Paternal Grandmother    Alcohol abuse Paternal Grandfather     Social History   Socioeconomic History   Marital status: Divorced    Spouse name: Not on file   Number of children: 2   Years of education: 12   Highest education level: Master's degree (e.g., MA, MS, MEng, MEd, MSW, MBA)  Occupational History   Not on file  Tobacco Use   Smoking status: Former    Packs/day: 1.00    Years: 10.00    Pack years: 10.00    Types: Cigarettes    Quit date: 04/21/1971    Years since quitting: 50.5   Smokeless tobacco: Never  Vaping Use   Vaping Use: Never used  Substance and Sexual Activity   Alcohol use: No    Comment:  former   Drug use: Yes    Types: Marijuana    Comment: last use: 9/30   Sexual activity: Yes    Birth control/protection: Other-see comments  Other Topics Concern   Not on file  Social History Narrative   Not on file   Social Determinants of Health   Financial Resource Strain: Low Risk    Difficulty of Paying Living Expenses: Not hard at all  Food Insecurity: No Food Insecurity   Worried About Charity fundraiser in the Last Year: Never true   [place] in the Last Year: Never true  Transportation Needs: No Transportation Needs   Lack of Transportation (Medical): No   Lack of Transportation (Non-Medical): No  Physical Activity: Sufficiently Active   Days of Exercise per Week: 5 days   Minutes of Exercise per  Session: 40 min  Stress: No Stress Concern Present   Feeling of Stress : Not at all  Social Connections: Socially Isolated   Frequency of Communication with Friends and Family: Three times a week   Frequency of Social Gatherings with Friends and Family: Once a week   Attends Religious Services: Never   Marine scientist or Organizations: No   Attends Music therapist: Never   Marital Status: Divorced  Human resources officer Violence: Unknown   Fear of Current or Ex-Partner: No   Emotionally Abused: No   Physically Abused: Not on file   Sexually Abused: No    Outpatient Medications Prior to Visit  Medication Sig Dispense Refill   aspirin 81 MG EC tablet Take 1 tablet (81 mg total) by mouth daily. Swallow whole. 90 tablet 1   cholecalciferol (VITAMIN D3) 25 MCG (1000 UT) tablet Take 1,000 Units by mouth daily.     cinacalcet (SENSIPAR) 30 MG tablet Take 1 tablet (30 mg total) by mouth daily with breakfast. 30 tablet 3   clopidogrel (PLAVIX) 75 MG tablet Take 4 tablets (34m loading dose) by mouth once approximately 12 hours after last dose of Brilinta, then decrease to 1 tablet daily thereafter 90 tablet 3   nitroGLYCERIN (NITROSTAT) 0.4 MG SL tablet Place 1 tablet (0.4 mg total) under the tongue every 5 (five) minutes x 3 doses as needed for chest pain. 25 tablet 2   tamsulosin (FLOMAX) 0.4 MG CAPS capsule Take 1 capsule (0.4 mg total) by mouth daily. 90 capsule 1   venlafaxine XR (EFFEXOR-XR) 150 MG 24 hr capsule Take 1 capsule (150 mg total) by mouth daily. 90 capsule 1   albuterol (VENTOLIN HFA) 108 (90 Base) MCG/ACT inhaler Inhale 2 puffs into the lungs every 6 (six) hours as needed for wheezing or shortness of breath. (Patient not taking: Reported on 11/09/2021) 8 g 0   carvedilol (COREG) 3.125 MG tablet Take 1 tablet (3.125 mg total) by mouth 2 (two) times daily. (Patient not taking: Reported on 11/09/2021) 180 tablet 3   losartan (COZAAR) 25 MG tablet Take 1 tablet (25 mg  total) by mouth daily. (Patient not taking: Reported on 11/09/2021) 90 tablet 1   No facility-administered medications prior to visit.    Allergies  Allergen Reactions   Sulfa Antibiotics Hives   Sulfamethoxazole Rash    ROS Review of Systems  Constitutional:  Negative for chills and fever.  HENT:  Negative for congestion and sore throat.   Eyes:  Negative for pain and discharge.  Respiratory:  Negative for cough and shortness of breath.   Cardiovascular:  Negative for chest pain  and palpitations.  Gastrointestinal:  Negative for diarrhea, nausea and vomiting.  Endocrine: Negative for polydipsia and polyuria.  Genitourinary:  Negative for dysuria and hematuria.  Musculoskeletal:  Negative for neck pain and neck stiffness.  Skin:  Negative for rash.  Neurological:  Negative for dizziness, weakness and headaches.  Psychiatric/Behavioral:  Negative for agitation and behavioral problems.      Objective:    Physical Exam Vitals reviewed.  Constitutional:      General: He is not in acute distress.    Appearance: He is not diaphoretic.  HENT:     Head: Normocephalic and atraumatic.     Nose: Nose normal.     Mouth/Throat:     Mouth: Mucous membranes are moist.  Eyes:     General: No scleral icterus.    Extraocular Movements: Extraocular movements intact.  Cardiovascular:     Rate and Rhythm: Normal rate and regular rhythm.     Pulses: Normal pulses.     Heart sounds: Normal heart sounds. No murmur heard. Pulmonary:     Breath sounds: Normal breath sounds. No wheezing or rales.  Musculoskeletal:     Cervical back: Neck supple. No tenderness.     Right lower leg: No edema.     Left lower leg: No edema.  Skin:    General: Skin is warm.     Findings: No rash.  Neurological:     General: No focal deficit present.     Mental Status: He is alert and oriented to person, place, and time.  Psychiatric:        Mood and Affect: Mood normal.        Behavior: Behavior normal.     BP 112/62 (BP Location: Left Arm, Patient Position: Sitting, Cuff Size: Normal)    Pulse 69    Resp 18    Ht 5' 9"  (1.753 m)    Wt 168 lb 12.8 oz (76.6 kg)    SpO2 99%    BMI 24.93 kg/m  Wt Readings from Last 3 Encounters:  11/09/21 168 lb 12.8 oz (76.6 kg)  10/31/21 166 lb (75.3 kg)  10/25/21 169 lb 3.2 oz (76.7 kg)    No results found for: TSH Lab Results  Component Value Date   WBC 12.1 (H) 07/06/2021   HGB 14.0 07/06/2021   HCT 42.6 07/06/2021   MCV 93.8 07/06/2021   PLT 193 07/06/2021   Lab Results  Component Value Date   NA 137 07/05/2021   K 3.5 07/05/2021   CO2 24 07/05/2021   GLUCOSE 137 (H) 07/05/2021   BUN 13 07/05/2021   CREATININE 0.89 07/05/2021   BILITOT <0.2 10/24/2021   ALKPHOS 96 10/24/2021   AST 67 (H) 10/24/2021   ALT 109 (H) 10/24/2021   PROT 7.4 10/24/2021   ALBUMIN 3.9 10/24/2021   CALCIUM 10.3 (H) 09/28/2021   ANIONGAP 9 07/05/2021   EGFR 92 11/29/2020   Lab Results  Component Value Date   CHOL 97 (L) 09/28/2021   Lab Results  Component Value Date   HDL 49 09/28/2021   Lab Results  Component Value Date   LDLCALC 35 09/28/2021   Lab Results  Component Value Date   TRIG 54 09/28/2021   Lab Results  Component Value Date   CHOLHDL 2.0 09/28/2021   Lab Results  Component Value Date   HGBA1C 5.7 (H) 07/05/2021      Assessment & Plan:   Problem List Items Addressed This Visit  Cardiovascular and Mediastinum   HTN (hypertension) - Primary    BP Readings from Last 1 Encounters:  11/09/21 112/62  Well-controlled Recently discontinued Losartan and Coreg Advised DASH diet and moderate exercise/walking, at least 150 mins/week       Relevant Orders   TSH   CBC with Differential/Platelet   Coronary artery disease involving native coronary artery of native heart with unstable angina pectoris ([place])    S/p PCI X 2 On Aspirin, Plavix and Leqvio No chest pain or dyspnea currently Followed by Cardiology      Relevant  Orders   TSH   CMP14+EGFR   CBC with Differential/Platelet     Endocrine   Hyperparathyroidism, primary ([place])   Relevant Orders   TSH     Nervous and Auditory   Idiopathic peripheral neuropathy    Has titrated and stopped Gabapentin now Denies major neuropathic pain, numbness or tingling      Relevant Medications   venlafaxine XR (EFFEXOR-XR) 150 MG 24 hr capsule     Genitourinary   BPH (benign prostatic hyperplasia)    S/p TURP On Tamsulosin, refilled      Relevant Medications   tamsulosin (FLOMAX) 0.4 MG CAPS capsule   Other Relevant Orders   PSA     Other   Hyperlipidemia    Has transaminitis with statin On Leqvio currently, in research trial Followed by Cardiology      GAD (generalized anxiety disorder)    Well-controlled with Effexor      Relevant Medications   venlafaxine XR (EFFEXOR-XR) 150 MG 24 hr capsule   Other Visit Diagnoses     Prediabetes       Relevant Orders   Hemoglobin A1c   Vitamin D deficiency       Relevant Orders   VITAMIN D 25 Hydroxy (Vit-D Deficiency, Fractures)   Need for hepatitis C screening test       Relevant Orders   Hepatitis C Antibody       Meds ordered this encounter  Medications   venlafaxine XR (EFFEXOR-XR) 150 MG 24 hr capsule    Sig: Take 1 capsule (150 mg total) by mouth daily.    Dispense:  90 capsule    Refill:  3   tamsulosin (FLOMAX) 0.4 MG CAPS capsule    Sig: Take 1 capsule (0.4 mg total) by mouth daily.    Dispense:  90 capsule    Refill:  3    Follow-up: Return in about 6 months (around 05/12/2022) for CAD and blood tests review.    Lindell Spar, MD

## 2021-11-09 NOTE — Assessment & Plan Note (Signed)
BP Readings from Last 1 Encounters:  ?11/09/21 112/62  ? ?Well-controlled ?Recently discontinued Losartan and Coreg ?Advised DASH diet and moderate exercise/walking, at least 150 mins/week ? ?

## 2021-11-09 NOTE — Assessment & Plan Note (Signed)
Well-controlled with Effexor 

## 2021-11-18 ENCOUNTER — Telehealth: Payer: Self-pay | Admitting: *Deleted

## 2021-11-18 NOTE — Chronic Care Management (AMB) (Signed)
?  Chronic Care Management  ? ?Note ? ?11/18/2021 ?Name: Billy Hunt MRN: [id number] DOB: 05/16/1946 ? ?Billy Hunt is a 76 y.o. year old male who is a primary care patient of Lindell Spar, MD. I reached out to Saunders Glance by phone today in response to a referral sent by Billy Hunt PCP. ? ?[name] was given information about Chronic Care Management services today including:  ?CCM service includes personalized support from designated clinical staff supervised by his physician, including individualized plan of care and coordination with other care providers ?24/7 contact phone numbers for assistance for urgent and routine care needs. ?Service will only be billed when office clinical staff spend 20 minutes or more in a month to coordinate care. ?Only one practitioner may furnish and bill the service in a calendar month. ?The patient may stop CCM services at any time (effective at the end of the month) by phone call to the office staff. ?The patient is responsible for co-pay (up to 20% after annual deductible is met) if co-pay is required by the individual health plan.  ? ?Patient did not agree to enrollment in care management services and does not wish to consider at this time. ? ?Follow up plan: ?Patient declines engagement by the care management team.  The care management team is available to follow up with the patient after provider conversation with the patient regarding recommendation for care management engagement and subsequent re-referral to the care management team.  ? ?Laverda Sorenson  ?Care Guide, Embedded Care Coordination ?[facility]  ?Direct Dial: [phone] ? ?

## 2021-11-22 ENCOUNTER — Telehealth: Payer: Self-pay

## 2021-11-22 NOTE — Telephone Encounter (Signed)
Patient spouse dropped off note with shot information (paper in [place] box) ? ?Tetanus shot 03.09.2023 ? ?Shingles Shot 03.09.2023 and pending 04.09.2023. ?

## 2021-11-22 NOTE — Telephone Encounter (Signed)
Received patient living will sent to scan to be scanned into patient chart. ?

## 2021-11-23 NOTE — Telephone Encounter (Signed)
Documented in chart.

## 2021-11-29 ENCOUNTER — Other Ambulatory Visit: Payer: Self-pay

## 2021-11-29 ENCOUNTER — Encounter: Payer: Self-pay | Admitting: Internal Medicine

## 2021-11-29 ENCOUNTER — Ambulatory Visit (INDEPENDENT_AMBULATORY_CARE_PROVIDER_SITE_OTHER): Payer: Medicare Other | Admitting: Internal Medicine

## 2021-11-29 ENCOUNTER — Encounter: Payer: Medicare Other | Admitting: *Deleted

## 2021-11-29 VITALS — BP 126/75 | HR 73 | Ht 69.0 in | Wt 169.0 lb

## 2021-11-29 VITALS — BP 117/74 | HR 65 | Resp 18 | Ht 69.0 in | Wt 165.0 lb

## 2021-11-29 DIAGNOSIS — I25110 Atherosclerotic heart disease of native coronary artery with unstable angina pectoris: Secondary | ICD-10-CM | POA: Diagnosis not present

## 2021-11-29 DIAGNOSIS — I2510 Atherosclerotic heart disease of native coronary artery without angina pectoris: Secondary | ICD-10-CM | POA: Diagnosis not present

## 2021-11-29 DIAGNOSIS — Z006 Encounter for examination for normal comparison and control in clinical research program: Secondary | ICD-10-CM

## 2021-11-29 NOTE — Research (Signed)
[facility] Unscheduled Visit ? ?Patient presented today to recheck his liver enzymes.  Patient reported that he has been feeling much better since his last visit with research.  He stated that his shortness of breath is completely gone since switching from [place] to Plavix. Patient reported that his blood pressures have been much better but he has not been taking the losartan and coreg for about a month.  At last visit we discussed holding the coreg only when the Heart rate was less than 60.   Patient's wife is a Charity fundraiser. Patient has a follow up appointment with his primary cardiologist today to discuss his blood pressures further. Patient wife has kept a detailed log of patient blood pressures over the last month.   ? ?  ?Vital Signs   ?Date of Measurement _21___/_MAR____/__2023_____ ?DD/MMM/YYYY  ?Height/Hight Unit and Weight/Weight Units _____69__________ inches ?_____75__________ Denny Peon  ?Pulse Rate ___65____________ Beats per Minute  ?Systolic Blood Pressure ____123_________ ?  ?Diastolic Blood Pressure _____69__________  ?Blood Pressure Position ____Sitting____________  ? ? ?Outpatient Encounter Medications as of 11/29/2021  ?Medication Sig  ? albuterol (VENTOLIN HFA) 108 (90 Base) MCG/ACT inhaler Inhale 2 puffs into the lungs every 6 (six) hours as needed for wheezing or shortness of breath. (Patient not taking: Reported on 11/09/2021)  ? aspirin 81 MG EC tablet Take 1 tablet (81 mg total) by mouth daily. Swallow whole.  ? carvedilol (COREG) 3.125 MG tablet Take 1 tablet (3.125 mg total) by mouth 2 (two) times daily. (Patient not taking: Reported on 11/09/2021)  ? cholecalciferol (VITAMIN D3) 25 MCG (1000 UT) tablet Take 1,000 Units by mouth daily.  ? cinacalcet (SENSIPAR) 30 MG tablet Take 1 tablet (30 mg total) by mouth daily with breakfast.  ? clopidogrel (PLAVIX) 75 MG tablet Take 4 tablets (300mg  loading dose) by mouth once approximately 12 hours after last dose of Brilinta, then decrease to 1 tablet daily  thereafter  ? losartan (COZAAR) 25 MG tablet Take 1 tablet (25 mg total) by mouth daily. (Patient not taking: Reported on 11/09/2021)  ? nitroGLYCERIN (NITROSTAT) 0.4 MG SL tablet Place 1 tablet (0.4 mg total) under the tongue every 5 (five) minutes x 3 doses as needed for chest pain.  ? tamsulosin (FLOMAX) 0.4 MG CAPS capsule Take 1 capsule (0.4 mg total) by mouth daily.  ? venlafaxine XR (EFFEXOR-XR) 150 MG 24 hr capsule Take 1 capsule (150 mg total) by mouth daily.  ? ?No facility-administered encounter medications on file as of 11/29/2021.  ?  ? ?Were there any Adverse Events Experienced? Yes []    No[x]   ?If yes, complete the following Description of event: _____________________________________ ? ? ? ? ?_____________________________________ ?  ?Is this AE related to an injection site Reaction Yes []    No[]  ?If yes please provide information below ?Anatomical Location: ___________________ ?Laterality: ____________________________ ?Bruising: Yes []    No[]  ?Erythema: Yes []    No[]  ?Induration: Yes []    No[]  ?Lipodystrophy: Yes []    No[]  ?Necrosis: Yes []    No[]  ?Oedema: Yes []    No[]  ?Pain: Yes []    No[]  ?Pallor/Pigment Change: Yes []    No[]  ?Phlebitis: Yes []    No[]  ?Purtitus:   Yes []    No[]  ?Rash: Yes []    No[]  ?Tenderness:  Yes []    No[]  ?Ulceration: Yes []    No[]  ?Warmth: Yes []    No[]  ?Additional Symptoms: __________________ ? ?_____________________________________  ?Was the Adverse Event serious Yes []    No[]   ?Serious Criteria  ? ?_____________________________________  ?Start Date ____/_____/_______ ? DD/MMM/YYYY  ?  End Date ____/_____/_______ ? DD/MMM/YYYY  ?Outcome []  Resolved/Recovered ?[]  Not Recovered/Not Resolved ?[]  Fatal  ?Severity  []  Mild ?[]  Moderate ?[]  Severe  ?Relationship to Study Treatment  ? ? ?____________________________________________  ?Action Treatment with Study Treatment []  No Action Taken ?[] Dose Interrupted ?[]  Drug Withdrawal ?[]  Not Applicable ?[]  Unknown  ?Was a concomitant or  additional treatment given due to this adverse event Yes []    No[]  (if yes, add meds/procedures to list)  ?If AE led to study discontinuation select Yes Yes []    No[]   ? ? ?Were any Surgical/Medical Procedures reported since signing of Consent Yes []    No[x]   ? Surgery/Procedure: _____________________ ? ?Indication: ___________________________ ? ?Start Date: ____/_____/_______ ?                    DD/MMM/YYYY ?Ongoing: Yes []    No[]  ? ?End Date: ____/_____/_______ ?                   DD/MMM/YYYY ?  ?Has the subject been hospitalized since Qualifying Hospitalization Yes []    No[x]   ? Elective []  Planned []  Unplanned []  ?Date of Admission: ____/_____/_______ ?                        DD/MMM/YYYY ?Reason for Admission:__________________ ?____________________________________ ?_____________________________________ ? ?Was Subject Discharged: Yes []    No[]  ?Date of Discharge: ____/_____/_______ ?                               DD/MMM/YYYY ?Discharge Disposition: _________________ ? ? ? ?Total number of days in intensive care unit or coronary care unit:________________ ?  ?Has the subject had any emergency room visits (for less than 24 hours) Yes []    No[x]   ? Date of Visit: ____/_____/_______ ?                        DD/MMM/YYYY ?Was Visit for an AE: Yes []    No[]  ?Reason for Visit:__________________ ?____________________________________ ?_____________________________________ ?Was a concomitant or additional treatment given due to this visit: Yes []    No[]  ?  ?Has the subject had any other clinical or healthcare professional consultations or visits Yes [x]    No[]   ? Professional Type: __Primary Care Physician__________________ ? ?Date of Visit: _01___/_MAR____/_2023______ ?                        DD/MMM/YYYY ?Was visit an Adverse Event: Yes []    No[x]  ?Reason for Visit: __Follow-up appt___________________ ? ? ? ? ?Was a concomitant or additional treatment given due to this visit: Yes []    No[x]   ? ? ?Form Based on: CFR  Completion Guidelines Trial: Number 2.0 Template Version 4.0, Template Effective Date: 25-Jul-2019  ?

## 2021-11-29 NOTE — Patient Instructions (Signed)
Medication Instructions:  °Your physician recommends that you continue on your current medications as directed. Please refer to the Current Medication list given to you today. ° °*If you need a refill on your cardiac medications before your next appointment, please call your pharmacy* ° ° °Lab Work: °NONE °If you have labs (blood work) drawn today and your tests are completely normal, you will receive your results only by: °MyChart Message (if you have MyChart) OR °A paper copy in the mail °If you have any lab test that is abnormal or we need to change your treatment, we will call you to review the results. ° ° °Testing/Procedures: °NONE ° ° °Follow-Up: °At [facility], you and your health needs are our priority.  As part of our continuing mission to provide you with exceptional heart care, we have created designated Provider Care Teams.  These Care Teams include your primary Cardiologist (physician) and Advanced Practice Providers (APPs -  Physician Assistants and Nurse Practitioners) who all work together to provide you with the care you need, when you need it. ° °We recommend signing up for the patient portal called "MyChart".  Sign up information is provided on this After Visit Summary.  MyChart is used to connect with patients for Virtual Visits (Telemedicine).  Patients are able to view lab/test results, encounter notes, upcoming appointments, etc.  Non-urgent messages can be sent to your provider as well.   °To learn more about what you can do with MyChart, go to [email or web address].   ° °

## 2021-11-29 NOTE — Progress Notes (Signed)
? ?Cardiology Office Note ? ? ?Date:  11/29/2021  ? ?ID:  Billy Hunt, DOB 1945-11-02, MRN [id number] ? ?PCP:  Lindell Spar, MD  ?Cardiologist:   Dorris Carnes, MD  ? ?F?U of CAD   ? ?  ?History of Present Illness: ?Billy Hunt is a 76 y.o. male with a history of CAD (NSTEMI in 06/2021), Cath showed multivessel dz.   Pt underwent PCI/DES to an occluded OM1 and then PCI/DES to LAD (staged intervention)    Pt also with a hx of HTN, hyperparthyoidism, BPH, DVT/PE, HL, gout, neuropathy, COPD    ? ?The pt was last seen in clinic by T Harriet Pho on 10/2021  At that time complained of SOB, "foggy" sensations since NSTEMI     Started on Lequivo ? ?Sinc seen he says his stamina has increased some  He has held carvedilol and losartan   ?   Denies CP   Breathing is fair     ?BP has been low normal at home   has held some of meds    O2 sats above 90   ? ?Current Meds  ?Medication Sig  ? albuterol (VENTOLIN HFA) 108 (90 Base) MCG/ACT inhaler Inhale 2 puffs into the lungs every 6 (six) hours as needed for wheezing or shortness of breath.  ? aspirin 81 MG EC tablet Take 1 tablet (81 mg total) by mouth daily. Swallow whole.  ? cholecalciferol (VITAMIN D3) 25 MCG (1000 UT) tablet Take 1,000 Units by mouth daily.  ? cinacalcet (SENSIPAR) 30 MG tablet Take 1 tablet (30 mg total) by mouth daily with breakfast.  ? clopidogrel (PLAVIX) 75 MG tablet Take 4 tablets (300mg  loading dose) by mouth once approximately 12 hours after last dose of Brilinta, then decrease to 1 tablet daily thereafter  ? nitroGLYCERIN (NITROSTAT) 0.4 MG SL tablet Place 1 tablet (0.4 mg total) under the tongue every 5 (five) minutes x 3 doses as needed for chest pain.  ? tamsulosin (FLOMAX) 0.4 MG CAPS capsule Take 1 capsule (0.4 mg total) by mouth daily.  ? venlafaxine XR (EFFEXOR-XR) 150 MG 24 hr capsule Take 1 capsule (150 mg total) by mouth daily.  ? ? ? ?Allergies:   Sulfa antibiotics and Sulfamethoxazole  ? ?Past Medical History:  ?Diagnosis Date  ? Acute  coronary syndrome ([place]) 07/04/2021  ? Anxiety   ? BPH (benign prostatic hyperplasia)   ? Gout   ? Hypercholesterolemia   ? Hyperparathyroidism ([place])   ? Hypertension   ? Pulmonary emboli ([place])   ? Vitamin D deficiency   ? Vitamin D deficiency disease 10/07/2019  ? ? ?Past Surgical History:  ?Procedure Laterality Date  ? CORONARY STENT INTERVENTION N/A 07/04/2021  ? Procedure: CORONARY STENT INTERVENTION;  Surgeon: Nelva Bush, MD;  Location: [place] [facility];  Service: Cardiovascular;  Laterality: N/A;  ? CORONARY STENT INTERVENTION N/A 07/06/2021  ? Procedure: CORONARY STENT INTERVENTION;  Surgeon: Troy Sine, MD;  Location: [facility] [facility];  Service: Cardiovascular;  Laterality: N/A;  ? LEFT HEART CATH AND CORONARY ANGIOGRAPHY N/A 07/04/2021  ? Procedure: LEFT HEART CATH AND CORONARY ANGIOGRAPHY;  Surgeon: Nelva Bush, MD;  Location: [place];  Service: Cardiovascular;  Laterality: N/A;  ? PERIPHERAL VASCULAR THROMBECTOMY    ? TRANSURETHRAL RESECTION OF PROSTATE    ? VASECTOMY    ? ? ? ?Social History:  The patient  reports that he quit smoking about 50 years ago. His smoking use included cigarettes. He has  a 10.00 pack-year smoking history. He has never used smokeless tobacco. He reports current drug use. Drug: Marijuana. He reports that he does not drink alcohol.  ? ?Family History:  The patient's family history includes Alcohol abuse in his paternal grandfather; Alzheimer's disease in his mother; Breast cancer in his mother; Cancer in his father and paternal grandmother; Deep vein thrombosis in his brother; Heart disease in his brother; Hypertension in his father; Stroke in his mother.  ? ? ?ROS:  Please see the history of present illness. All other systems are reviewed and  Negative to the above problem except as noted.  ? ? ?PHYSICAL EXAM: ?VS:  BP 126/75   Pulse 73   Ht 5\' 9"  (1.753 m)   Wt 169 lb (76.7 kg)   SpO2 97%   BMI 24.96 kg/m?   ?GEN: Well nourished, well developed,  in no acute distress  ?HEENT: normal  ?Neck: no JVD, carotid bruits, ?Cardiac: RRR; no murmurs,  No LE edema  ?Respiratory:  clear to auscultation bilaterally, normal work of breathing ?GI: soft, nontender, nondistended, + BS  No hepatomegaly  ?MS: no deformity Moving all extremities   ?Skin: warm and dry, no rash ?Neuro:  Strength and sensation are intact ?Psych: euthymic mood, full affect ? ? ?EKG:  EKG is not ordered today. ? ?CATH 06/2021 ?  ?  Ost LM to Mid LM lesion is 20% stenosed. ?  Prox LAD lesion is 30% stenosed. ?  Mid LAD-2 lesion is 70% stenosed. ?  Prox Cx to Mid Cx lesion is 40% stenosed. ?  1st Diag-1 lesion is 30% stenosed. ?  1st Diag-2 lesion is 70% stenosed. ?  2nd Diag lesion is 80% stenosed. ?  1st Mrg lesion is 70% stenosed. ?  Mid LAD-1 lesion is 90% stenosed. ?  Mid LAD-3 lesion is 60% stenosed. ?  Non-stenotic 2nd Mrg lesion was previously treated. ?  A drug-eluting stent was successfully placed. ?  Post intervention, there is a 0% residual stenosis. ?  Post intervention, there is a 0% residual stenosis. ?  Post intervention, there is a 0% residual stenosis. ?  ?Successful staged PCI to the LAD in this patient who is 2 days status post initial presentation with ACS and total occlusion of the circumflex marginal vessel which was stented. ?  ?The LAD had diffuse disease of 90, 70 % and 60% between the 1st and second diagonal vessel which was successfully stented with a 2.5 x 38 mm Onyx frontier DES stent postdilated to 2.75 mm with the stenosis being reduced to 0%. ?  ?RECOMMENDATION: ?DAPT for minimum of 12 months in this patient status post ACS 2 days previously with successful stenting of the circumflex marginal vessel, and is successful staged PCI to diffusely diseased mid LAD today.  Medical therapy for concomitant RCA disease.  Aggressive lipid-lowering therapy with target LDL less than 70 and optimal blood pressure control. ? Diagnostic ?Dominance: Right ?Intervention ?  ?  ?  ?  ?PCI  07/04/2021 Dr. Saunders Revel ?CORONARY STENT INTERVENTION  ?LEFT HEART CATH AND CORONARY ANGIOGRAPHY  ?  ?Conclusion ?  ?Conclusions: ?Significant multivessel coronary artery disease, as detailed below.  Culprit lesion for the patient's NSTEMI is likely occluded ostial/proximal OM2 branch.  There is also severe mid LAD and D1 disease. ?Low normal left ventricular filling pressure (LVEDP ~5 mmHg). ?Successful PCI to OM2 using Onyx Frontier 2.5 x 26 mm drug-eluting stent with 0% residual stenosis and TIMI-3 flow. ?  ?Recommendations: ?Continue cangrelor  infusion for 2 hours following ticagrelor load. ?Aggressive secondary prevention. ?Wean IV nitroglycerin as tolerated. ?Consider staged PCI to mid LAD; timing (during this admission versus as an outpatient in the next few weeks) to be determined based on symptoms. ?  ?  ?Diagnostic ?Dominance: Right ?Intervention ?  ? ?  ?  ?Echocardiogram 07/04/2021 ?1. Left ventricular ejection fraction, by estimation, is 60 to 65%. The  ?left ventricle has normal function. The left ventricle has no regional  ?wall motion abnormalities. The left ventricular internal cavity size was  ?mildly dilated. There is mild left  ?ventricular hypertrophy. Left ventricular diastolic parameters are  ?consistent with Grade I diastolic dysfunction (impaired relaxation).  ? 2. Right ventricular systolic function is normal. The right ventricular  ?size is normal. There is mildly elevated pulmonary artery systolic  ?pressure.  ? 3. Left atrial size was mildly dilated.  ? 4. The mitral valve is normal in structure. Mild mitral valve  ?regurgitation.  ? 5. The aortic valve is tricuspid. Aortic valve regurgitation is mild.  ?Mild aortic valve sclerosis is present, with no evidence of aortic valve  ?stenosis.  ? ?Comparison(s): The left ventricular function is unchanged.  ?  ?  ?  ? ? ?Lipid Panel ?   ?Component Value Date/Time  ? CHOL 97 (L) 09/28/2021 0844  ? TRIG 54 09/28/2021 0844  ? HDL 49 09/28/2021 0844  ?  CHOLHDL 2.0 09/28/2021 0844  ? CHOLHDL 4.4 07/05/2021 0320  ? VLDL 9 07/05/2021 0320  ? [place] 35 09/28/2021 0844  ? ?  ? ?Wt Readings from Last 3 Encounters:  ?11/29/21 169 lb (76.7 kg)  ?11/29/21 165 l

## 2021-12-01 ENCOUNTER — Encounter: Payer: Self-pay | Admitting: *Deleted

## 2021-12-01 ENCOUNTER — Encounter: Payer: Self-pay | Admitting: Family Medicine

## 2021-12-01 ENCOUNTER — Other Ambulatory Visit: Payer: Self-pay

## 2021-12-01 ENCOUNTER — Ambulatory Visit (INDEPENDENT_AMBULATORY_CARE_PROVIDER_SITE_OTHER): Payer: Medicare Other | Admitting: Family Medicine

## 2021-12-01 VITALS — BP 132/72 | HR 66 | Ht 69.0 in | Wt 169.1 lb

## 2021-12-01 DIAGNOSIS — Z006 Encounter for examination for normal comparison and control in clinical research program: Secondary | ICD-10-CM

## 2021-12-01 DIAGNOSIS — I10 Essential (primary) hypertension: Secondary | ICD-10-CM | POA: Diagnosis not present

## 2021-12-01 DIAGNOSIS — I25110 Atherosclerotic heart disease of native coronary artery with unstable angina pectoris: Secondary | ICD-10-CM

## 2021-12-01 DIAGNOSIS — N3000 Acute cystitis without hematuria: Secondary | ICD-10-CM | POA: Insufficient documentation

## 2021-12-01 DIAGNOSIS — R319 Hematuria, unspecified: Secondary | ICD-10-CM

## 2021-12-01 DIAGNOSIS — N3001 Acute cystitis with hematuria: Secondary | ICD-10-CM

## 2021-12-01 LAB — POCT URINALYSIS DIP (CLINITEK)
Glucose, UA: NEGATIVE mg/dL
Nitrite, UA: POSITIVE — AB
POC PROTEIN,UA: >=300 — AB
Spec Grav, UA: 1.02 (ref 1.010–1.025)
Urobilinogen, UA: 4 E.U./dL — AB
pH, UA: 5.5 (ref 5.0–8.0)

## 2021-12-01 NOTE — Patient Instructions (Addendum)
F/u with dr Posey Pronto as before, call if you need to be seen sooner ? ?You appear to have a urinary tract infection and nitrofurantoin, an antibiotic is prescribed ? ?Please ensue you drink a lot of water , at least 64 oz/ day, and empty often ? ?Stat CBC and diff today, chem 7 and EGFR, we will call with result, if your blood count is stable then resume the plavix and aspirin please, we will clarify at the time we call ? ?If you start passing a lot of blood in your urine, please go to the ED ? ?Need to keep appointment tomorrow with Urology, urinary tract infections in men are not common, and blood in the urine is not normal ? ?Thanks for choosing [facility], we consider it a privelige to serve you. ? ?

## 2021-12-01 NOTE — Research (Signed)
[place] ? ?Patients wife Marjean Donna contacted me via email this morning around [phone] am stating that Mr. Heinbaugh had complaints of "in urine and some clots (one episode at 1pm)..his urine flow was good and color of urine was light Hawaiian punch color.  I held his aspirin 81mg  dose at 6pm. He continued drinking fluids till bedtime..he had no fever, chills.no urgency or straining..he felt he was emptying his bladder as well. ?During night, he voided 4 to 5 times with clots.. The color of urine is now bright red. He is resting right now". ? ?I recommended that the patient would need to be seen today by his PCP since he had not had any Urology follow-up since his TURP procedure in 2017 at [facility].  Gave recommendations for Urology offices in [place] and [place].  ? ?The patients wife was able to get a Urology Visit for 12/02/2021 with Dr 12/04/2021 in the [place] office.  Patient was also able to get an appt with his PCP today to be seen.  He was dx with UTI and treated with Antibiotics.  Patient will keep urology appointment for tomorrow.  ? ? ?[place], RN BSN ?[place] ?[facility] ?Direct Line: [phone]  ?

## 2021-12-02 ENCOUNTER — Telehealth: Payer: Self-pay

## 2021-12-02 ENCOUNTER — Ambulatory Visit (INDEPENDENT_AMBULATORY_CARE_PROVIDER_SITE_OTHER): Payer: Medicare Other | Admitting: Urology

## 2021-12-02 ENCOUNTER — Encounter: Payer: Self-pay | Admitting: Urology

## 2021-12-02 VITALS — BP 164/85 | HR 67 | Ht 69.0 in | Wt 165.0 lb

## 2021-12-02 DIAGNOSIS — R339 Retention of urine, unspecified: Secondary | ICD-10-CM

## 2021-12-02 DIAGNOSIS — R972 Elevated prostate specific antigen [PSA]: Secondary | ICD-10-CM

## 2021-12-02 DIAGNOSIS — R310 Gross hematuria: Secondary | ICD-10-CM | POA: Diagnosis not present

## 2021-12-02 DIAGNOSIS — N138 Other obstructive and reflux uropathy: Secondary | ICD-10-CM | POA: Diagnosis not present

## 2021-12-02 DIAGNOSIS — N401 Enlarged prostate with lower urinary tract symptoms: Secondary | ICD-10-CM | POA: Diagnosis not present

## 2021-12-02 LAB — CBC WITH DIFFERENTIAL/PLATELET
Basophils Absolute: 0.1 10*3/uL (ref 0.0–0.2)
Basos: 1 %
EOS (ABSOLUTE): 0.1 10*3/uL (ref 0.0–0.4)
Eos: 2 %
Hematocrit: 42.7 % (ref 37.5–51.0)
Hemoglobin: 14.5 g/dL (ref 13.0–17.7)
Immature Grans (Abs): 0 10*3/uL (ref 0.0–0.1)
Immature Granulocytes: 0 %
Lymphocytes Absolute: 1.7 10*3/uL (ref 0.7–3.1)
Lymphs: 27 %
MCH: 31.4 pg (ref 26.6–33.0)
MCHC: 34 g/dL (ref 31.5–35.7)
MCV: 92 fL (ref 79–97)
Monocytes Absolute: 0.5 10*3/uL (ref 0.1–0.9)
Monocytes: 8 %
Neutrophils Absolute: 4 10*3/uL (ref 1.4–7.0)
Neutrophils: 62 %
Platelets: 192 10*3/uL (ref 150–450)
RBC: 4.62 x10E6/uL (ref 4.14–5.80)
RDW: 12.3 % (ref 11.6–15.4)
WBC: 6.4 10*3/uL (ref 3.4–10.8)

## 2021-12-02 LAB — MICROSCOPIC EXAMINATION
Bacteria, UA: NONE SEEN
Epithelial Cells (non renal): NONE SEEN /hpf (ref 0–10)
Renal Epithel, UA: NONE SEEN /hpf
WBC, UA: NONE SEEN /hpf (ref 0–5)

## 2021-12-02 LAB — URINALYSIS, ROUTINE W REFLEX MICROSCOPIC
Bilirubin, UA: NEGATIVE
Glucose, UA: NEGATIVE
Ketones, UA: NEGATIVE
Leukocytes,UA: NEGATIVE
Nitrite, UA: NEGATIVE
Specific Gravity, UA: 1.01 (ref 1.005–1.030)
Urobilinogen, Ur: 0.2 mg/dL (ref 0.2–1.0)
pH, UA: 6.5 (ref 5.0–7.5)

## 2021-12-02 LAB — BMP8+EGFR
BUN/Creatinine Ratio: 17 (ref 10–24)
BUN: 17 mg/dL (ref 8–27)
CO2: 23 mmol/L (ref 20–29)
Calcium: 10.2 mg/dL (ref 8.6–10.2)
Chloride: 102 mmol/L (ref 96–106)
Creatinine, Ser: 1 mg/dL (ref 0.76–1.27)
Glucose: 94 mg/dL (ref 70–99)
Potassium: 4.9 mmol/L (ref 3.5–5.2)
Sodium: 137 mmol/L (ref 134–144)
eGFR: 79 mL/min/{1.73_m2} (ref >59)

## 2021-12-02 LAB — BLADDER SCAN AMB NON-IMAGING: Scan Result: 331

## 2021-12-02 MED ORDER — NITROFURANTOIN MONOHYD MACRO 100 MG PO CAPS: 100.0000 mg | ORAL_CAPSULE | Freq: Two times a day (BID) | ORAL | 0 refills | Status: DC

## 2021-12-02 NOTE — Progress Notes (Signed)
post void residual =318mL ?

## 2021-12-02 NOTE — Telephone Encounter (Signed)
Patient called was seen yesterday and no medicine was called into the pharmacy. ? ?nitrofurantoin, macrocrystal-monohydrate, (MACROBID) 100 MG capsule ? ?Pharmacy [facility] ?

## 2021-12-02 NOTE — Telephone Encounter (Signed)
Med sent in.

## 2021-12-02 NOTE — Progress Notes (Signed)
? ?Assessment: ?1. Gross hematuria   ?2. BPH with obstruction/lower urinary tract symptoms; s/p TURP 2017   ?3. Incomplete bladder emptying   ?4. Elevated PSA   ? ? ?Plan: ?I reviewed his recent lab results and notes from his PCP.  I also reviewed his prior urology notes from [facility]. ?Await culture results ?Continue antibiotics ?Return to office in 4 weeks with bladder scan ? ?Chief Complaint:  ?Chief Complaint  ?Patient presents with  ? Hematuria  ? ? ?History of Present Illness: ? ?Billy Hunt is a 76 y.o. year old male who is seen in consultation from Lindell Spar, MD for evaluation of gross hematuria.  He noted onset of gross hematuria with clots on 12/01/2021.  He did have associated frequency and some slight dysuria.  Urinalysis from 12/01/2021 showed large blood, large leukocytes and positive nitrite.  A urine culture is pending.  He has not started any antibiotics at this time.  A prescription for Macrobid was sent to his pharmacy.  He reports that his urine has visibly cleared today.  He reports no significant change in his symptoms prior to the onset of hematuria.  He has been managed with Flomax for a number of years.  No prior episodes of gross hematuria. ?IPSS = 14 today. ? ?He has a history of BPH with obstruction and is status post a TURP in 417 by Dr. Odis Luster at [facility]. ?He does have a history of urethral stricture disease undergoing dilation when in college.  He also has a history of elevated PSA and has undergone 2 prior negative prostate biopsies. ?PSA from 02/06/2013: 5.65 ? ? ?Past Medical History:  ?Past Medical History:  ?Diagnosis Date  ? Acute coronary syndrome ([place]) 07/04/2021  ? Anxiety   ? BPH (benign prostatic hyperplasia)   ? Gout   ? Hypercholesterolemia   ? Hyperparathyroidism ([place])   ? Hypertension   ? Pulmonary emboli ([place])   ? Vitamin D deficiency   ? Vitamin D deficiency disease 10/07/2019  ? ? ?Past Surgical History:  ?Past Surgical History:  ?Procedure  Laterality Date  ? CORONARY STENT INTERVENTION N/A 07/04/2021  ? Procedure: CORONARY STENT INTERVENTION;  Surgeon: Nelva Bush, MD;  Location: [place] [facility];  Service: Cardiovascular;  Laterality: N/A;  ? CORONARY STENT INTERVENTION N/A 07/06/2021  ? Procedure: CORONARY STENT INTERVENTION;  Surgeon: Troy Sine, MD;  Location: [place] [facility];  Service: Cardiovascular;  Laterality: N/A;  ? LEFT HEART CATH AND CORONARY ANGIOGRAPHY N/A 07/04/2021  ? Procedure: LEFT HEART CATH AND CORONARY ANGIOGRAPHY;  Surgeon: Nelva Bush, MD;  Location: [place] [facility];  Service: Cardiovascular;  Laterality: N/A;  ? PERIPHERAL VASCULAR THROMBECTOMY    ? TRANSURETHRAL RESECTION OF PROSTATE    ? VASECTOMY    ? ? ?Allergies:  ?Allergies  ?Allergen Reactions  ? Sulfa Antibiotics Hives  ? Sulfamethoxazole Rash  ? ? ?Family History:  ?Family History  ?Problem Relation Age of Onset  ? Alzheimer's disease Mother   ?     Vascular Dementia  ? Stroke Mother   ? Breast cancer Mother   ? Cancer Father   ?     Multiple Myeloma  ? Hypertension Father   ? Deep vein thrombosis Brother   ? Heart disease Brother   ? Cancer Paternal Grandmother   ? Alcohol abuse Paternal Grandfather   ? ? ?Social History:  ?Social History  ? ?Tobacco Use  ? Smoking status: Former  ?  Packs/day:  1.00  ?  Years: 10.00  ?  Pack years: 10.00  ?  Types: Cigarettes  ?  Quit date: 04/21/1971  ?  Years since quitting: 50.6  ? Smokeless tobacco: Never  ?Vaping Use  ? Vaping Use: Never used  ?Substance Use Topics  ? Alcohol use: No  ?  Comment: former  ? Drug use: Yes  ?  Types: Marijuana  ?  Comment: last use: 9/30  ? ? ?Review of symptoms:  ?Constitutional:  Negative for unexplained weight loss, night sweats, fever, chills ?ENT:  Negative for nose bleeds, sinus pain, painful swallowing ?CV:  Negative for chest pain, shortness of breath, exercise intolerance, palpitations, loss of consciousness ?Resp:  Negative for cough, wheezing, shortness of  breath ?GI:  Negative for nausea, vomiting, diarrhea, bloody stools ?GU:  Positives noted in HPI; otherwise negative for dysuria, urinary incontinence ?Neuro:  Negative for seizures, poor balance, limb weakness, slurred speech ?Psych:  Negative for lack of energy, depression, anxiety ?Endocrine:  Negative for polydipsia, polyuria, symptoms of hypoglycemia (dizziness, hunger, sweating) ?Hematologic:  Negative for anemia, purpura, petechia, prolonged or excessive bleeding, use of anticoagulants  ?Allergic:  Negative for difficulty breathing or choking as a result of exposure to anything; no shellfish allergy; no allergic response (rash/itch) to materials, foods ? ?Physical exam: ?BP (!) 164/85   Pulse 67   Ht 5' 9"  (1.753 m)   Wt 165 lb (74.8 kg)   BMI 24.37 kg/m?  ?GENERAL APPEARANCE:  Well appearing, well developed, well nourished, NAD ?HEENT: Atraumatic, Normocephalic, oropharynx clear. ?NECK: Supple without lymphadenopathy or thyromegaly. ?LUNGS: Clear to auscultation bilaterally. ?HEART: Regular Rate and Rhythm without murmurs, gallops, or rubs. ?ABDOMEN: Soft, non-tender, No Masses. ?EXTREMITIES: Moves all extremities well.  Without clubbing, cyanosis, or edema. ?NEUROLOGIC:  Alert and oriented x 3, normal gait, CN II-XII grossly intact.  ?MENTAL STATUS:  Appropriate. ?BACK:  Non-tender to palpation.  No CVAT ?SKIN:  Warm, dry and intact.   ?GU: ?Penis:  circumcised ?Meatus: Normal ?Scrotum: normal, no masses ?Testis: normal without masses bilateral ?Epididymis: normal ?Prostate: 60 g, NT, no nodules ?Rectum: Normal tone,  no masses or tenderness ? ? ?Results: ?U/A:  3-10 RBC ? ?PVR:  331 ml ?

## 2021-12-02 NOTE — Telephone Encounter (Signed)
Spoke with pt and RX was sent to [facility] ?

## 2021-12-03 LAB — URINE CULTURE: Organism ID, Bacteria: NO GROWTH

## 2021-12-04 ENCOUNTER — Encounter: Payer: Self-pay | Admitting: Family Medicine

## 2021-12-04 DIAGNOSIS — N3001 Acute cystitis with hematuria: Secondary | ICD-10-CM | POA: Insufficient documentation

## 2021-12-04 NOTE — Progress Notes (Signed)
? ?  Billy Hunt     MRN: [id number]      DOB: [birth year] ? ? ?HPI ?Billy Hunt is here with a 2 day h/o painless hematuria. Has path/o TURP, is on blood thinner due to non stemi in 06/2021 and has held med today due to new bleiding. ?Denies fever, chills or flank pain ? ?ROS ?Denies recent fever or chills. ?Denies sinus pressure, nasal congestion, ear pain or sore throat. ?Denies chest congestion, productive cough or wheezing. ?Denies chest pains, palpitations and leg swelling ?Denies abdominal pain, nausea, vomiting,diarrhea or constipation.   ? ?Denies joint pain, swelling and limitation in mobility. ?Denies headaches, seizures, numbness, or tingling. ?Denies depression, anxiety or insomnia. ?Denies skin break down or rash. ? ? ?PE ? ?BP 132/72   Pulse 66   Ht 5\' 9"  (1.753 m)   Wt 169 lb 1.9 oz (76.7 kg)   SpO2 96%   BMI 24.97 kg/m?  ? ?Patient alert and oriented and in no cardiopulmonary distress. ? ?HEENT: No facial asymmetry, EOMI,     Neck supple . ? ?Chest: Clear to auscultation bilaterally. ? ?CVS: S1, S2 no murmurs, no S3.Regular rate. ? ?ABD: Soft no renal angle os suprapubic tenderness ?Ext: No edema ? ?MS: Adequate ROM spine, shoulders, hips and knees. ? ?Skin: Intact, no ulcerations or rash noted. ? ?Psych: Good eye contact, normal affect. Memory intact not anxious or depressed appearing. ? ?CNS: CN 2-12 intact, power,  normal throughout.no focal deficits noted. ? ? ?Assessment & Plan ? ?Acute cystitis with hematuria ?Acute gross hematuria, abn CCUA, presume UTI , start antibiotic and follow closely. Has appt with Urology in am, h/o TURP ? ?HTN (hypertension) ?Controlled, no change in medication ? ? ?

## 2021-12-04 NOTE — Assessment & Plan Note (Signed)
Controlled, no change in medication  

## 2021-12-04 NOTE — Assessment & Plan Note (Signed)
Acute gross hematuria, abn CCUA, presume UTI , start antibiotic and follow closely. Has appt with Urology in am, h/o TURP ?

## 2021-12-05 NOTE — Research (Signed)
Are there any labs that are clinically significant?  Yes []  OR No[]  ? ?Is the patient eligible to continue enrollment in the study after screening visit?  ?Yes []   OR No[]   ? ? ? ? ? ? ? ? ?

## 2021-12-06 ENCOUNTER — Other Ambulatory Visit: Payer: Self-pay | Admitting: *Deleted

## 2021-12-06 ENCOUNTER — Other Ambulatory Visit: Payer: Self-pay | Admitting: Urology

## 2021-12-06 DIAGNOSIS — R310 Gross hematuria: Secondary | ICD-10-CM

## 2021-12-06 NOTE — Progress Notes (Signed)
Open in error

## 2021-12-09 ENCOUNTER — Telehealth: Payer: Self-pay

## 2021-12-09 ENCOUNTER — Ambulatory Visit (HOSPITAL_COMMUNITY)
Admission: RE | Admit: 2021-12-09 | Discharge: 2021-12-09 | Disposition: A | Discharge status: Home / Self Care | Payer: Medicare Other | Source: Ambulatory Visit | Attending: Urology | Admitting: Urology

## 2021-12-09 ENCOUNTER — Other Ambulatory Visit: Payer: Self-pay | Admitting: Urology

## 2021-12-09 DIAGNOSIS — K5730 Diverticulosis of large intestine without perforation or abscess without bleeding: Secondary | ICD-10-CM | POA: Diagnosis not present

## 2021-12-09 DIAGNOSIS — R310 Gross hematuria: Secondary | ICD-10-CM | POA: Insufficient documentation

## 2021-12-09 DIAGNOSIS — N281 Cyst of kidney, acquired: Secondary | ICD-10-CM | POA: Diagnosis not present

## 2021-12-09 DIAGNOSIS — N200 Calculus of kidney: Secondary | ICD-10-CM | POA: Diagnosis not present

## 2021-12-09 MED ORDER — ALPRAZOLAM 0.5 MG PO TABS: ORAL_TABLET | ORAL | 0 refills | Status: DC

## 2021-12-09 MED ORDER — IOHEXOL 300 MG/ML  SOLN
100.0000 mL | Freq: Once | INTRAMUSCULAR | Status: AC | PRN
  Administered 2021-12-09: 100 mL via INTRAVENOUS

## 2021-12-09 NOTE — Telephone Encounter (Signed)
Ilene called on behalf of pt asking if there is anything the patient can take before his cysto on 4/18 to relax him?  Please advise. ?

## 2021-12-09 NOTE — Telephone Encounter (Signed)
Patient aware.

## 2021-12-12 ENCOUNTER — Encounter: Payer: Self-pay | Admitting: Urology

## 2021-12-19 ENCOUNTER — Telehealth: Payer: Self-pay | Admitting: Internal Medicine

## 2021-12-19 NOTE — Telephone Encounter (Signed)
Patient called in office about SW services. ?Patient states that he initially declined, but is not wanting to have them. ? ?Would like a call back at earliest convenience  ?

## 2021-12-19 NOTE — Chronic Care Management (AMB) (Signed)
?  Care Management  ? ?Note ? ?12/19/2021 ?Name: VISHAAL STROLLO MRN: [id number] DOB: [birth year] ? ?FARHAN JEAN is a 77 y.o. year old male who is a primary care patient of Anabel Halon, MD. I reached out to [name] by phone today offer care coordination services.  ? ?Mr. Brahm was given information about care management services today including:  ?Care management services include personalized support from designated clinical staff supervised by his physician, including individualized plan of care and coordination with other care providers ?24/7 contact phone numbers for assistance for urgent and routine care needs. ?The patient may stop care management services at any time by phone call to the office staff. ? ?Patient agreed to services and verbal consent obtained.  ? ?Follow up plan: ?Telephone appointment with care management team member scheduled for: 01/20/2022 ? ?Khaden Gater, CCMA ?Care Guide, Embedded Care Coordination ?[place]  Care Management  ?Direct Dial: [phone] ? ? ?

## 2021-12-21 DIAGNOSIS — R310 Gross hematuria: Secondary | ICD-10-CM | POA: Diagnosis not present

## 2021-12-27 ENCOUNTER — Ambulatory Visit (INDEPENDENT_AMBULATORY_CARE_PROVIDER_SITE_OTHER): Payer: Medicare Other | Admitting: Urology

## 2021-12-27 ENCOUNTER — Encounter: Payer: Self-pay | Admitting: Urology

## 2021-12-27 VITALS — BP 137/77 | HR 72 | Ht 69.0 in | Wt 165.0 lb

## 2021-12-27 DIAGNOSIS — R310 Gross hematuria: Secondary | ICD-10-CM | POA: Insufficient documentation

## 2021-12-27 DIAGNOSIS — R972 Elevated prostate specific antigen [PSA]: Secondary | ICD-10-CM | POA: Diagnosis not present

## 2021-12-27 DIAGNOSIS — N401 Enlarged prostate with lower urinary tract symptoms: Secondary | ICD-10-CM

## 2021-12-27 DIAGNOSIS — N138 Other obstructive and reflux uropathy: Secondary | ICD-10-CM | POA: Diagnosis not present

## 2021-12-27 DIAGNOSIS — R339 Retention of urine, unspecified: Secondary | ICD-10-CM

## 2021-12-27 LAB — URINALYSIS, ROUTINE W REFLEX MICROSCOPIC
Bilirubin, UA: NEGATIVE
Glucose, UA: NEGATIVE
Ketones, UA: NEGATIVE
Leukocytes,UA: NEGATIVE
Nitrite, UA: NEGATIVE
Protein,UA: NEGATIVE
Specific Gravity, UA: <1.005 — ABNORMAL LOW (ref 1.005–1.030)
Urobilinogen, Ur: 0.2 mg/dL (ref 0.2–1.0)
pH, UA: 6 (ref 5.0–7.5)

## 2021-12-27 LAB — MICROSCOPIC EXAMINATION
Bacteria, UA: NONE SEEN
Epithelial Cells (non renal): NONE SEEN /hpf (ref 0–10)
Renal Epithel, UA: NONE SEEN /hpf
WBC, UA: NONE SEEN /hpf (ref 0–5)

## 2021-12-27 MED ORDER — CIPROFLOXACIN HCL 500 MG PO TABS
500.0000 mg | ORAL_TABLET | Freq: Once | ORAL | Status: AC
  Administered 2021-12-27: 500 mg via ORAL

## 2021-12-27 NOTE — Progress Notes (Signed)
? ?Assessment: ?1. Gross hematuria   ?2. BPH with obstruction/lower urinary tract symptoms; s/p TURP 2017   ?3. Incomplete bladder emptying   ?4. Elevated PSA   ? ? ?Plan: ?I personally reviewed the CT study from 12/09/2021 which showed a small right renal calculus, no suspicious renal mass or obstruction. ?I discussed the cystoscopic findings with the patient today.  He does have evidence of regrowth of the prostate adenoma which is likely causing his lower urinary tract symptoms and incomplete emptying.  He also has increased vascularity in this area which is likely contributing to the gross hematuria. ?Urine cytology sent today. ?Cipro x1 following cystoscopy. ?Free and total PSA sent today. ?Continue tamsulosin ?Return to office in 1 month ? ? ?Chief Complaint:  ?Chief Complaint  ?Patient presents with  ? Hematuria  ? ? ?History of Present Illness: ? ?Billy Hunt is a 76 y.o. year old male who is seen for further evaluation of gross hematuria.  He noted onset of gross hematuria with clots on 12/01/2021.  He did have associated frequency and some slight dysuria.  Urinalysis from 12/01/2021 showed large blood, large leukocytes and positive nitrite.  A urine culture showed no growth.  His urine visibly cleared.  He reported no significant change in his symptoms prior to the onset of hematuria.  He has been managed with Flomax for a number of years.  No prior episodes of gross hematuria. ?IPSS = 14. ?PVR = 331 ml. ? ?He has a history of BPH with obstruction and is status post a TURP in 4/17 by Dr. Odis Luster at [facility]. ?He does have a history of urethral stricture disease undergoing dilation when in college.  He also has a history of elevated PSA and has undergone 2 prior negative prostate biopsies. ?PSA from 02/06/2013: 5.65 ? ?CT hematuria protocol from 12/09/2021 demonstrated a punctate nonobstructive calculus in the superior pole of the right kidney, no renal masses or filling defects, and no evidence of  obstruction. ? ?He presents today for cystoscopy. ?No further gross hematuria.  No dysuria or flank pain.  He continues on tamsulosin. ?IPSS = 8 today. ? ?Portions of the above documentation were copied from a prior visit for review purposes only. ? ? ?Past Medical History:  ?Past Medical History:  ?Diagnosis Date  ? Acute coronary syndrome ([place]) 07/04/2021  ? Anxiety   ? BPH (benign prostatic hyperplasia)   ? Gout   ? Hypercholesterolemia   ? Hyperparathyroidism ([place])   ? Hypertension   ? Pulmonary emboli ([place])   ? Vitamin D deficiency   ? Vitamin D deficiency disease 10/07/2019  ? ? ?Past Surgical History:  ?Past Surgical History:  ?Procedure Laterality Date  ? CORONARY STENT INTERVENTION N/A 07/04/2021  ? Procedure: CORONARY STENT INTERVENTION;  Surgeon: Nelva Bush, MD;  Location: [place] [facility];  Service: Cardiovascular;  Laterality: N/A;  ? CORONARY STENT INTERVENTION N/A 07/06/2021  ? Procedure: CORONARY STENT INTERVENTION;  Surgeon: Troy Sine, MD;  Location: [place] [facility];  Service: Cardiovascular;  Laterality: N/A;  ? LEFT HEART CATH AND CORONARY ANGIOGRAPHY N/A 07/04/2021  ? Procedure: LEFT HEART CATH AND CORONARY ANGIOGRAPHY;  Surgeon: Nelva Bush, MD;  Location: [place] [facility];  Service: Cardiovascular;  Laterality: N/A;  ? PERIPHERAL VASCULAR THROMBECTOMY    ? TRANSURETHRAL RESECTION OF PROSTATE    ? VASECTOMY    ? ? ?Allergies:  ?Allergies  ?Allergen Reactions  ? Sulfa Antibiotics Hives  ? Sulfamethoxazole Rash  ? ? ?  Family History:  ?Family History  ?Problem Relation Age of Onset  ? Alzheimer's disease Mother   ?     Vascular Dementia  ? Stroke Mother   ? Breast cancer Mother   ? Cancer Father   ?     Multiple Myeloma  ? Hypertension Father   ? Deep vein thrombosis Brother   ? Heart disease Brother   ? Cancer Paternal Grandmother   ? Alcohol abuse Paternal Grandfather   ? ? ?Social History:  ?Social History  ? ?Tobacco Use  ? Smoking status: Former  ?  Packs/day: 1.00  ?   Years: 10.00  ?  Pack years: 10.00  ?  Types: Cigarettes  ?  Quit date: 04/21/1971  ?  Years since quitting: 50.7  ? Smokeless tobacco: Never  ?Vaping Use  ? Vaping Use: Never used  ?Substance Use Topics  ? Alcohol use: No  ?  Comment: former  ? Drug use: Yes  ?  Types: Marijuana  ?  Comment: last use: 9/30  ? ? ?ROS: ?Constitutional:  Negative for fever, chills, weight loss ?CV: Negative for chest pain, previous MI, hypertension ?Respiratory:  Negative for shortness of breath, wheezing, sleep apnea, frequent cough ?GI:  Negative for nausea, vomiting, bloody stool, GERD ? ?Physical exam: ?BP 137/77   Pulse 72   Ht 5' 9" (1.753 m)   Wt 165 lb (74.8 kg)   BMI 24.37 kg/m?  ?GENERAL APPEARANCE:  Well appearing, well developed, well nourished, NAD ?HEENT:  Atraumatic, normocephalic, oropharynx clear ?NECK:  Supple without lymphadenopathy or thyromegaly ?ABDOMEN:  Soft, non-tender, no masses ?EXTREMITIES:  Moves all extremities well, without clubbing, cyanosis, or edema ?NEUROLOGIC:  Alert and oriented x 3, normal gait, CN II-XII grossly intact ?MENTAL STATUS:  appropriate ?BACK:  Non-tender to palpation, No CVAT ?SKIN:  Warm, dry, and intact ? ? ?Results: ?U/A:  0-2 RBC ? ?Procedure:  Flexible Cystourethroscopy ? ?Pre-operative Diagnosis: Gross hematuria ? ?Post-operative Diagnosis: Gross hematuria ? ?Anesthesia:  local with lidocaine jelly ? ?Surgical Narrative: ? ?After appropriate informed consent was obtained, the patient was prepped and draped in the usual sterile fashion in the supine position.  The patient was correctly identified and the proper procedure delineated prior to proceeding.  Sterile lidocaine gel was instilled in the urethra. ?The flexible cystoscope was introduced without difficulty. ? ?Findings: ? ?Anterior urethra: Normal ? ?Posterior urethra:  Post TUR, regrowth of adenoma at bladder neck with chronic inflammatory changes, increased vascularity ? ?Bladder:  trabeculations, cellules, no  papillary lesions ? ?Ureteral orifices: normal ? ?Additional findings: none ? ?Saline bladder wash for cytology was performed.   ? ?The cystoscope was then removed.  The patient tolerated the procedure well. ? ? ?

## 2021-12-28 LAB — PSA, TOTAL AND FREE
PSA, Free Pct: 24.9 %
PSA, Free: 1.89 ng/mL
Prostate Specific Ag, Serum: 7.6 ng/mL — ABNORMAL HIGH (ref 0.0–4.0)

## 2021-12-29 ENCOUNTER — Ambulatory Visit: Payer: Medicare Other | Admitting: Urology

## 2021-12-29 ENCOUNTER — Encounter: Payer: Self-pay | Admitting: Urology

## 2022-01-09 ENCOUNTER — Other Ambulatory Visit: Payer: Self-pay | Admitting: Urology

## 2022-01-20 ENCOUNTER — Ambulatory Visit: Payer: Medicare Other | Admitting: Licensed Clinical Social Worker

## 2022-01-20 DIAGNOSIS — E210 Primary hyperparathyroidism: Secondary | ICD-10-CM

## 2022-01-20 DIAGNOSIS — F411 Generalized anxiety disorder: Secondary | ICD-10-CM

## 2022-01-20 DIAGNOSIS — E782 Mixed hyperlipidemia: Secondary | ICD-10-CM

## 2022-01-20 DIAGNOSIS — N401 Enlarged prostate with lower urinary tract symptoms: Secondary | ICD-10-CM

## 2022-01-20 DIAGNOSIS — E559 Vitamin D deficiency, unspecified: Secondary | ICD-10-CM

## 2022-01-20 DIAGNOSIS — I10 Essential (primary) hypertension: Secondary | ICD-10-CM

## 2022-01-20 DIAGNOSIS — I25110 Atherosclerotic heart disease of native coronary artery with unstable angina pectoris: Secondary | ICD-10-CM

## 2022-01-20 DIAGNOSIS — Z86711 Personal history of pulmonary embolism: Secondary | ICD-10-CM

## 2022-01-20 NOTE — Chronic Care Management (AMB) (Signed)
Care Management ?Clinical Social Work Note ? ?01/20/2022 ?Name: Billy Hunt MRN: [id number] DOB: [birth year] ? ?Billy Hunt is a 76 y.o. year old male who is a primary care patient of Billy Halon, MD.  The Care Management team was consulted for assistance with chronic disease management and coordination needs. ? ?Engaged with patient and engaged with Billy Hunt, significant other, by telephone for initial visit in response to provider referral for social work chronic care management and care coordination services ? ?Consent to Services:  ?[name] was given information about Care Management services today including:  ?Care Management services includes personalized support from designated clinical staff supervised by his physician, including individualized plan of care and coordination with other care providers ?24/7 contact phone numbers for assistance for urgent and routine care needs. ?The patient may stop case management services at any time by phone call to the office staff. ? ?Patient agreed to services and consent obtained.  ? ?Assessment: Review of patient past medical history, allergies, medications, and health status, including review of relevant consultants reports was performed today as part of a comprehensive evaluation and provision of chronic care management and care coordination services. ? ?SDOH (Social Determinants of Health) assessments and interventions performed:  ?SDOH Interventions   ? ?Flowsheet Row Most Recent Value  ?SDOH Interventions   ?Stress Interventions Provide Counseling  [has no key stress issues]  ? ?Coping Strategies:  Needs to discuss strategies with medical providers for managing medical conditions of client  ?  ? ?Advanced Directives Status: See Vynca application for related entries. ? ?Care Plan ? ?Allergies  ?Allergen Reactions  ? Sulfa Antibiotics Hives  ? Sulfamethoxazole Rash  ? ? ?Outpatient Encounter Medications as of 01/20/2022  ?Medication Sig Note  ?  albuterol (VENTOLIN HFA) 108 (90 Base) MCG/ACT inhaler Inhale 2 puffs into the lungs every 6 (six) hours as needed for wheezing or shortness of breath.   ? ALPRAZolam (XANAX) 0.5 MG tablet Take 1 tablet by mouth 1 hour prior to procedure   ? aspirin 81 MG EC tablet Take 1 tablet (81 mg total) by mouth daily. Swallow whole.   ? carvedilol (COREG) 3.125 MG tablet Take 1 tablet (3.125 mg total) by mouth 2 (two) times daily. 11/29/2021: HOLD since 11/02/21  ? cholecalciferol (VITAMIN D3) 25 MCG (1000 UT) tablet Take 1,000 Units by mouth daily.   ? cinacalcet (SENSIPAR) 30 MG tablet Take 1 tablet (30 mg total) by mouth daily with breakfast.   ? clopidogrel (PLAVIX) 75 MG tablet Take 4 tablets (300mg  loading dose) by mouth once approximately 12 hours after last dose of Brilinta, then decrease to 1 tablet daily thereafter   ? losartan (COZAAR) 25 MG tablet Take 1 tablet (25 mg total) by mouth daily. 11/29/2021: HOLD since 11/02/2021  ? nitroGLYCERIN (NITROSTAT) 0.4 MG SL tablet Place 1 tablet (0.4 mg total) under the tongue every 5 (five) minutes x 3 doses as needed for chest pain.   ? tamsulosin (FLOMAX) 0.4 MG CAPS capsule Take 1 capsule (0.4 mg total) by mouth daily.   ? venlafaxine XR (EFFEXOR-XR) 150 MG 24 hr capsule Take 1 capsule (150 mg total) by mouth daily.   ? ?No facility-administered encounter medications on file as of 01/20/2022.  ? ? ?Patient Active Problem List  ? Diagnosis Date Noted  ? Gross hematuria 12/27/2021  ? Incomplete bladder emptying 12/27/2021  ? Acute cystitis with hematuria 12/04/2021  ? Chronic cough 07/21/2021  ? Coronary artery disease involving native coronary  artery of native heart with unstable angina pectoris (HCC)   ? NSTEMI (non-ST elevated myocardial infarction) (HCC) 07/04/2021  ? GAD (generalized anxiety disorder) 06/01/2021  ? Idiopathic peripheral neuropathy 06/01/2021  ? BPH with obstruction/lower urinary tract symptoms 10/07/2019  ? Hyperparathyroidism, primary (HCC) 11/20/2018  ?  Osteopenia 11/20/2018  ? Anterior urethral stricture 06/22/2015  ? Elevated PSA 06/22/2015  ? Erectile dysfunction 06/22/2015  ? History of pulmonary embolus (PE) 04/21/2015  ? HTN (hypertension) 04/21/2015  ? Gout 04/21/2015  ? Hyperlipidemia 04/21/2015  ? ? ?Conditions to be addressed/monitored: monitor client management of medical needs faced by client ? ?Care Plan : LCSW Care Plan  ?Updates made by [name], LCSW since 01/20/2022 12:00 AM  ?  ? ?Problem: Coping Skills (General Plan of Care)   ?  ? ?Goal: Coping Skills Enhanced. Manage medical needs. Manage anxiety issues   ?Start Date: 01/20/2022  ?Expected End Date: 04/06/2022  ?This Visit's Progress: On track  ?Priority: Medium  ?Note:   ?Current barriers:   ? ?Anxiety issues ?Challenges in managing medical needs ?Hearing challenges (wears 2 hearing aids).   ? ?Clinical Goals:  ? ?LCSW will communicate with client in next 30 days in person or via phone to discuss client coping skills in managing medical needs ?Client will attend scheduled medical appointment in next 30 days ?Client to communicate with RNCM as needed in next 30 days to discuss nursing needs of client ? ?Clinical Interventions:  ?Collaboration with Billy Halon, MD regarding development and update of comprehensive plan of care as evidenced by provider attestation and co-signature ?Assessment of needs, barriers of client ?Discussed client needs with [name]. Discussed client needs with Billy Hunt, significant other of client.  ?Reviewed appetite of client. Reviewed sleeping issues of client. ?Reviewed mood of client. Client said he has had a stable mood. He did not mention any mood issues at present. He has support from [name], significant other.  She is Charity fundraiser and is very knowledgeable related to understanding his medical needs and conditions ?Reviewed relaxation techniques of client. He likes playing guitar, likes listening to music . He likes recording  music. ?Encouraged client or Marjean Donna to communicate with RNCM [name] as needed to discuss nursing needs of client ?Reviewed client work history.  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, cardiologist ?Provided client and Marjean Donna with phone number for LCSW Lorna Few and [facility] [name] ?Informed client of resource at Dr. Isidoro Donning office of dietician, Norm Salt to discuss healthy eating of client, use of supplements, protein intake through diet ? ?Patient Coping Skills: ?Drives to needed local appointments. ?Has support from Billy Hunt ?Takes medications as prescribed ?Attends scheduled medical appointments ? ?Patient Deficits: ?Some anxiety issues ?Coping challenges in managing medical condtions ? ?Patient Goals: In next 30 days, patient will: ?Attend scheduled medical appointments ?Take medications as prescribed ?Continue to communicate regularly with Billy Hunt related to his daily needs ?Communicate with RNCM as needed to discuss nursing needs of client ?-  ?Follow Up Plan: LCSW to call client or Billy Hunt on 03/02/22 at 1:00 PM to discuss client needs at that time   ? ?Kelton Pillar.Greer Wainright MSW, LCSW ?Licensed Clinical Social Worker ?[facility] Care Management ?[phone] ?

## 2022-01-20 NOTE — Patient Instructions (Signed)
Visit Information ? ?Thank you for taking time to visit with me today. Please don't hesitate to contact me if I can be of assistance to you before our next scheduled telephone appointment. ? ?Following are the goals we discussed today:  ? ?Our next appointment is by telephone on June 22,2023 at 1:00 PM ? ?Please call the care guide team at [phone] if you need to cancel or reschedule your appointment.  ? ?If you are experiencing a Mental Health or Behavioral Health Crisis or need someone to talk to, please call the [facility]: [phone]  ? ?Following is a copy of your full plan of care:  ?Care Plan : LCSW Care Plan  ?Updates made by Billy Blakes, LCSW since 01/20/2022 12:00 AM  ?  ? ?Problem: Coping Skills (General Plan of Care)   ?  ? ?Goal: Coping Skills Enhanced. Manage medical needs. Manage anxiety issues   ?Start Date: 01/20/2022  ?Expected End Date: 04/06/2022  ?This Visit's Progress: On track  ?Priority: Medium  ?Note:   ?Current barriers:   ? ?Anxiety issues ?Challenges in managing medical needs ?Hearing challenges (wears 2 hearing aids).   ? ?Clinical Goals:  ? ?LCSW will communicate with client in next 30 days in person or via phone to discuss client coping skills in managing medical needs ?Client will attend scheduled medical appointment in next 30 days ?Client to communicate with RNCM as needed in next 30 days to discuss nursing needs of client ? ?Clinical Interventions:  ?Collaboration with Billy Halon, MD regarding development and update of comprehensive plan of care as evidenced by provider attestation and co-signature ?Assessment of needs, barriers of client ?Discussed client needs with Billy Hunt. Discussed client needs with Billy Hunt, significant other of client.  ?Reviewed appetite of client. Reviewed sleeping issues of client. ?Reviewed mood of client. Client said he has had a stable mood. He did not mention any mood issues at present. He has support  from Billy Hunt, significant other.  She is Charity fundraiser and is very knowledgeable related to understanding his medical needs and conditions ?Reviewed relaxation techniques of client. He likes playing guitar, likes listening to music . He likes recording music. ?Encouraged client or Billy Hunt to communicate with RNCM Billy Hunt as needed to discuss nursing needs of client ?Reviewed client work history.  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 ?Patient Coping Skills: ?Drives to needed local appointments. ?Has support from Billy Hunt ?Takes medications as prescribed ?Attends scheduled medical appointments ? ?Patient Deficits: ?Some anxiety issues ?Coping challenges in managing medical condtions ? ?Patient Goals: In next 30 days, patient will: ?Attend scheduled medical appointments ?Take medications as prescribed ?Continue to communicate regularly with Billy Hunt related to his daily needs ?Communicate with RNCM as needed to discuss nursing needs of client ?-  ?Follow Up Plan: LCSW to call client or Billy Hunt on 03/02/22 at 1:00 PM to discuss client needs at that time   ? ?Billy Hunt was given information about Care Management services by the embedded care coordination team including:  ?Care Management services include personalized support from designated clinical staff supervised by his physician, including individualized plan of care  and coordination with other care providers ?24/7 contact phone numbers for assistance for urgent and routine care needs. ?The patient may stop CCM services at any time (effective at the end of the month) by phone call to the  office staff. ? ?Patient agreed to services and verbal consent obtained.  ? ?Billy Hunt.Billy Hunt MSW, LCSW ?Licensed Clinical Social Worker ?[facility] Care Management ?[phone] ?

## 2022-01-25 NOTE — Progress Notes (Signed)
Cardiology Office Note   Date:  01/28/2022   ID:  Billy Hunt, Billy Hunt 03/31/1946, MRN [id number]  PCP:  Lindell Spar, MD  Cardiologist:   Dorris Carnes, MD   F?U of CAD      History of Present Illness: Billy Hunt is a 76 y.o. male with a history of CAD (NSTEMI in 06/2021), Cath showed multivessel dz.   Pt underwent PCI/DES to an occluded OM1 and then PCI/DES to LAD (staged intervention)    Pt also with a hx of HTN, hyperparthyoidism, BPH, DVT/PE, HL, gout, neuropathy, COPD    I saw the pt in clnic in March 2023   At that time he said he held his carvedilol and losartan and was feeling better   His BP was high in clinic so I recomm he bring cuff  Since seen he has been OK   Denies dizziness  Walking regulary    BP readings at home    110s to 140/     He denies CP   Current Meds  Medication Sig   albuterol (VENTOLIN HFA) 108 (90 Base) MCG/ACT inhaler Inhale 2 puffs into the lungs every 6 (six) hours as needed for wheezing or shortness of breath.   aspirin 81 MG EC tablet Take 1 tablet (81 mg total) by mouth daily. Swallow whole.   cholecalciferol (VITAMIN D3) 25 MCG (1000 UT) tablet Take 1,000 Units by mouth daily.   cinacalcet (SENSIPAR) 30 MG tablet Take 1 tablet (30 mg total) by mouth daily with breakfast.   clopidogrel (PLAVIX) 75 MG tablet Take 4 tablets (300mg  loading dose) by mouth once approximately 12 hours after last dose of Brilinta, then decrease to 1 tablet daily thereafter   nitroGLYCERIN (NITROSTAT) 0.4 MG SL tablet Place 1 tablet (0.4 mg total) under the tongue every 5 (five) minutes x 3 doses as needed for chest pain.   tamsulosin (FLOMAX) 0.4 MG CAPS capsule Take 1 capsule (0.4 mg total) by mouth daily.   venlafaxine XR (EFFEXOR-XR) 150 MG 24 hr capsule Take 1 capsule (150 mg total) by mouth daily.     Allergies:   Sulfa antibiotics and Sulfamethoxazole   Past Medical History:  Diagnosis Date   Acute coronary syndrome ([place]) 07/04/2021   Anxiety    BPH  (benign prostatic hyperplasia)    Gout    Hypercholesterolemia    Hyperparathyroidism ([place])    Hypertension    Pulmonary emboli ([place])    Vitamin D deficiency    Vitamin D deficiency disease 10/07/2019    Past Surgical History:  Procedure Laterality Date   CORONARY STENT INTERVENTION N/A 07/04/2021   Procedure: CORONARY STENT INTERVENTION;  Surgeon: Nelva Bush, MD;  Location: [place] [facility];  Service: Cardiovascular;  Laterality: N/A;   CORONARY STENT INTERVENTION N/A 07/06/2021   Procedure: CORONARY STENT INTERVENTION;  Surgeon: Troy Sine, MD;  Location: [place] [facility];  Service: Cardiovascular;  Laterality: N/A;   LEFT HEART CATH AND CORONARY ANGIOGRAPHY N/A 07/04/2021   Procedure: LEFT HEART CATH AND CORONARY ANGIOGRAPHY;  Surgeon: Nelva Bush, MD;  Location: [place] [facility];  Service: Cardiovascular;  Laterality: N/A;   PERIPHERAL VASCULAR THROMBECTOMY     TRANSURETHRAL RESECTION OF PROSTATE     VASECTOMY       Social History:  The patient  reports that he quit smoking about 50 years ago. His smoking use included cigarettes. He has a 10.00 pack-year smoking history. He has never used smokeless tobacco. He  reports that he does not currently use drugs after having used the following drugs: Marijuana. He reports that he does not drink alcohol.   Family History:  The patient's family history includes Alcohol abuse in his paternal grandfather; Alzheimer's disease in his mother; Breast cancer in his mother; Cancer in his father and paternal grandmother; Deep vein thrombosis in his brother; Heart disease in his brother; Hypertension in his father; Stroke in his mother.    ROS:  Please see the history of present illness. All other systems are reviewed and  Negative to the above problem except as noted.    PHYSICAL EXAM: VS:  BP 130/66   Pulse 62   Ht 5\' 9"  (1.753 m)   Wt 170 lb 3.2 oz (77.2 kg)   SpO2 99%   BMI 25.13 kg/m   GEN: Well nourished, well  developed, in no acute distress  HEENT: normal  Neck: no JVD Cardiac: RRR; no murmurs,  No LE edema  Respiratory:  clear to auscultation bilaterally, GI: soft, nontender, nondistended, + BS  No hepatomegaly  MS: no deformity Moving all extremities   Skin: warm and dry, no rash Neuro:  Strength and sensation are intact Psych: euthymic mood, full affect   EKG:  EKG is not ordered today.  CATH 06/2021     Ost LM to Mid LM lesion is 20% stenosed.   Prox LAD lesion is 30% stenosed.   Mid LAD-2 lesion is 70% stenosed.   Prox Cx to Mid Cx lesion is 40% stenosed.   1st Diag-1 lesion is 30% stenosed.   1st Diag-2 lesion is 70% stenosed.   2nd Diag lesion is 80% stenosed.   1st Mrg lesion is 70% stenosed.   Mid LAD-1 lesion is 90% stenosed.   Mid LAD-3 lesion is 60% stenosed.   Non-stenotic 2nd Mrg lesion was previously treated.   A drug-eluting stent was successfully placed.   Post intervention, there is a 0% residual stenosis.   Post intervention, there is a 0% residual stenosis.   Post intervention, there is a 0% residual stenosis.   Successful staged PCI to the LAD in this patient who is 2 days status post initial presentation with ACS and total occlusion of the circumflex marginal vessel which was stented.   The LAD had diffuse disease of 90, 70 % and 60% between the 1st and second diagonal vessel which was successfully stented with a 2.5 x 38 mm Onyx frontier DES stent postdilated to 2.75 mm with the stenosis being reduced to 0%.   RECOMMENDATION: DAPT for minimum of 12 months in this patient status post ACS 2 days previously with successful stenting of the circumflex marginal vessel, and is successful staged PCI to diffusely diseased mid LAD today.  Medical therapy for concomitant RCA disease.  Aggressive lipid-lowering therapy with target LDL less than 70 and optimal blood pressure control.  Diagnostic Dominance: Right Intervention         PCI 07/04/2021 Dr. Saunders Revel CORONARY  STENT INTERVENTION  LEFT HEART CATH AND CORONARY ANGIOGRAPHY    Conclusion   Conclusions: Significant multivessel coronary artery disease, as detailed below.  Culprit lesion for the patient's NSTEMI is likely occluded ostial/proximal OM2 branch.  There is also severe mid LAD and D1 disease. Low normal left ventricular filling pressure (LVEDP ~5 mmHg). Successful PCI to OM2 using Onyx Frontier 2.5 x 26 mm drug-eluting stent with 0% residual stenosis and TIMI-3 flow.   Recommendations: Continue cangrelor infusion for 2 hours following ticagrelor load.  Aggressive secondary prevention. Wean IV nitroglycerin as tolerated. Consider staged PCI to mid LAD; timing (during this admission versus as an outpatient in the next few weeks) to be determined based on symptoms.     Diagnostic Dominance: Right Intervention        Echocardiogram 07/04/2021 1. Left ventricular ejection fraction, by estimation, is 60 to 65%. The  left ventricle has normal function. The left ventricle has no regional  wall motion abnormalities. The left ventricular internal cavity size was  mildly dilated. There is mild left  ventricular hypertrophy. Left ventricular diastolic parameters are  consistent with Grade I diastolic dysfunction (impaired relaxation).   2. Right ventricular systolic function is normal. The right ventricular  size is normal. There is mildly elevated pulmonary artery systolic  pressure.   3. Left atrial size was mildly dilated.   4. The mitral valve is normal in structure. Mild mitral valve  regurgitation.   5. The aortic valve is tricuspid. Aortic valve regurgitation is mild.  Mild aortic valve sclerosis is present, with no evidence of aortic valve  stenosis.   Comparison(s): The left ventricular function is unchanged.          Lipid Panel    Component Value Date/Time   CHOL 97 (L) 09/28/2021 0844   TRIG 54 09/28/2021 0844   HDL 49 09/28/2021 0844   CHOLHDL 2.0 09/28/2021 0844    CHOLHDL 4.4 07/05/2021 0320   VLDL 9 07/05/2021 0320   LDLCALC 35 09/28/2021 0844      Wt Readings from Last 3 Encounters:  01/26/22 170 lb 3.2 oz (77.2 kg)  12/27/21 165 lb (74.8 kg)  12/02/21 165 lb (74.8 kg)      ASSESSMENT AND PLAN:   CAD    Pt s/p NSTEMI in October 2022   s/p PTCA/Stent to OM2   Denies angina    2   HTN    BP readings are good at home and heare today    Continue to follow at home     3   HL  Pt on REpatha       2  HTN   BP low normal off of meds    High heare    Need to bring cuff to make sure accurate     3  HL  Lipids are excellent  LDL 35  HDL 49  Trig 54       Current medicines are reviewed at length with the patient today.  The patient does not have concerns regarding medicines.  Signed, Dorris Carnes, MD  01/28/2022 8:41 PM    [address] [place], [place], [place]  [address] Phone: [phone]; Fax: [phone]

## 2022-01-26 ENCOUNTER — Encounter: Payer: Self-pay | Admitting: Internal Medicine

## 2022-01-26 ENCOUNTER — Encounter: Payer: Self-pay | Admitting: *Deleted

## 2022-01-26 ENCOUNTER — Ambulatory Visit (INDEPENDENT_AMBULATORY_CARE_PROVIDER_SITE_OTHER): Payer: Medicare Other | Admitting: Internal Medicine

## 2022-01-26 VITALS — BP 130/66 | HR 62 | Ht 69.0 in | Wt 170.2 lb

## 2022-01-26 DIAGNOSIS — I25110 Atherosclerotic heart disease of native coronary artery with unstable angina pectoris: Secondary | ICD-10-CM | POA: Diagnosis not present

## 2022-01-26 DIAGNOSIS — I2510 Atherosclerotic heart disease of native coronary artery without angina pectoris: Secondary | ICD-10-CM

## 2022-01-26 DIAGNOSIS — Z006 Encounter for examination for normal comparison and control in clinical research program: Secondary | ICD-10-CM

## 2022-01-26 MED ORDER — STUDY - V-INCEPTION - INCLISIRAN (KJX839) 284 MG/1.5 ML SQ INJECTION (PI-COOPER): 284.0000 mg | PREFILLED_SYRINGE | Freq: Once | SUBCUTANEOUS | 0 refills | Status: AC

## 2022-01-26 NOTE — Research (Signed)
Med list updated

## 2022-01-26 NOTE — Patient Instructions (Signed)
Medication Instructions:  Your physician recommends that you continue on your current medications as directed. Please refer to the Current Medication list given to you today.  *If you need a refill on your cardiac medications before your next appointment, please call your pharmacy*   Lab Work: NONE   If you have labs (blood work) drawn today and your tests are completely normal, you will receive your results only by: MyChart Message (if you have MyChart) OR A paper copy in the mail If you have any lab test that is abnormal or we need to change your treatment, we will call you to review the results.   Testing/Procedures: NONE    Follow-Up: At [facility], you and your health needs are our priority.  As part of our continuing mission to provide you with exceptional heart care, we have created designated Provider Care Teams.  These Care Teams include your primary Cardiologist (physician) and Advanced Practice Providers (APPs -  Physician Assistants and Nurse Practitioners) who all work together to provide you with the care you need, when you need it.  We recommend signing up for the patient portal called "MyChart".  Sign up information is provided on this After Visit Summary.  MyChart is used to connect with patients for Virtual Visits (Telemedicine).  Patients are able to view lab/test results, encounter notes, upcoming appointments, etc.  Non-urgent messages can be sent to your provider as well.   To learn more about what you can do with MyChart, go to [email or web address].    Your next appointment:   6 month(s)  The format for your next appointment:   In Person  Provider:   Paula Ross, MD    Other Instructions Thank you for choosing [facility]!    Important Information About Sugar       

## 2022-01-30 ENCOUNTER — Ambulatory Visit (INDEPENDENT_AMBULATORY_CARE_PROVIDER_SITE_OTHER): Payer: Medicare Other | Admitting: Urology

## 2022-01-30 ENCOUNTER — Encounter: Payer: Self-pay | Admitting: Urology

## 2022-01-30 VITALS — BP 155/74 | HR 77

## 2022-01-30 DIAGNOSIS — R972 Elevated prostate specific antigen [PSA]: Secondary | ICD-10-CM | POA: Diagnosis not present

## 2022-01-30 DIAGNOSIS — R310 Gross hematuria: Secondary | ICD-10-CM | POA: Diagnosis not present

## 2022-01-30 DIAGNOSIS — N401 Enlarged prostate with lower urinary tract symptoms: Secondary | ICD-10-CM | POA: Diagnosis not present

## 2022-01-30 DIAGNOSIS — N138 Other obstructive and reflux uropathy: Secondary | ICD-10-CM | POA: Diagnosis not present

## 2022-01-30 DIAGNOSIS — R350 Frequency of micturition: Secondary | ICD-10-CM | POA: Diagnosis not present

## 2022-01-30 DIAGNOSIS — R339 Retention of urine, unspecified: Secondary | ICD-10-CM | POA: Diagnosis not present

## 2022-01-30 LAB — URINALYSIS, ROUTINE W REFLEX MICROSCOPIC
Bilirubin, UA: NEGATIVE
Glucose, UA: NEGATIVE
Ketones, UA: NEGATIVE
Leukocytes,UA: NEGATIVE
Nitrite, UA: NEGATIVE
Protein,UA: NEGATIVE
Specific Gravity, UA: 1.01 (ref 1.005–1.030)
Urobilinogen, Ur: 0.2 mg/dL (ref 0.2–1.0)
pH, UA: 6 (ref 5.0–7.5)

## 2022-01-30 LAB — MICROSCOPIC EXAMINATION
Bacteria, UA: NONE SEEN
Epithelial Cells (non renal): NONE SEEN /hpf (ref 0–10)
RBC, Urine: NONE SEEN /hpf (ref 0–2)
Renal Epithel, UA: NONE SEEN /hpf
WBC, UA: NONE SEEN /hpf (ref 0–5)

## 2022-01-30 LAB — BLADDER SCAN AMB NON-IMAGING: Scan Result: >261

## 2022-01-30 MED ORDER — TAMSULOSIN HCL 0.4 MG PO CAPS: 0.4000 mg | ORAL_CAPSULE | Freq: Two times a day (BID) | ORAL | 3 refills | Status: DC

## 2022-01-30 NOTE — Progress Notes (Signed)
Assessment: 1. BPH with obstruction/lower urinary tract symptoms; s/p TURP 2017   2. Gross hematuria   3. Incomplete bladder emptying   4. Elevated PSA     Plan: I reviewed the findings on CT and cystoscopy regarding his gross hematuria.  No serious or life-threatening causes identified. I also discussed the findings of incomplete bladder emptying.  He is not having significant urinary symptoms at the present time.  He has not had any recent UTIs.   Continue tamsulosin - increase to BID to see if this improves his emptying. PSA results discussed.  Given his history of elevated PSA and findings suggestive of a benign etiology, will monitor for now. Return to office in 3 months   Chief Complaint:  Chief Complaint  Patient presents with   Benign Prostatic Hypertrophy    History of Present Illness:  Billy Hunt is a 76 y.o. year old male who is seen for further evaluation of gross hematuria, BPH with obstruction, and elevated PSA.   He noted onset of gross hematuria with clots on 12/01/2021.  He did have associated frequency and some slight dysuria.  Urinalysis from 12/01/2021 showed large blood, large leukocytes and positive nitrite.  A urine culture showed no growth.  His urine visibly cleared.  He reported no significant change in his symptoms prior to the onset of hematuria.  He has been managed with Flomax for a number of years.  No prior episodes of gross hematuria. IPSS = 14. PVR = 331 ml.  He has a history of BPH with obstruction and is status post a TURP in 4/17 by Dr. Odis Luster at [facility]. He does have a history of urethral stricture disease undergoing dilation when in college.  He also has a history of elevated PSA and has undergone 2 prior negative prostate biopsies. PSA from 02/06/2013: 5.65  CT hematuria protocol from 12/09/2021 demonstrated a punctate nonobstructive calculus in the superior pole of the right kidney, no renal masses or filling defects, and no  evidence of obstruction.  At his visit in 4/23, he had no further gross hematuria.  No dysuria or flank pain.  He continued on tamsulosin. IPSS = 8. He was evaluated with cystoscopy in 4/23 which showed regrowth of the prostate adenoma at the bladder neck, increased vascularity in the prostatic urethra, and bladder trabeculations with cellules. Urine cytology was negative for malignancy.  PSA from 4/23: 7.6 with 25% free  He returns today for follow-up.  He has had 1 episode of gross hematuria associated with strenuous activity since his last visit.  His urinary symptoms remain very stable.  He has nocturia 1-2 times.  No dysuria.  He feels like he empties his bladder well.  He continues on tamsulosin 0.4 mg daily. IPSS = 8 today.  Portions of the above documentation were copied from a prior visit for review purposes only.   Past Medical History:  Past Medical History:  Diagnosis Date   Acute coronary syndrome ([place]) 07/04/2021   Anxiety    BPH (benign prostatic hyperplasia)    Gout    Hypercholesterolemia    Hyperparathyroidism ([place])    Hypertension    Pulmonary emboli ([place])    Vitamin D deficiency    Vitamin D deficiency disease 10/07/2019    Past Surgical History:  Past Surgical History:  Procedure Laterality Date   CORONARY STENT INTERVENTION N/A 07/04/2021   Procedure: CORONARY STENT INTERVENTION;  Surgeon: Nelva Bush, MD;  Location: [place] [facility];  Service: Cardiovascular;  Laterality: N/A;   CORONARY STENT INTERVENTION N/A 07/06/2021   Procedure: CORONARY STENT INTERVENTION;  Surgeon: Troy Sine, MD;  Location: [place] [facility];  Service: Cardiovascular;  Laterality: N/A;   LEFT HEART CATH AND CORONARY ANGIOGRAPHY N/A 07/04/2021   Procedure: LEFT HEART CATH AND CORONARY ANGIOGRAPHY;  Surgeon: Nelva Bush, MD;  Location: [place] [facility];  Service: Cardiovascular;  Laterality: N/A;   PERIPHERAL VASCULAR THROMBECTOMY     TRANSURETHRAL RESECTION OF  PROSTATE     VASECTOMY      Allergies:  Allergies  Allergen Reactions   Sulfa Antibiotics Hives   Sulfamethoxazole Rash    Family History:  Family History  Problem Relation Age of Onset   Alzheimer's disease Mother        Vascular Dementia   Stroke Mother    Breast cancer Mother    Cancer Father        Multiple Myeloma   Hypertension Father    Deep vein thrombosis Brother    Heart disease Brother    Cancer Paternal Grandmother    Alcohol abuse Paternal Grandfather     Social History:  Social History   Tobacco Use   Smoking status: Former    Packs/day: 1.00    Years: 10.00    Pack years: 10.00    Types: Cigarettes    Quit date: 04/21/1971    Years since quitting: 50.8   Smokeless tobacco: Never  Vaping Use   Vaping Use: Never used  Substance Use Topics   Alcohol use: No    Comment: former   Drug use: Not Currently    Types: Marijuana    Comment: last use: 9/30    ROS: Constitutional:  Negative for fever, chills, weight loss CV: Negative for chest pain, previous MI, hypertension Respiratory:  Negative for shortness of breath, wheezing, sleep apnea, frequent cough GI:  Negative for nausea, vomiting, bloody stool, GERD  Physical exam: BP (!) 155/74   Pulse 77  GENERAL APPEARANCE:  Well appearing, well developed, well nourished, NAD HEENT:  Atraumatic, normocephalic, oropharynx clear NECK:  Supple without lymphadenopathy or thyromegaly ABDOMEN:  Soft, non-tender, no masses EXTREMITIES:  Moves all extremities well, without clubbing, cyanosis, or edema NEUROLOGIC:  Alert and oriented x 3, normal gait, CN II-XII grossly intact MENTAL STATUS:  appropriate BACK:  Non-tender to palpation, No CVAT SKIN:  Warm, dry, and intact   Results: U/A: Dipstick is blood  PVR:  261 ml

## 2022-03-02 ENCOUNTER — Ambulatory Visit: Payer: Medicare Other | Admitting: Licensed Clinical Social Worker

## 2022-03-02 DIAGNOSIS — I10 Essential (primary) hypertension: Secondary | ICD-10-CM

## 2022-03-02 DIAGNOSIS — N401 Enlarged prostate with lower urinary tract symptoms: Secondary | ICD-10-CM

## 2022-03-02 DIAGNOSIS — I25110 Atherosclerotic heart disease of native coronary artery with unstable angina pectoris: Secondary | ICD-10-CM

## 2022-03-02 DIAGNOSIS — Z86711 Personal history of pulmonary embolism: Secondary | ICD-10-CM

## 2022-03-02 DIAGNOSIS — F411 Generalized anxiety disorder: Secondary | ICD-10-CM

## 2022-03-02 DIAGNOSIS — E782 Mixed hyperlipidemia: Secondary | ICD-10-CM

## 2022-03-02 NOTE — Patient Instructions (Signed)
Visit Information  Thank you for taking time to visit with me today. Please don't hesitate to contact me if I can be of assistance to you before our next scheduled telephone appointment.  Following are the goals we discussed today:   Our next appointment is by telephone on 04/06/22 at 2:00 PM   Please call the care guide team at [phone] if you need to cancel or reschedule your appointment.   If you are experiencing a Mental Health or Behavioral Health Crisis or need someone to talk to, please call the [facility]: [phone]   Following is a copy of your full plan of care:  Care Plan : LCSW Care Plan  Updates made by [name], LCSW since 03/02/2022 12:00 AM     Problem: Coping Skills (General Plan of Care)      Goal: Coping Skills Enhanced. Manage medical needs. Manage anxiety issues   Start Date: 01/20/2022  Expected End Date: 06/01/2022  This Visit's Progress: On track  Recent Progress: On track  Priority: Medium  Note:   Current barriers:    Anxiety issues Challenges in managing medical needs Hearing challenges (wears 2 hearing aids).    Clinical Goals:   LCSW will communicate with client in next 30 days in person or via phone to discuss client coping skills in managing medical needs Client will attend scheduled medical appointment in next 30 days Client to communicate with RNCM as needed in next 30 days to discuss nursing needs of client  Clinical Interventions:  Collaboration with Anabel Halon, MD regarding development and update of comprehensive plan of care as evidenced by provider attestation and co-signature Assessment of needs, barriers of client Discussed client needs with [name].  Reviewed appetite of client. Reviewed sleeping issues of client. Reviewed mood of client. Client said he has had a stable mood. He did not mention any mood issues at present. He has support from [name], significant other.  She is Charity fundraiser  and is very knowledgeable related to understanding his medical needs and conditions Reviewed relaxation techniques of client. He likes playing guitar, likes listening to music . He likes recording music. Encouraged client or Marjean Donna to communicate with RNCM [name] as needed to discuss nursing needs of client Discussed client support from [name], cardiologist Reviewed medication procurement of client Reviewed ambulation of client. He said he is walking well  Patient Coping Skills: Drives to needed local appointments. Has support from [name] Takes medications as prescribed Attends scheduled medical appointments  Patient Deficits: Some anxiety issues Coping challenges in managing medical condtions  Patient Goals: In next 30 days, patient will: Attend scheduled medical appointments Take medications as prescribed Continue to communicate regularly with [name] related to his daily needs Communicate with RNCM as needed to discuss nursing needs of client -  Follow Up Plan: LCSW to call client or [name] on 04/06/22 at 2:00 PM     Mr. Mcfarren was given information about Care Management services by the embedded care coordination team including:  Care Management services include personalized support from designated clinical staff supervised by his physician, including individualized plan of care and coordination with other care providers 24/7 contact phone numbers for assistance for urgent and routine care needs. The patient may stop CCM services at any time (effective at the end of the month) by phone call to the office staff.  Patient agreed to services and verbal consent obtained.   Kelton Pillar.Maryela Tapper MSW,  LCSW Licensed Clinical Social Worker [facility] Care Management [phone]

## 2022-03-29 DIAGNOSIS — E210 Primary hyperparathyroidism: Secondary | ICD-10-CM | POA: Diagnosis not present

## 2022-03-30 LAB — COMPREHENSIVE METABOLIC PANEL
ALT: 12 IU/L (ref 0–44)
AST: 17 IU/L (ref 0–40)
Albumin/Globulin Ratio: 1.6 (ref 1.2–2.2)
Albumin: 4.4 g/dL (ref 3.8–4.8)
Alkaline Phosphatase: 93 IU/L (ref 44–121)
BUN/Creatinine Ratio: 13 (ref 10–24)
BUN: 14 mg/dL (ref 8–27)
Bilirubin Total: 0.5 mg/dL (ref 0.0–1.2)
CO2: 24 mmol/L (ref 20–29)
Calcium: 10.6 mg/dL — ABNORMAL HIGH (ref 8.6–10.2)
Chloride: 104 mmol/L (ref 96–106)
Creatinine, Ser: 1.11 mg/dL (ref 0.76–1.27)
Globulin, Total: 2.8 g/dL (ref 1.5–4.5)
Glucose: 92 mg/dL (ref 70–99)
Potassium: 4.4 mmol/L (ref 3.5–5.2)
Sodium: 144 mmol/L (ref 134–144)
Total Protein: 7.2 g/dL (ref 6.0–8.5)
eGFR: 70 mL/min/{1.73_m2} (ref >59)

## 2022-03-30 LAB — PTH, INTACT AND CALCIUM: PTH: 51 pg/mL (ref 15–65)

## 2022-04-06 ENCOUNTER — Ambulatory Visit: Payer: Medicare Other | Admitting: Licensed Clinical Social Worker

## 2022-04-06 DIAGNOSIS — E782 Mixed hyperlipidemia: Secondary | ICD-10-CM

## 2022-04-06 DIAGNOSIS — I10 Essential (primary) hypertension: Secondary | ICD-10-CM

## 2022-04-06 DIAGNOSIS — Z86711 Personal history of pulmonary embolism: Secondary | ICD-10-CM

## 2022-04-06 DIAGNOSIS — I25110 Atherosclerotic heart disease of native coronary artery with unstable angina pectoris: Secondary | ICD-10-CM

## 2022-04-06 DIAGNOSIS — F411 Generalized anxiety disorder: Secondary | ICD-10-CM

## 2022-04-06 DIAGNOSIS — E559 Vitamin D deficiency, unspecified: Secondary | ICD-10-CM

## 2022-04-06 DIAGNOSIS — N401 Enlarged prostate with lower urinary tract symptoms: Secondary | ICD-10-CM

## 2022-04-06 NOTE — Patient Instructions (Signed)
Visit Information  Thank you for taking time to visit with me today. Please don't hesitate to contact me if I can be of assistance to you before our next scheduled telephone appointment.  Following are the goals we discussed today:   Our next appointment is by telephone on 05/04/22 at 9:00 AM   Please call the care guide team at [phone] if you need to cancel or reschedule your appointment.   If you are experiencing a Mental Health or Behavioral Health Crisis or need someone to talk to, please call the [facility]: [phone]   Following is a copy of your full plan of care:  Care Plan : LCSW Care Plan  Updates made by [name], LCSW since 04/06/2022 12:00 AM     Problem: Coping Skills (General Plan of Care)      Goal: Coping Skills Enhanced. Manage medical needs. Manage anxiety issues   Start Date: 01/20/2022  Expected End Date: 06/01/2022  This Visit's Progress: On track  Recent Progress: On track  Priority: Medium  Note:   Current barriers:    Anxiety issues Challenges in managing medical needs Hearing challenges (wears 2 hearing aids).    Clinical Goals:   LCSW will communicate with client in next 30 days in person or via phone to discuss client coping skills in managing medical needs Client will attend scheduled medical appointment in next 30 days Client to communicate as needed in next 30 days with LCSW to discuss anxiety issues of client Client to communicate with RNCM as needed in next 30 days to discuss nursing needs of client  Clinical Interventions:  Collaboration with Anabel Halon, MD regarding development and update of comprehensive plan of care as evidenced by provider attestation and co-signature Assessment of needs, barriers of client Discussed client needs with [name].  Reviewed appetite of client. Reviewed sleeping issues of client. Reviewed mood of client. Client said he has had a stable mood. He did not  mention any mood issues at present. He has support from [name], significant other.  She is Charity fundraiser and is very knowledgeable related to understanding his medical needs and conditions Reviewed relaxation techniques of client. He likes playing guitar, likes listening to music . He likes recording music. Encouraged client to communicate with RNCM [name] as needed to discuss nursing needs of client Discussed client support from [name], cardiologist Reviewed medication procurement of client Reviewed ambulation of client. He said he is walking well Reviewed transport needs of client. He said he drives as needed to appointments and to complete errands Reviewed pain issues. He did not mention any pain issues at this time  Patient Coping Skills: Drives to needed local appointments. Has support from [name] Takes medications as prescribed Attends scheduled medical appointments  Patient Deficits: Some anxiety issues Coping challenges in managing medical condtions  Patient Goals: In next 30 days, patient will: Attend scheduled medical appointments Take medications as prescribed Continue to communicate regularly with [name] related to his daily needs Communicate with RNCM as needed to discuss nursing needs of client -  Follow Up Plan: LCSW to call client on 05/04/22 at 9:00 AM    Mr. Weidinger was given information about Care Management services by the embedded care coordination team including:  Care Management services include personalized support from designated clinical staff supervised by his physician, including individualized plan of care and coordination with other care providers 24/7 contact phone numbers for assistance for urgent  and routine care needs. The patient may stop CCM services at any time (effective at the end of the month) by phone call to the office staff.  Patient agreed to services and verbal consent obtained.   Kelton Pillar.Cheyanna Strick MSW,  LCSW Licensed Visual merchandiser [facility] Care Management [phone]

## 2022-04-06 NOTE — Chronic Care Management (AMB) (Signed)
Care Management Clinical Social Work Note  04/06/2022 Name: Billy Hunt MRN: [id number] DOB: 22-Sep-1945  Billy Hunt is a 76 y.o. year old male who is a primary care patient of Billy Halon, MD.  The Care Management team was consulted for assistance with chronic disease management and coordination needs.  Engaged with patient by telephone for follow up visit in response to provider referral for social work chronic care management and care coordination services  Consent to Services:  Billy Hunt was given information about Care Management services today including:  Care Management services includes personalized support from designated clinical staff supervised by his physician, including individualized plan of care and coordination with other care providers 24/7 contact phone numbers for assistance for urgent and routine care needs. The patient may stop case management services at any time by phone call to the office staff.  Patient agreed to services and consent obtained.   Assessment: Review of patient past medical history, allergies, medications, and health status, including review of relevant consultants reports was performed today as part of a comprehensive evaluation and provision of chronic care management and care coordination services.  SDOH (Social Determinants of Health) assessments and interventions performed:  SDOH Interventions    Flowsheet Row Most Recent Value  SDOH Interventions   Stress Interventions Other (Comment)  [Client may have occasional stress related to managing heart issues]        Advanced Directives Status: See [employer]yn[employer] application for related entries.  Care Plan  Allergies  Allergen Reactions   Sulfa Antibiotics Hives   Sulfamethoxazole Rash    Outpatient Encounter Medications as of 04/06/2022  Medication Sig   albuterol (VENTOLIN HFA) 108 (90 Base) MCG/ACT inhaler Inhale 2 puffs into the lungs every 6 (six) hours as needed for wheezing  or shortness of breath.   aspirin 81 MG EC tablet Take 1 tablet (81 mg total) by mouth daily. Swallow whole.   cholecalciferol (VITAMIN D3) 25 MCG (1000 UT) tablet Take 1,000 Units by mouth daily.   cinacalcet (SENSIPAR) 30 MG tablet Take 1 tablet (30 mg total) by mouth daily with breakfast.   clopidogrel (PLAVIX) 75 MG tablet Take 4 tablets (300mg  loading dose) by mouth once approximately 12 hours after last dose of Brilinta, then decrease to 1 tablet daily thereafter   nitroGLYCERIN (NITROSTAT) 0.4 MG SL tablet Place 1 tablet (0.4 mg total) under the tongue every 5 (five) minutes x 3 doses as needed for chest pain.   tamsulosin (FLOMAX) 0.4 MG CAPS capsule Take 1 capsule (0.4 mg total) by mouth 2 (two) times daily.   venlafaxine XR (EFFEXOR-XR) 150 MG 24 hr capsule Take 1 capsule (150 mg total) by mouth daily.   No facility-administered encounter medications on file as of 04/06/2022.    Patient Active Problem List   Diagnosis Date Noted   Gross hematuria 12/27/2021   Incomplete bladder emptying 12/27/2021   Acute cystitis with hematuria 12/04/2021   Chronic cough 07/21/2021   Coronary artery disease involving native coronary artery of native heart with unstable angina pectoris [facility])    NSTEMI (non-ST elevated myocardial infarction) (HCC) 07/04/2021   GAD (generalized anxiety disorder) 06/01/2021   Idiopathic peripheral neuropathy 06/01/2021   BPH with obstruction/lower urinary tract symptoms 10/07/2019   Hyperparathyroidism, primary (HCC) 11/20/2018   Osteopenia 11/20/2018   Anterior urethral stricture 06/22/2015   Elevated PSA 06/22/2015   Erectile dysfunction 06/22/2015   History of pulmonary embolus (PE) 04/21/2015   HTN (hypertension) 04/21/2015   Gout  04/21/2015   Hyperlipidemia 04/21/2015    Conditions to be addressed/monitored: monitor client management of medical needs  Care Plan : LCSW Care Plan  Updates made by Billy Blakes, LCSW since 04/06/2022 12:00 AM      Problem: Coping Skills (General Plan of Care)      Goal: Coping Skills Enhanced. Manage medical needs. Manage anxiety issues   Start Date: 01/20/2022  Expected End Date: 06/01/2022  This Visit's Progress: On track  Recent Progress: On track  Priority: Medium  Note:   Current barriers:    Anxiety issues Challenges in managing medical needs Hearing challenges (wears 2 hearing aids).    Clinical Goals:   LCSW will communicate with client in next 30 days in person or via phone to discuss client coping skills in managing medical needs Client will attend scheduled medical appointment in next 30 days Client to communicate as needed in next 30 days with LCSW to discuss anxiety issues of client Client to communicate with Billy as needed in next 30 days to discuss nursing needs of client  Clinical Interventions:  Collaboration with Billy Halon, MD regarding development and update of comprehensive plan of care as evidenced by provider attestation and co-signature Assessment of needs, barriers of client Discussed client needs with Billy Hunt.  Reviewed appetite of client. Reviewed sleeping issues of client. Reviewed mood of client. Client said he has had a stable mood. He did not mention any mood issues at present. He has support from Billy Hunt, significant other.  She is Billy Hunt and is very knowledgeable related to understanding his medical needs and conditions Reviewed relaxation techniques of client. He likes playing guitar, likes listening to music . He likes recording music. Encouraged client to communicate with Billy Hunt as needed to discuss nursing needs of client Discussed client support from Billy Hunt, cardiologist Reviewed medication procurement of client Reviewed ambulation of client. He said he is walking well Reviewed transport needs of client. He said he drives as needed to appointments and to complete errands Reviewed pain issues. He did not mention any  pain issues at this time  Patient Coping Skills: Drives to needed local appointments. Has support from Billy Hunt Takes medications as prescribed Attends scheduled medical appointments  Patient Deficits: Some anxiety issues Coping challenges in managing medical condtions  Patient Goals: In next 30 days, patient will: Attend scheduled medical appointments Take medications as prescribed Continue to communicate regularly with Billy Hunt related to his daily needs Communicate with Billy as needed to discuss nursing needs of client -  Follow Up Plan: LCSW to call client on 05/04/22 at 9:00 AM     Kelton Pillar.Sukari Grist MSW, LCSW Licensed Visual merchandiser [facility] Care Management [phone]

## 2022-04-10 ENCOUNTER — Encounter: Payer: Self-pay | Admitting: "Endocrinology

## 2022-04-10 ENCOUNTER — Ambulatory Visit (INDEPENDENT_AMBULATORY_CARE_PROVIDER_SITE_OTHER): Payer: Medicare Other | Admitting: "Endocrinology

## 2022-04-10 VITALS — BP 120/62 | HR 60 | Ht 69.0 in | Wt 171.4 lb

## 2022-04-10 DIAGNOSIS — E559 Vitamin D deficiency, unspecified: Secondary | ICD-10-CM

## 2022-04-10 DIAGNOSIS — E782 Mixed hyperlipidemia: Secondary | ICD-10-CM

## 2022-04-10 DIAGNOSIS — I25110 Atherosclerotic heart disease of native coronary artery with unstable angina pectoris: Secondary | ICD-10-CM

## 2022-04-10 DIAGNOSIS — R7303 Prediabetes: Secondary | ICD-10-CM | POA: Diagnosis not present

## 2022-04-10 DIAGNOSIS — M8580 Other specified disorders of bone density and structure, unspecified site: Secondary | ICD-10-CM | POA: Diagnosis not present

## 2022-04-10 DIAGNOSIS — E210 Primary hyperparathyroidism: Secondary | ICD-10-CM

## 2022-04-10 MED ORDER — CINACALCET HCL 30 MG PO TABS: 30.0000 mg | ORAL_TABLET | Freq: Two times a day (BID) | ORAL | 3 refills | Status: DC

## 2022-04-10 NOTE — Progress Notes (Signed)
04/10/2022, 12:15 PM  Endocrinology follow-up note   Billy Hunt is a 76 y.o.-year-old male, who is returning for follow-up after he was seen in consultation for hypercalcemia/hyperparathyroidism, osteopenia. PMD:   Lindell Spar, MD   Past Medical History:  Diagnosis Date   Acute coronary syndrome ([place]) 07/04/2021   Anxiety    BPH (benign prostatic hyperplasia)    Gout    Hypercholesterolemia    Hyperparathyroidism ([place])    Hypertension    Pulmonary emboli ([place])    Vitamin D deficiency    Vitamin D deficiency disease 10/07/2019    Past Surgical History:  Procedure Laterality Date   CORONARY STENT INTERVENTION N/A 07/04/2021   Procedure: CORONARY STENT INTERVENTION;  Surgeon: Nelva Bush, MD;  Location: [facility];  Service: Cardiovascular;  Laterality: N/A;   CORONARY STENT INTERVENTION N/A 07/06/2021   Procedure: CORONARY STENT INTERVENTION;  Surgeon: Troy Sine, MD;  Location: [facility];  Service: Cardiovascular;  Laterality: N/A;   LEFT HEART CATH AND CORONARY ANGIOGRAPHY N/A 07/04/2021   Procedure: LEFT HEART CATH AND CORONARY ANGIOGRAPHY;  Surgeon: Nelva Bush, MD;  Location: [facility];  Service: Cardiovascular;  Laterality: N/A;   PERIPHERAL VASCULAR THROMBECTOMY     TRANSURETHRAL RESECTION OF PROSTATE     VASECTOMY      Social History   Tobacco Use   Smoking status: Former    Packs/day: 1.00    Years: 10.00    Total pack years: 10.00    Types: Cigarettes    Quit date: 04/21/1971    Years since quitting: 51.0   Smokeless tobacco: Never  Vaping Use   Vaping Use: Never used  Substance Use Topics   Alcohol use: No    Comment: former   Drug use: Not Currently    Types: Marijuana    Comment: last use: 9/30    Family History  Problem Relation Age of Onset   Alzheimer's disease Mother        Vascular Dementia   Stroke Mother    Breast cancer Mother    Cancer Father        Multiple Myeloma    Hypertension Father    Deep vein thrombosis Brother    Heart disease Brother    Cancer Paternal Grandmother    Alcohol abuse Paternal Grandfather     Outpatient Encounter Medications as of 04/10/2022  Medication Sig   albuterol (VENTOLIN HFA) 108 (90 Base) MCG/ACT inhaler Inhale 2 puffs into the lungs every 6 (six) hours as needed for wheezing or shortness of breath.   aspirin 81 MG EC tablet Take 1 tablet (81 mg total) by mouth daily. Swallow whole.   cholecalciferol (VITAMIN D3) 25 MCG (1000 UT) tablet Take 1,000 Units by mouth daily.   cinacalcet (SENSIPAR) 30 MG tablet Take 1 tablet (30 mg total) by mouth 2 (two) times daily with a meal.   clopidogrel (PLAVIX) 75 MG tablet Take 4 tablets (356m loading dose) by mouth once approximately 12 hours after last dose of Brilinta, then decrease to 1 tablet daily thereafter   nitroGLYCERIN (NITROSTAT) 0.4 MG SL tablet Place 1 tablet (0.4 mg total) under the tongue every 5 (five) minutes x 3 doses as needed for chest pain.   tamsulosin (FLOMAX) 0.4 MG CAPS capsule Take 1 capsule (0.4 mg total) by mouth 2 (two) times daily.   venlafaxine XR (EFFEXOR-XR) 150 MG 24 hr capsule Take 1 capsule (150 mg  total) by mouth daily.   [DISCONTINUED] cinacalcet (SENSIPAR) 30 MG tablet Take 1 tablet (30 mg total) by mouth daily with breakfast.   No facility-administered encounter medications on file as of 04/10/2022.    Allergies  Allergen Reactions   Sulfa Antibiotics Hives   Sulfamethoxazole Rash     HPI  Chisum T Frieson reports that he was diagnosed with hypercalcemia approximately 6 years ago with blood test showing high PTH and high calcium.  He denies any history of nephrolithiasis, seizure disorder, cardiac dysrhythmias.  Denies any history of CKD.   -He however was found to have osteopenia in 2014.  His repeat bone density in [facility] on November 25, 2018 showed left femur osteopenia with a T score of -1.6, left forearm radius 33% T score of  -0.2, spine excluded due to degenerative changes.   His most recent bone density on November 26, 2020 shows similar findings still consistent with osteopenia.  -He is currently not on calcium supplements, however on vitamin D3 2000 units daily.  His previsit labs show vitamin D replete at 75.5.  -He denies any family history of parathyroid, thyroid, pituitary, adrenal dysfunction.   -He returns with slightly above target calcium at 10.6, PTH at 51.  Patient has overall improvement in his PTH from 130 and calcium dropping from 11.4.     - prior to his last visit, 24-hour urine calcium on November 22, 2018 was 221. He has no new complaints today.  No prior history of fragility fractures or falls.    He is a former  smoker.   he is not on HCTZ or other thiazide therapy.    His previsit labs show significant dyslipidemia, currently patient not on treatment.  He reports previous intolerance to Lipitor.   BP 120/62   Pulse 60   Ht 5' 9" (1.753 m)   Wt 171 lb 6.4 oz (77.7 kg)   BMI 25.31 kg/m , Body mass index is 25.31 kg/m.   CMP     Component Value Date/Time   NA 144 03/29/2022 0911   K 4.4 03/29/2022 0911   CL 104 03/29/2022 0911   CO2 24 03/29/2022 0911   GLUCOSE 92 03/29/2022 0911   GLUCOSE 137 (H) 07/05/2021 0320   BUN 14 03/29/2022 0911   CREATININE 1.11 03/29/2022 0911   CALCIUM 10.6 (H) 03/29/2022 0911   PROT 7.2 03/29/2022 0911   ALBUMIN 4.4 03/29/2022 0911   AST 17 03/29/2022 0911   ALT 12 03/29/2022 0911   ALKPHOS 93 03/29/2022 0911   BILITOT 0.5 03/29/2022 0911   GFRNONAA >60 07/05/2021 0320   GFRAA >60 07/27/2017 0100   Most recent official lab reports from September 17, 2018: 25-hydroxy vitamin D 43, PTH 76 elevated, calcium 11.4 mg per DL elevated, BUN 25, creatinine 0.98. -He came with separate handwritten summaries of his labs from 2014 showing PTH elevated between 89-1 67 between May and June 2014, 25 hydroxy vitamin D ranging between 8.6-27 between June and  August 2014, phosphorus range from 2.2-2.3 between May and August 2014, 24-hour urine calcium 192 mg in 24 hours in June 2014. DEXA scan showed mild osteopenia in June 2014.   Surveillance DEXA on November 25, 2018  DualFemur Neck Left 11/25/2018 70.4 N/A -1.6 0.868 g/cm2   Left Forearm Radius 33% 11/25/2018 70.4 N/A -0.2 0.787 g/cm2   ASSESSMENT: BMD as determined from Femur Neck Left is 0.868 g/cm2 with a T-Score of -1.6.   This patient is considered OSTEOPENIC   according to the World Health Organization (WHO) criteria.    Repeat bone density on November 26, 2020: DualFemur Neck Left 11/26/2020 72.4 N/A -1.6 0.857 g/cm2 -1.3% - DualFemur Neck Left 11/25/2018 70.4 N/A -1.6 0.868 g/cm2 - -   DualFemur Total Mean 11/26/2020 72.4 N/A -1.3 0.914 g/cm2 -6.3% Yes DualFemur Total Mean 11/25/2018 70.4 N/A -0.9 0.975 g/cm2 - -   Left Forearm Radius 33% 11/26/2020 72.4 N/A -0.5 0.768 g/cm2 -2.3% - Left Forearm Radius 33% 11/25/2018 70.4 N/A -0.2 0.787 g/cm2 - -   ASSESSMENT: BMD as determined from Femur Neck Left is 0.857 g/cm2 with aT-score of -1.6.   This patient is considered OSTEOPENIC according to World Health Organization (WHO) criteria.  Recent Results (from the past 2160 hour(s))  BLADDER SCAN AMB NON-IMAGING     Status: None   Collection Time: 01/30/22  2:52 PM  Result Value Ref Range   Scan Result >261   Urinalysis, Routine w reflex microscopic     Status: Abnormal   Collection Time: 01/30/22  3:39 PM  Result Value Ref Range   Specific Gravity, UA 1.010 1.005 - 1.030   pH, UA 6.0 5.0 - 7.5   Color, UA Yellow Yellow   Appearance Ur Clear Clear   Leukocytes,UA Negative Negative   Protein,UA Negative Negative/Trace   Glucose, UA Negative Negative   Ketones, UA Negative Negative   RBC, UA Trace (A) Negative   Bilirubin, UA Negative Negative   Urobilinogen, Ur 0.2 0.2 - 1.0 mg/dL   Nitrite, UA Negative Negative   Microscopic Examination See below:   Microscopic  Examination     Status: None   Collection Time: 01/30/22  3:39 PM   Urine  Result Value Ref Range   WBC, UA None seen 0 - 5 /hpf   RBC, Urine None seen 0 - 2 /hpf   Epithelial Cells (non renal) None seen 0 - 10 /hpf   Renal Epithel, UA None seen None seen /hpf   Bacteria, UA None seen None seen/Few  Comprehensive metabolic panel     Status: Abnormal   Collection Time: 03/29/22  9:11 AM  Result Value Ref Range   Glucose 92 70 - 99 mg/dL   BUN 14 8 - 27 mg/dL   Creatinine, Ser 1.11 0.76 - 1.27 mg/dL   eGFR 70 >59 mL/min/1.73   BUN/Creatinine Ratio 13 10 - 24   Sodium 144 134 - 144 mmol/L   Potassium 4.4 3.5 - 5.2 mmol/L   Chloride 104 96 - 106 mmol/L   CO2 24 20 - 29 mmol/L   Calcium 10.6 (H) 8.6 - 10.2 mg/dL   Total Protein 7.2 6.0 - 8.5 g/dL   Albumin 4.4 3.8 - 4.8 g/dL    Comment:               **Please note reference interval change**   Globulin, Total 2.8 1.5 - 4.5 g/dL   Albumin/Globulin Ratio 1.6 1.2 - 2.2   Bilirubin Total 0.5 0.0 - 1.2 mg/dL   Alkaline Phosphatase 93 44 - 121 IU/L   AST 17 0 - 40 IU/L   ALT 12 0 - 44 IU/L  PTH, intact and calcium     Status: None   Collection Time: 03/29/22  9:11 AM  Result Value Ref Range   PTH 51 15 - 65 pg/mL   PTH Interp Comment     Comment: Interpretation                 Intact   PTH    Calcium                                 (pg/mL)      (mg/dL) Normal                          15 - 65     8.6 - 10.2 Primary Hyperparathyroidism         >65          >10.2 Secondary Hyperparathyroidism       >65          <10.2 Non-Parathyroid Hypercalcemia       <65          >10.2 Hypoparathyroidism                  <15          < 8.6 Non-Parathyroid Hypocalcemia    15 - 65          < 8.6     Assessment: 1. Hypercalcemia / Hyperparathyroidism 2.  Osteopenia  Plan: His previsit repeat labs show PTH stable at 51, inappropriately normal for the high calcium of 10.6.   He is benefiting from Sensipar, however, will need a higher dose.  I  discussed and increase his Sensipar to 30 mg p.o. twice daily with meals.  His bone density is still consistent with osteopenia of femur.  His lumbar spine was excluded due to degenerative joint diseases. His repeat 24-hour urine calcium is 221.  This is still consistent with primary hyperparathyroidism at a mild form.  He does not have criteria for considering surgery at this time.   -He remains on low-dose vitamin D supplement.  His current vitamin D supplements is with vitamin D3 1000 units daily, advised to continue.     He will have repeat PTH/calcium for his next visit in 6 months.  He will be due for next bone density in March 2024.  He is a current diagnosis of severe dyslipidemia puts him at risk of cardiovascular disease.  Considering his prediabetes, he is a good candidate for lifestyle medicine.  - he acknowledges that there is a room for improvement in his food and drink choices. - Suggestion is made for him to avoid simple carbohydrates  from his diet including Cakes, Sweet Desserts, Ice Cream, Soda (diet and regular), Sweet Tea, Candies, Chips, Cookies, Store Bought Juices, Alcohol , Artificial Sweeteners,  Coffee Creamer, and "Sugar-free" Products, Lemonade. This will help patient to have more stable blood glucose profile and potentially avoid unintended weight gain.  The following Lifestyle Medicine recommendations according to American College of Lifestyle Medicine  ( ACLM) were discussed and and offered to patient and he  agrees to start the journey:  A. Whole Foods, Plant-Based Nutrition comprising of fruits and vegetables, plant-based proteins, whole-grain carbohydrates was discussed in detail with the patient.   A list for source of those nutrients were also provided to the patient.  Patient will use only water or unsweetened tea for hydration. B.  The need to stay away from risky substances including alcohol, smoking; obtaining 7 to 9 hours of restorative sleep, at least 150  minutes of moderate intensity exercise weekly, the importance of healthy social connections,  and stress management techniques were discussed.  His next previsit labs will include fasting lipid panel.  -He is advised to continue his primary   care follow-up with Dr. Patel.    I spent 32 minutes in the care of the patient today including review of labs from Thyroid Function, CMP, and other relevant labs ; imaging/biopsy records (current and previous including abstractions from other facilities); face-to-face time discussing  his lab results and symptoms, medications doses, his options of short and long term treatment based on the latest standards of care / guidelines;   and documenting the encounter.  Dorean T Krinsky  participated in the discussions, expressed understanding, and voiced agreement with the above plans.  All questions were answered to his satisfaction. he is encouraged to contact clinic should he have any questions or concerns prior to his return visit.    - Return in about 3 months (around 07/11/2022) for F/U with Pre-visit Labs, A1c -NV.   Gebre Nida, MD [facility] [address] [place], [place] [address] Phone: [phone]  Fax: [phone]    This note was partially dictated with voice recognition software. Similar sounding words can be transcribed inadequately or may not  be corrected upon review.  04/10/2022, 12:15 PM 

## 2022-04-10 NOTE — Patient Instructions (Signed)
                                     Advice for Weight Management  -For most of us the best way to lose weight is by diet management. Generally speaking, diet management means consuming less calories intentionally which over time brings about progressive weight loss.  This can be achieved more effectively by avoiding ultra processed carbohydrates, processed meats, unhealthy fats.    It is critically important to know your numbers: how much calorie you are consuming and how much calorie you need. More importantly, our carbohydrates sources should be unprocessed naturally occurring  complex starch food items.  It is always important to balance nutrition also by  appropriate intake of proteins (mainly plant-based), healthy fats/oils, plenty of fruits and vegetables.   -The American College of Lifestyle Medicine (ACL M) recommends nutrition derived mostly from Whole Food, Plant Predominant Sources example an apple instead of applesauce or apple pie. Eat Plenty of vegetables, Mushrooms, fruits, Legumes, Whole Grains, Nuts, seeds in lieu of processed meats, processed snacks/pastries red meat, poultry, eggs.  Use only water or unsweetened tea for hydration.  The College also recommends the need to stay away from risky substances including alcohol, smoking; obtaining 7-9 hours of restorative sleep, at least 150 minutes of moderate intensity exercise weekly, importance of healthy social connections, and being mindful of stress and seek help when it is overwhelming.    -Sticking to a routine mealtime to eat 3 meals a day and avoiding unnecessary snacks is shown to have a big role in weight control. Under normal circumstances, the only time we burn stored energy is when we are hungry, so allow  some hunger to take place- hunger means no food between appropriate meal times, only water.  It is not advisable to starve.   -It is better to avoid simple carbohydrates including:  Cakes, Sweet Desserts, Ice Cream, Soda (diet and regular), Sweet Tea, Candies, Chips, Cookies, Store Bought Juices, Alcohol in Excess of  1-2 drinks a day, Lemonade,  Artificial Sweeteners, Doughnuts, Coffee Creamers, "Sugar-free" Products, etc, etc.  This is not a complete list.....    -Consulting with certified diabetes educators is proven to provide you with the most accurate and current information on diet.  Also, you may be  interested in discussing diet options/exchanges , we can schedule a visit with Billy Hunt, RDN, CDE for individualized nutrition education.  -Exercise: If you are able: 30 -60 minutes a day ,4 days a week, or 150 minutes of moderate intensity exercise weekly.    The longer the better if tolerated.  Combine stretch, strength, and aerobic activities.  If you were told in the past that you have high risk for cardiovascular diseases, or if you are currently symptomatic, you may seek evaluation by your heart doctor prior to initiating moderate to intense exercise programs.                                  Additional Care Considerations for Diabetes/Prediabetes   -Diabetes  is a chronic disease.  The most important care consideration is regular follow-up with your diabetes care provider with the goal being avoiding or delaying its complications and to take advantage of advances in medications and technology.  If appropriate actions are taken early enough, type 2 diabetes can even be   reversed.  Seek information from the right source.  - Whole Food, Plant Predominant Nutrition is highly recommended: Eat Plenty of vegetables, Mushrooms, fruits, Legumes, Whole Grains, Nuts, seeds in lieu of processed meats, processed snacks/pastries red meat, poultry, eggs as recommended by American College of  Lifestyle Medicine (ACLM).  -Type 2 diabetes is known to coexist with other important comorbidities such as high blood pressure and high cholesterol.  It is critical to control not only the  diabetes but also the high blood pressure and high cholesterol to minimize and delay the risk of complications including coronary artery disease, stroke, amputations, blindness, etc.  The good news is that this diet recommendation for type 2 diabetes is also very helpful for managing high cholesterol and high blood blood pressure.  - Studies showed that people with diabetes will benefit from a class of medications known as ACE inhibitors and statins.  Unless there are specific reasons not to be on these medications, the standard of care is to consider getting one from these groups of medications at an optimal doses.  These medications are generally considered safe and proven to help protect the heart and the kidneys.    - People with diabetes are encouraged to initiate and maintain regular follow-up with eye doctors, foot doctors, dentists , and if necessary heart and kidney doctors.     - It is highly recommended that people with diabetes quit smoking or stay away from smoking, and get yearly  flu vaccine and pneumonia vaccine at least every 5 years.  See above for additional recommendations on exercise, sleep, stress management , and healthy social connections.      

## 2022-04-17 ENCOUNTER — Ambulatory Visit
Admission: EM | Admit: 2022-04-17 | Discharge: 2022-04-17 | Disposition: A | Discharge status: Home / Self Care | Payer: Medicare Other | Attending: Nurse Practitioner | Admitting: Nurse Practitioner

## 2022-04-17 DIAGNOSIS — M79674 Pain in right toe(s): Secondary | ICD-10-CM

## 2022-04-17 MED ORDER — PREDNISONE 20 MG PO TABS: ORAL_TABLET | ORAL | 0 refills | Status: DC

## 2022-04-17 MED ORDER — COLCHICINE 0.6 MG PO TABS: ORAL_TABLET | ORAL | 0 refills | Status: DC

## 2022-04-17 NOTE — Discharge Instructions (Addendum)
-   We will call you if the uric acid level comes back elevated - Please start the colchicine medication and take it as prescribed to help with the gout flare; you can continue the Tylenol as needed but should need less of it - Follow up with PCP if no better with treatment and for further evaluation/prevention of gout

## 2022-04-17 NOTE — ED Provider Notes (Signed)
[facility]    CSN: [id number] Arrival date & time: 04/17/22  0912      History   Chief Complaint Chief Complaint  Patient presents with   Toe Pain    HPI Billy Hunt is a 76 y.o. male.   Patient presents with right great toe pain that began Friday when he woke up.  He also reports the toe is red and swollen and tender to touch.  Denies any known injury, accident, or trauma to the toe.  He reports the pain is worse with walking and moving it.  He denies radiation of pain anywhere else.  Has been taking Tylenol 1000 mg which helps with the pain.  Denies morning stiffness, thinks the swelling has gone down some from when it first started.  Denies any bruising, numbness or tinging, fevers, and nausea/vomiting.  Patient reports a history of gout in his right great toe.  He drank a couple of beer last week per his report and does not usually drink beer.     Past Medical History:  Diagnosis Date   Acute coronary syndrome ([place]) 07/04/2021   Anxiety    BPH (benign prostatic hyperplasia)    Gout    Hypercholesterolemia    Hyperparathyroidism ([place])    Hypertension    Pulmonary emboli ([place])    Vitamin D deficiency    Vitamin D deficiency disease 10/07/2019    Patient Active Problem List   Diagnosis Date Noted   Prediabetes 04/10/2022   Vitamin D deficiency 04/10/2022   Gross hematuria 12/27/2021   Incomplete bladder emptying 12/27/2021   Acute cystitis with hematuria 12/04/2021   Chronic cough 07/21/2021   Coronary artery disease involving native coronary artery of native heart with unstable angina pectoris [facility])    NSTEMI (non-ST elevated myocardial infarction) ([place]) 07/04/2021   GAD (generalized anxiety disorder) 06/01/2021   Idiopathic peripheral neuropathy 06/01/2021   BPH with obstruction/lower urinary tract symptoms 10/07/2019   Hyperparathyroidism, primary ([place]) 11/20/2018   Osteopenia 11/20/2018   Anterior urethral stricture 06/22/2015   Elevated PSA  06/22/2015   Erectile dysfunction 06/22/2015   History of pulmonary embolus (PE) 04/21/2015   HTN (hypertension) 04/21/2015   Gout 04/21/2015   Mixed hyperlipidemia 04/21/2015    Past Surgical History:  Procedure Laterality Date   CORONARY STENT INTERVENTION N/A 07/04/2021   Procedure: CORONARY STENT INTERVENTION;  Surgeon: Nelva Bush, MD;  Location: [place] [facility];  Service: Cardiovascular;  Laterality: N/A;   CORONARY STENT INTERVENTION N/A 07/06/2021   Procedure: CORONARY STENT INTERVENTION;  Surgeon: Troy Sine, MD;  Location: [place] [facility];  Service: Cardiovascular;  Laterality: N/A;   LEFT HEART CATH AND CORONARY ANGIOGRAPHY N/A 07/04/2021   Procedure: LEFT HEART CATH AND CORONARY ANGIOGRAPHY;  Surgeon: Nelva Bush, MD;  Location: [place] [facility];  Service: Cardiovascular;  Laterality: N/A;   PERIPHERAL VASCULAR THROMBECTOMY     TRANSURETHRAL RESECTION OF PROSTATE     VASECTOMY         Home Medications    Prior to Admission medications   Medication Sig Start Date End Date Taking? Authorizing Provider  colchicine 0.6 MG tablet Take 1.2 mg PO once then 0.6 mg 1 hour later.  Take 0.6 mg daily thereafter until resolution of pain. 04/17/22  Yes Eulogio Bear, NP  albuterol (VENTOLIN HFA) 108 (90 Base) MCG/ACT inhaler Inhale 2 puffs into the lungs every 6 (six) hours as needed for wheezing or shortness of breath. 07/21/21   Posey Pronto,  Colin Broach, MD  aspirin 81 MG EC tablet Take 1 tablet (81 mg total) by mouth daily. Swallow whole. 07/07/21   Cheryln Manly, NP  cholecalciferol (VITAMIN D3) 25 MCG (1000 UT) tablet Take 1,000 Units by mouth daily.    [provider]  cinacalcet (SENSIPAR) 30 MG tablet Take 1 tablet (30 mg total) by mouth 2 (two) times daily with a meal. 04/10/22   Nida, Marella Chimes, MD  clopidogrel (PLAVIX) 75 MG tablet Take 4 tablets (371m loading dose) by mouth once approximately 12 hours after last dose of Brilinta, then  decrease to 1 tablet daily thereafter 10/26/21   CElgie Collard PA-C  nitroGLYCERIN (NITROSTAT) 0.4 MG SL tablet Place 1 tablet (0.4 mg total) under the tongue every 5 (five) minutes x 3 doses as needed for chest pain. 07/06/21   RCheryln Manly NP  tamsulosin (FLOMAX) 0.4 MG CAPS capsule Take 1 capsule (0.4 mg total) by mouth 2 (two) times daily. 01/30/22   Stoneking, BReece Leader, MD  venlafaxine XR (EFFEXOR-XR) 150 MG 24 hr capsule Take 1 capsule (150 mg total) by mouth daily. 11/09/21   PLindell Spar MD    Family History Family History  Problem Relation Age of Onset   Alzheimer's disease Mother        Vascular Dementia   Stroke Mother    Breast cancer Mother    Cancer Father        Multiple Myeloma   Hypertension Father    Deep vein thrombosis Brother    Heart disease Brother    Cancer Paternal Grandmother    Alcohol abuse Paternal Grandfather     Social History Social History   Tobacco Use   Smoking status: Former    Packs/day: 1.00    Years: 10.00    Total pack years: 10.00    Types: Cigarettes    Quit date: 04/21/1971    Years since quitting: 51.0   Smokeless tobacco: Never  Vaping Use   Vaping Use: Never used  Substance Use Topics   Alcohol use: No    Comment: former   Drug use: Not Currently    Types: Marijuana    Comment: last use: 9/30     Allergies   Sulfa antibiotics and Sulfamethoxazole   Review of Systems Review of Systems Per HPI  Physical Exam Triage Vital Signs ED Triage Vitals  Enc Vitals Group     BP 04/17/22 0945 (!) 157/82     Pulse Rate 04/17/22 0945 69     Resp 04/17/22 0945 16     Temp 04/17/22 0945 98.3 F (36.8 C)     Temp Source 04/17/22 0945 Oral     SpO2 04/17/22 0945 98 %     Weight --      Height --      Head Circumference --      Peak Flow --      Pain Score 04/17/22 0944 8     Pain Loc --      Pain Edu? --      Excl. in [place] --    No data found.  Updated Vital Signs BP (!) 157/82 (BP Location: Right Arm)    Pulse 69   Temp 98.3 F (36.8 C) (Oral)   Resp 16   SpO2 98%   Visual Acuity Right Eye Distance:   Left Eye Distance:   Bilateral Distance:    Right Eye Near:   Left Eye Near:    Bilateral  Near:     Physical Exam Vitals and nursing note reviewed.  Constitutional:      General: He is not in acute distress.    Appearance: Normal appearance. He is not toxic-appearing.  HENT:     Mouth/Throat:     Mouth: Mucous membranes are moist.     Pharynx: Oropharynx is clear.  Pulmonary:     Effort: Pulmonary effort is normal. No respiratory distress.  Musculoskeletal:     Right foot: Normal range of motion and normal capillary refill. Swelling, prominent metatarsal heads, tenderness and bony tenderness present. No laceration or crepitus. Normal pulse.     Left foot: Normal.     Comments: Inspection: Swelling and redness to the right great toe; no obvious deformity Palpation: Left great toe tender to palpation; remaining toes nontender; no obvious deformities palpated ROM: Full ROM toes 1 through 5, foot, and ankle Strength: 5/5 bilateral lower extremities Neurovascular: neurovascularly intact in left and right lower extremity   Skin:    General: Skin is warm and dry.     Capillary Refill: Capillary refill takes less than 2 seconds.     Findings: Erythema present.  Neurological:     Mental Status: He is alert and oriented to person, place, and time.  Psychiatric:        Behavior: Behavior is cooperative.      UC Treatments / Results  Labs (all labs ordered are listed, but only abnormal results are displayed) Labs Reviewed  URIC ACID    EKG   Radiology No results found.  Procedures Procedures (including critical care time)  Medications Ordered in UC Medications - No data to display  Initial Impression / Assessment and Plan / UC Course  I have reviewed the triage vital signs and the nursing notes.  Pertinent labs & imaging results that were available during my care  of the patient were reviewed by me and considered in my medical decision making (see chart for details).    Patient is a very pleasant, well-appearing 77 year old male presenting for right great toe pain today.  Examination history consistent with a gout flare.  Uric acid level checked today.  Will prescribe colchicine 1.2 mg today with 0.6 mg 1 hour later today.  Tomorrow and thereafter, take 1 tablet or 0.6 mg daily until flare resolves.  Follow-up with primary care provider to discuss preventative treatment if indicated.  After patient left, I was notified by the pharmacy that the patient was prescribed carvedilol.  I then prescribed him prednisone instead of the colchicine.  I called the patient to confirm medication list and he reported that he was no longer taking the carvedilol because it caused bradycardia.  We agreed again that he should take the colchicine as prescribed.  The nurse then called the pharmacy to inform them of this and request that they fill the colchicine as previously prescribed.  Prednisone prescription canceled.  The patient was given the opportunity to ask questions.  All questions answered to their satisfaction.  The patient is in agreement to this plan.   Final Clinical Impressions(s) / UC Diagnoses   Final diagnoses:  Great toe pain, right     Discharge Instructions      - We will call you if the uric acid level comes back elevated - Please start the colchicine medication and take it as prescribed to help with the gout flare; you can continue the Tylenol as needed but should need less of it - Follow up with PCP  if no better with treatment and for further evaluation/prevention of gout   ED Prescriptions     Medication Sig Dispense Auth. Provider   colchicine 0.6 MG tablet Take 1.2 mg PO once then 0.6 mg 1 hour later.  Take 0.6 mg daily thereafter until resolution of pain. 15 tablet Eulogio Bear, NP      PDMP not reviewed this encounter.    Eulogio Bear, NP 04/17/22 1225

## 2022-04-17 NOTE — ED Triage Notes (Signed)
Pt report pain and swelling in right big toe x 3 days. Tylenol 1000 mg gives some relief. Pt think pain is related to gout.

## 2022-04-18 LAB — URIC ACID: Uric Acid: 7.6 mg/dL (ref 3.8–8.4)

## 2022-04-20 ENCOUNTER — Ambulatory Visit (INDEPENDENT_AMBULATORY_CARE_PROVIDER_SITE_OTHER): Payer: Medicare Other | Admitting: Internal Medicine

## 2022-04-20 ENCOUNTER — Encounter: Payer: Self-pay | Admitting: Internal Medicine

## 2022-04-20 VITALS — BP 118/82 | HR 56 | Resp 18 | Ht 69.0 in | Wt 171.2 lb

## 2022-04-20 DIAGNOSIS — M109 Gout, unspecified: Secondary | ICD-10-CM

## 2022-04-20 MED ORDER — PREDNISONE 20 MG PO TABS: 40.0000 mg | ORAL_TABLET | Freq: Every day | ORAL | 0 refills | Status: DC

## 2022-04-20 NOTE — Progress Notes (Signed)
Acute Office Visit  Subjective:    Patient ID: Billy Hunt, male    DOB: 03/05/1946, 76 y.o.   MRN: [id number]  Chief Complaint  Patient presents with   Foot Swelling    Patient has had foot pain swelling and red went to urgent care on 04-17-22 started 04-14-22 urgent care did check uric acid was not that high     HPI Patient is in today for complaint of right great toe pain and swelling for the last 5 days.  He reports taking 2 beers to days before the onset of toe pain and swelling.  He went to urgent care, and was given colchicine for great toe pain.  Apparently, he took half the recommended dose and still has pain and swelling.  He has also tried taking Tylenol with some relief.  He used to take allopurinol for gout in the past.  Past Medical History:  Diagnosis Date   Acute coronary syndrome ([place]) 07/04/2021   Anxiety    BPH (benign prostatic hyperplasia)    Gout    Hypercholesterolemia    Hyperparathyroidism ([place])    Hypertension    Pulmonary emboli ([place])    Vitamin D deficiency    Vitamin D deficiency disease 10/07/2019    Past Surgical History:  Procedure Laterality Date   CORONARY STENT INTERVENTION N/A 07/04/2021   Procedure: CORONARY STENT INTERVENTION;  Surgeon: Nelva Bush, MD;  Location: [place] [facility];  Service: Cardiovascular;  Laterality: N/A;   CORONARY STENT INTERVENTION N/A 07/06/2021   Procedure: CORONARY STENT INTERVENTION;  Surgeon: Troy Sine, MD;  Location: [place] [facility];  Service: Cardiovascular;  Laterality: N/A;   LEFT HEART CATH AND CORONARY ANGIOGRAPHY N/A 07/04/2021   Procedure: LEFT HEART CATH AND CORONARY ANGIOGRAPHY;  Surgeon: Nelva Bush, MD;  Location: [place] [facility];  Service: Cardiovascular;  Laterality: N/A;   PERIPHERAL VASCULAR THROMBECTOMY     TRANSURETHRAL RESECTION OF PROSTATE     VASECTOMY      Family History  Problem Relation Age of Onset   Alzheimer's disease Mother        Vascular Dementia    Stroke Mother    Breast cancer Mother    Cancer Father        Multiple Myeloma   Hypertension Father    Deep vein thrombosis Brother    Heart disease Brother    Cancer Paternal Grandmother    Alcohol abuse Paternal Grandfather     Social History   Socioeconomic History   Marital status: Divorced    Spouse name: Not on file   Number of children: 2   Years of education: 12   Highest education level: Master's degree (e.g., MA, MS, MEng, MEd, MSW, MBA)  Occupational History   Not on file  Tobacco Use   Smoking status: Former    Packs/day: 1.00    Years: 10.00    Total pack years: 10.00    Types: Cigarettes    Quit date: 04/21/1971    Years since quitting: 51.0   Smokeless tobacco: Never  Vaping Use   Vaping Use: Never used  Substance and Sexual Activity   Alcohol use: No    Comment: former   Drug use: Not Currently    Types: Marijuana    Comment: last use: 9/30   Sexual activity: Yes    Birth control/protection: Other-see comments  Other Topics Concern   Not on file  Social History Narrative   Not on file  Social Determinants of Health   Financial Resource Strain: Low Risk  (09/20/2021)   Overall Financial Resource Strain (CARDIA)    Difficulty of Paying Living Expenses: Not hard at all  Food Insecurity: No Food Insecurity (09/20/2021)   Hunger Vital Sign    Worried About Running Out of Food in the Last Year: Never true    Ran Out of Food in the Last Year: Never true  Transportation Needs: No Transportation Needs (09/20/2021)   PRAPARE - Hydrologist (Medical): No    Lack of Transportation (Non-Medical): No  Physical Activity: Sufficiently Active (09/20/2021)   Exercise Vital Sign    Days of Exercise per Week: 5 days    Minutes of Exercise per Session: 40 min  Stress: Stress Concern Present (04/06/2022)   [place]    Feeling of Stress : To some extent  Social  Connections: Socially Isolated (09/20/2021)   Social Connection and Isolation Panel [NHANES]    Frequency of Communication with Friends and Family: Three times a week    Frequency of Social Gatherings with Friends and Family: Once a week    Attends Religious Services: Never    Marine scientist or Organizations: No    Attends Archivist Meetings: Never    Marital Status: Divorced  Human resources officer Violence: Unknown (09/20/2021)   Humiliation, Afraid, Rape, and Kick questionnaire    Fear of Current or Ex-Partner: No    Emotionally Abused: No    Physically Abused: Not on file    Sexually Abused: No    Outpatient Medications Prior to Visit  Medication Sig Dispense Refill   albuterol (VENTOLIN HFA) 108 (90 Base) MCG/ACT inhaler Inhale 2 puffs into the lungs every 6 (six) hours as needed for wheezing or shortness of breath. 8 g 0   aspirin 81 MG EC tablet Take 1 tablet (81 mg total) by mouth daily. Swallow whole. 90 tablet 1   cholecalciferol (VITAMIN D3) 25 MCG (1000 UT) tablet Take 1,000 Units by mouth daily.     cinacalcet (SENSIPAR) 30 MG tablet Take 1 tablet (30 mg total) by mouth 2 (two) times daily with a meal. 60 tablet 3   clopidogrel (PLAVIX) 75 MG tablet Take 4 tablets (333m loading dose) by mouth once approximately 12 hours after last dose of Brilinta, then decrease to 1 tablet daily thereafter 90 tablet 3   nitroGLYCERIN (NITROSTAT) 0.4 MG SL tablet Place 1 tablet (0.4 mg total) under the tongue every 5 (five) minutes x 3 doses as needed for chest pain. 25 tablet 2   tamsulosin (FLOMAX) 0.4 MG CAPS capsule Take 1 capsule (0.4 mg total) by mouth 2 (two) times daily. 180 capsule 3   venlafaxine XR (EFFEXOR-XR) 150 MG 24 hr capsule Take 1 capsule (150 mg total) by mouth daily. 90 capsule 3   predniSONE (DELTASONE) 20 MG tablet Take 3 tablets (666m on days 1-2. Take 2 tablets (4030mon days 3-4. Take 1 tablet (48m6mn days 5-6, then stop. (Patient not taking: Reported on  04/20/2022) 12 tablet 0   No facility-administered medications prior to visit.    Allergies  Allergen Reactions   Sulfa Antibiotics Hives   Sulfamethoxazole Rash    Review of Systems  Constitutional:  Negative for chills and fever.  HENT:  Negative for congestion and sore throat.   Eyes:  Negative for pain and discharge.  Respiratory:  Negative for cough and shortness of  breath.   Cardiovascular:  Negative for chest pain and palpitations.  Gastrointestinal:  Negative for diarrhea, nausea and vomiting.  Endocrine: Negative for polydipsia and polyuria.  Genitourinary:  Negative for dysuria and hematuria.  Musculoskeletal:  Negative for neck pain and neck stiffness.       Right great toe pain and swelling  Skin:  Negative for rash.  Neurological:  Negative for dizziness, weakness and headaches.  Psychiatric/Behavioral:  Negative for agitation and behavioral problems.        Objective:    Physical Exam Vitals reviewed.  Constitutional:      General: He is not in acute distress.    Appearance: He is not diaphoretic.  Eyes:     General: No scleral icterus.    Extraocular Movements: Extraocular movements intact.  Cardiovascular:     Rate and Rhythm: Normal rate and regular rhythm.     Pulses: Normal pulses.     Heart sounds: Normal heart sounds. No murmur heard. Pulmonary:     Breath sounds: Normal breath sounds. No wheezing or rales.  Musculoskeletal:        General: Swelling (Right 1st MTP joint, with warmth and tenderness) present.     Cervical back: Neck supple. No tenderness.     Right lower leg: No edema.     Left lower leg: No edema.  Skin:    General: Skin is warm.     Findings: No rash.  Neurological:     General: No focal deficit present.     Mental Status: He is alert and oriented to person, place, and time.  Psychiatric:        Mood and Affect: Mood normal.        Behavior: Behavior normal.     BP 118/82 (BP Location: Right Arm, Patient Position:  Sitting, Cuff Size: Normal)   Pulse (!) 56   Resp 18   Ht 5' 9"  (1.753 m)   Wt 171 lb 3.2 oz (77.7 kg)   SpO2 100%   BMI 25.28 kg/m  Wt Readings from Last 3 Encounters:  04/20/22 171 lb 3.2 oz (77.7 kg)  04/10/22 171 lb 6.4 oz (77.7 kg)  01/26/22 170 lb 3.2 oz (77.2 kg)        Assessment & Plan:   Problem List Items Addressed This Visit       Other   Gout - Primary    Acute flareup Avoid NSAID due to h/o CAD Started prednisone 40 mg X 5 days Urgent care visit note reviewed Would like to check uric acid level when he is acute flareup resolves Based on uric acid level, will discuss starting Allopurinol      Relevant Medications   predniSONE (DELTASONE) 20 MG tablet     Meds ordered this encounter  Medications   predniSONE (DELTASONE) 20 MG tablet    Sig: Take 2 tablets (40 mg total) by mouth daily with breakfast.    Dispense:  10 tablet    Refill:  0     Harlo Fabela Keith Rake, MD

## 2022-04-20 NOTE — Patient Instructions (Signed)
Please take Prednisone as prescribed.

## 2022-04-20 NOTE — Assessment & Plan Note (Addendum)
Acute flareup Avoid NSAID due to h/o CAD Started prednisone 40 mg X 5 days Urgent care visit note reviewed Would like to check uric acid level when he is acute flareup resolves Based on uric acid level, will discuss starting Allopurinol

## 2022-04-21 ENCOUNTER — Ambulatory Visit: Payer: Self-pay | Admitting: Licensed Clinical Social Worker

## 2022-04-21 DIAGNOSIS — I10 Essential (primary) hypertension: Secondary | ICD-10-CM

## 2022-04-21 DIAGNOSIS — I25110 Atherosclerotic heart disease of native coronary artery with unstable angina pectoris: Secondary | ICD-10-CM

## 2022-04-21 DIAGNOSIS — E782 Mixed hyperlipidemia: Secondary | ICD-10-CM

## 2022-04-21 DIAGNOSIS — R350 Frequency of micturition: Secondary | ICD-10-CM

## 2022-04-21 DIAGNOSIS — F411 Generalized anxiety disorder: Secondary | ICD-10-CM

## 2022-04-21 DIAGNOSIS — M109 Gout, unspecified: Secondary | ICD-10-CM

## 2022-04-21 NOTE — Patient Instructions (Addendum)
Visit Information  Thank you for taking time to visit with me today. Please don't hesitate to contact me if I can be of assistance to you before our next scheduled telephone appointment.  Following are the goals we discussed today:   No follow up appointment is needed at present  Please call the care guide team at [phone] if you need to cancel or reschedule your appointment.   If you are experiencing a Mental Health or Behavioral Health Crisis or need someone to talk to, please call the [facility]: [phone]   Following is a copy of your full plan of care:  Care Plan : LCSW Care Plan  Updates made by Billy Blakes, LCSW since 04/21/2022 12:00 AM     Problem: Coping Skills (General Plan of Care)      Goal: Coping Skills Enhanced. Manage medical needs. Manage anxiety issues   Start Date: 01/20/2022  Expected End Date: 06/01/2022  This Visit's Progress: On track  Recent Progress: On track  Priority: Medium  Note:   Current barriers:    Anxiety issues Challenges in managing medical needs Hearing challenges (wears 2 hearing aids).    Clinical Goals:   LCSW will communicate with client in next 30 days in person or via phone to discuss client coping skills in managing medical needs Client will attend scheduled medical appointment in next 30 days Client to communicate as needed in next 30 days with LCSW to discuss anxiety issues of client Client to communicate with RNCM as needed in next 30 days to discuss nursing needs of client  Clinical Interventions:  Collaboration with Billy Halon, MD regarding development and update of comprehensive plan of care as evidenced by provider attestation and co-signature Assessment of needs, barriers of client Discussed client needs with Billy Hunt.  Reviewed medication procurement of client Reviewed ambulation of client. He said he has some walking challenges at present due to gout issues.  Reviewed  transport needs of client Reviewed SW needs of client. Client is stable related to current SW needs. LCSW informed Billy Hunt of Care Coordination program services . LCSW informed Billy Hunt that Billy Hunt was RN for nursing support through that program and Billy Hunt was LCSW for that program. Client was appreciative of information and is aware he can talk with PCP about Care Coordination support for client if needed.   Patient Coping Skills: Drives to needed local appointments. Has support from Billy Hunt Takes medications as prescribed Attends scheduled medical appointments  Patient Deficits: Some anxiety issues Coping challenges in managing medical condtions  Patient Goals: In next 30 days, patient will: Attend scheduled medical appointments Take medications as prescribed Continue to communicate regularly with Billy Hunt related to his daily needs Communicate with RNCM as needed to discuss nursing needs of client -  Follow Up Plan: Client will talk with PCP as needed related to Care Coordination support for client.     Billy Hunt was given information about Care Management services by the embedded care coordination team including:  Care Management services include personalized support from designated clinical staff supervised by his physician, including individualized plan of care and coordination with other care providers 24/7 contact phone numbers for assistance for urgent and routine care needs. The patient may stop CCM services at any time (effective at the end of the month) by phone call to the office staff.  Patient agreed to services and verbal consent obtained.   Billy Hunt MSW, LCSW Licensed Clinical Social  Worker [facility] Care Management [phone]

## 2022-04-21 NOTE — Chronic Care Management (AMB) (Signed)
Care Management Clinical Social Work Note  04/21/2022 Name: Billy Hunt MRN: [id number] DOB: 1945/10/13  Billy Hunt is a 76 y.o. year old male who is a primary care patient of Anabel Halon, MD.  The Care Management team was consulted for assistance with chronic disease management and coordination needs.  Engaged with patient by telephone for follow up visit in response to provider referral for social work chronic care management and care coordination services  Consent to Services:  Billy Hunt was given information about Care Management services today including:  Care Management services includes personalized support from designated clinical staff supervised by his physician, including individualized plan of care and coordination with other care providers 24/7 contact phone numbers for assistance for urgent and routine care needs. The patient may stop case management services at any time by phone call to the office staff.  Patient agreed to services and consent obtained.   Assessment: Review of patient past medical history, allergies, medications, and health status, including review of relevant consultants reports was performed today as part of a comprehensive evaluation and provision of chronic care management and care coordination services.  SDOH (Social Determinants of Health) assessments and interventions performed:  SDOH Interventions    Flowsheet Row Most Recent Value  SDOH Interventions   Stress Interventions Other (Comment)  [client has stress related to current gout issue]  Social Connections Interventions Other (Comment)  [risk of social isolation]        Advanced Directives Status: See [employer]yn[employer] application for related entries.  Care Plan  Allergies  Allergen Reactions   Sulfa Antibiotics Hives   Sulfamethoxazole Rash    Outpatient Encounter Medications as of 04/21/2022  Medication Sig   albuterol (VENTOLIN HFA) 108 (90 Base) MCG/ACT inhaler Inhale 2 puffs  into the lungs every 6 (six) hours as needed for wheezing or shortness of breath.   aspirin 81 MG EC tablet Take 1 tablet (81 mg total) by mouth daily. Swallow whole.   cholecalciferol (VITAMIN D3) 25 MCG (1000 UT) tablet Take 1,000 Units by mouth daily.   cinacalcet (SENSIPAR) 30 MG tablet Take 1 tablet (30 mg total) by mouth 2 (two) times daily with a meal.   clopidogrel (PLAVIX) 75 MG tablet Take 4 tablets (300mg  loading dose) by mouth once approximately 12 hours after last dose of Brilinta, then decrease to 1 tablet daily thereafter   nitroGLYCERIN (NITROSTAT) 0.4 MG SL tablet Place 1 tablet (0.4 mg total) under the tongue every 5 (five) minutes x 3 doses as needed for chest pain.   predniSONE (DELTASONE) 20 MG tablet Take 2 tablets (40 mg total) by mouth daily with breakfast.   tamsulosin (FLOMAX) 0.4 MG CAPS capsule Take 1 capsule (0.4 mg total) by mouth 2 (two) times daily.   venlafaxine XR (EFFEXOR-XR) 150 MG 24 hr capsule Take 1 capsule (150 mg total) by mouth daily.   No facility-administered encounter medications on file as of 04/21/2022.    Patient Active Problem List   Diagnosis Date Noted   Prediabetes 04/10/2022   Vitamin D deficiency 04/10/2022   Gross hematuria 12/27/2021   Incomplete bladder emptying 12/27/2021   Acute cystitis with hematuria 12/04/2021   Chronic cough 07/21/2021   Coronary artery disease involving native coronary artery of native heart with unstable angina pectoris [facility])    NSTEMI (non-ST elevated myocardial infarction) (HCC) 07/04/2021   GAD (generalized anxiety disorder) 06/01/2021   Idiopathic peripheral neuropathy 06/01/2021   BPH with obstruction/lower urinary tract symptoms 10/07/2019  Hyperparathyroidism, primary (HCC) 11/20/2018   Osteopenia 11/20/2018   Anterior urethral stricture 06/22/2015   Elevated PSA 06/22/2015   Erectile dysfunction 06/22/2015   History of pulmonary embolus (PE) 04/21/2015   HTN (hypertension) 04/21/2015   Gout  04/21/2015   Mixed hyperlipidemia 04/21/2015    Conditions to be addressed/monitored: monitor anxiety issues of client  Care Plan : LCSW Care Plan  Updates made by Billy Blakes, LCSW since 04/21/2022 12:00 AM     Problem: Coping Skills (General Plan of Care)      Goal: Coping Skills Enhanced. Manage medical needs. Manage anxiety issues   Start Date: 01/20/2022  Expected End Date: 06/01/2022  This Visit's Progress: On track  Recent Progress: On track  Priority: Medium  Note:   Current barriers:    Anxiety issues Challenges in managing medical needs Hearing challenges (wears 2 hearing aids).    Clinical Goals:   LCSW will communicate with client in next 30 days in person or via phone to discuss client coping skills in managing medical needs Client will attend scheduled medical appointment in next 30 days Client to communicate as needed in next 30 days with LCSW to discuss anxiety issues of client Client to communicate with RNCM as needed in next 30 days to discuss nursing needs of client  Clinical Interventions:  Collaboration with Anabel Halon, MD regarding development and update of comprehensive plan of care as evidenced by provider attestation and co-signature Assessment of needs, barriers of client Discussed client needs with Billy Hunt.  Reviewed medication procurement of client Reviewed ambulation of client. He said he has some walking challenges at present due to gout issues.  Reviewed transport needs of client Reviewed SW needs of client. Client is stable related to current SW needs. LCSW informed Billy Hunt of Care Coordination program services . LCSW informed Billy Hunt that Billy Hunt was RN for nursing support through that program and Billy Hunt was LCSW for that program. Client was appreciative of information and is aware he can talk with PCP about Care Coordination support for client if needed.   Patient Coping Skills: Drives to needed local  appointments. Has support from Billy Hunt Takes medications as prescribed Attends scheduled medical appointments  Patient Deficits: Some anxiety issues Coping challenges in managing medical condtions  Patient Goals: In next 30 days, patient will: Attend scheduled medical appointments Take medications as prescribed Continue to communicate regularly with Billy Hunt related to his daily needs Communicate with RNCM as needed to discuss nursing needs of client -  Follow Up Plan: Client will talk with PCP as needed related to Care Coordination support for client.    Kelton Pillar.Rasheen Schewe MSW, LCSW Licensed Visual merchandiser [facility] Care Management [phone]

## 2022-04-26 ENCOUNTER — Encounter: Payer: Medicare Other | Admitting: *Deleted

## 2022-04-26 VITALS — BP 128/63 | HR 61 | Temp 98.1°F | Wt 167.0 lb

## 2022-04-26 DIAGNOSIS — Z006 Encounter for examination for normal comparison and control in clinical research program: Secondary | ICD-10-CM

## 2022-04-26 MED ORDER — STUDY - V-INCEPTION - INCLISIRAN (KJX839) 284 MG/1.5 ML SQ INJECTION (PI-COOPER)
284.0000 mg | PREFILLED_SYRINGE | Freq: Once | SUBCUTANEOUS | Status: AC
  Administered 2022-04-26: 284 mg via SUBCUTANEOUS
  Filled 2022-04-26: qty 1.5

## 2022-04-26 NOTE — Research (Addendum)
V-inception Visit 4 Day 270 Patient doing well, no complaints.  Only med change was Sensipar increased to BID   Vital Signs   Date of Measurement _16_/_AUG_/__2023_____ DD/MMM/YYYY  Weight/Weight Units ____167______ LBS  Pulse Rate ____61___________ Beats per Minute  Systolic Blood Pressure ____128_________   Diastolic Blood Pressure _______63________  Blood Pressure Position ______right arm sitting __________  Was smoking status assessed [x]   Yes   OR   []  NO   Type of Substance []  Tobacco - []   Yes   OR   [x]  NO                     [] Former                     []  Current                     [] Never []  Chewing Tobacco - []   Yes   OR   [x]  NO                     [] Former                     []  Current                     [] Never [x]  Marijuana - [x]   Yes   OR   []  NO                     [] Former                     [x]  Current                     [] Never [] Vape - []   Yes   OR   [x]  NO                     [] Former                     []  Current                      [] Never []  Other___________________________________    Was Assessment performed [x]   Yes   OR   []  NO Date of Collection: _16_/_AUG__/_2023______                               DD/MMM/YYYY Was the Patient Fasting:  [x]   Yes  OR   []  NO Last date and time of solid foods: _15_/AUG_/_2023___      _1830__ DD/MMM/YYYY               24 Hour  Accession Number  "Is the subject of childbearing potential? []   Yes   OR   [x]  NO   Serum Pregnancy Test: Was Central laboratory assessment performed?  (Visit 1and Unscheduled) []   Yes   OR   [x]  NO Reason Not done:_________________________  Hematology: Was Central Laboratory assessment performed? (Visit 1, 2, 5, and unscheduled) []   Yes   OR   [x]  NO Reason Not done:_________________________  Hepatitis: Was Central Laboratory assessment performed? (Visit1 and Unscheduled) []   Yes   OR   [x]  NO Reason Not done:_________________________  Chemistry: Was  Central Laboratory Assessment performed? (Visit 1, 2, 3, 4,  5 and Unscheduled) [x]   Yes   OR   []  NO Reason Not done:_________________________  Lipoprotein(s): Was Central Laboratory Assessment performed?  (Visit 1, 2, 3, 4, 5 and Unscheduled) [x]   Yes   OR   []  NO Reason Not done:_________________________  Coagulation: Was Central Laboratory Assessment performed? (Visit 1, 2, and unscheduled) []   Yes   OR   [x]  NO Reason Not done:_________________________  Fasting Glucose: Was Central Laboratory Assessment performed?  (Visit 1, 2, 3, 4, 5 and Unscheduled) [x]   Yes   OR   []  NO Reason Not done:_________________________  BioBank Sample: Was Central Laboratory Assessment performed?  (Visit 2, 3, 4, 5 and Unscheduled) [x]   Yes   OR   []  NO Reason Not done:_________________________   Was Study Medication Administered? [x]   Yes   OR   []  NO  Reason (if not) []  Adverse Event []  Dosing error []  Technical Problems []  Physician Decision []  Subject Decision []  COVID-19 Related []  As Per Protocol - Unscheduled Visit  Covid-19 Relationship  _____________________________________________    Date of Dose Administered _16_/_aug_/_2023__ DD/MMM/YYYY  Dose Form   Dose Administered [x]  Inclisiran Sodium 300mg /1.53mL  Units [] Syringe  Route []  Subcutaneous  Anatomical Location [x]  Arm []  Thigh []  Abdomen  Laterality []  Right [x]  Left   Were there any Adverse Events Experienced? Yes [x]    No[]   If yes, complete the following Description of event: ___TOE PAIN - GOUT__________________________________     _____________________________________   Is this AE related to an injection site Reaction Yes []    No[x]  If yes please provide information below Anatomical Location: ___________________ Laterality: ____________________________ Bruising: Yes []    No[]  Erythema: Yes []    No[]  Induration: Yes []    No[]  Lipodystrophy: Yes []    No[]  Necrosis: Yes []    No[]  Oedema: Yes []    No[]  Pain: Yes []     No[]  Pallor/Pigment Change: Yes []    No[]  Phlebitis: Yes []    No[]  Purtitus:   Yes []    No[]  Rash: Yes []    No[]  Tenderness:  Yes []    No[]  Ulceration: Yes []    No[]  Warmth: Yes []    No[]  Additional Symptoms: __________________  _____________________________________  Was the Adverse Event serious Yes []    No[x]   Serious Criteria   _____________________________________  Start Date ____/_____/_______  DD/MMM/YYYY  End Date ____/_____/_______  DD/MMM/YYYY  Outcome [x]  Resolved/Recovered []  Not Recovered/Not Resolved []  Fatal  Severity  [x]  Mild []  Moderate []  Severe  Relationship to Study Treatment   NO   ____________________________________________  Action Treatment with Study Treatment [x]  No Action Taken [] Dose Interrupted []  Drug Withdrawal []  Not Applicable []  Unknown  Was a concomitant or additional treatment given due to this adverse event Yes [x]    No[]  (if yes, add meds/procedures to list)  If AE led to study discontinuation select Yes Yes []    No[x]     Date of Treatment Phase Completion ____/_____/_______  DD/MMM/YYYY  If subject was lost to follow-up up  Subject Status []  Completed []  Screen Failure []  Adverse Event (subject prevented from getting study drug at Baseline. Only if the adverse event is considered an SAE, ensure it has been entered on the AE CRF and report to .  []  Death (only if occurred during screening phase) []  Lost to Follow-Up []  Physician Decision []  Pregnancy []  Progressive Disease [] Protocol Deviation []  Study Terminated by Sponsor []  Technical Problems []  Subject Decision []  COVID  Specify Decision (Physician Decision and Subject Decision)   _____________________________________  Does the Subject agree to further Follow up contact []   Yes   OR   []  NO  COVID-19 Relationship   _____________________________________  Were any Surgical/Medical Procedures reported since signing of Consent Yes []    No[x]     Surgery/Procedure: _____________________  Indication: ___________________________  Start Date: ____/_____/_______                     DD/MMM/YYYY Ongoing: Yes []    No[]   End Date: ____/_____/_______                    DD/MMM/YYYY   Has the subject been hospitalized since Qualifying Hospitalization Yes []    No[x]    Elective []  Planned []  Unplanned []  Date of Admission: ____/_____/_______                         DD/MMM/YYYY Reason for Admission:__________________ ____________________________________ _____________________________________  Was Subject Discharged: Yes []    No[]  Date of Discharge: ____/_____/_______                                DD/MMM/YYYY Discharge Disposition: _________________    Total number of days in intensive care unit or coronary care unit:________________   Has the subject had any emergency room visits (for less than 24 hours) Yes []    No[x]    Date of Visit: ____/_____/_______                         DD/MMM/YYYY Was Visit for an AE: Yes []    No[]  Reason for Visit:__________________ ____________________________________ _____________________________________ Was a concomitant or additional treatment given due to this visit: Yes []    No[]    Has the subject had any other clinical or healthcare professional consultations or visits Yes [x]    No[]    Professional Type: __URGENT CARE VISIT __________________  Date of Visit: _07_/_AUG_/_2023___                         DD/MMM/YYYY Was visit an Adverse Event: Yes [x]    No[]  Reason for Visit: ___TOE PAIN - GOUT __________________     Was a concomitant or additional treatment given due to this visit: Yes [x]    No[]     Outpatient Encounter Medications as of 04/26/2022  Medication Sig   Study - V-INCEPTION - inclisiran ) 284 mg/1.5 mL SQ injection (PI-Cooper) Inject 284 mg into the skin once. For Investigational Use Only. Inject subcutaneously into the abdomen, upper arm or thigh. Do not inject in areas  of active skin disease or injury, such as sunburns, skin rashes, inflammation or skin infections. Please contact -[facility] for any questions or concerns regarding this medication. Will receive injections at visit 2 (day 0), visit 3 (day 90), visit 4 (day 270)   albuterol (VENTOLIN HFA) 108 (90 Base) MCG/ACT inhaler Inhale 2 puffs into the lungs every 6 (six) hours as needed for wheezing or shortness of breath.   aspirin 81 MG EC tablet Take 1 tablet (81 mg total) by mouth daily. Swallow whole.   cholecalciferol (VITAMIN D3) 25 MCG (1000 UT) tablet Take 1,000 Units by mouth daily.   cinacalcet (SENSIPAR) 30 MG tablet Take 1 tablet (30 mg total) by mouth 2 (two) times daily with a meal.   clopidogrel (PLAVIX) 75 MG tablet Take 4 tablets (300mg  loading dose) by mouth once approximately  12 hours after last dose of Brilinta, then decrease to 1 tablet daily thereafter   nitroGLYCERIN (NITROSTAT) 0.4 MG SL tablet Place 1 tablet (0.4 mg total) under the tongue every 5 (five) minutes x 3 doses as needed for chest pain.   predniSONE (DELTASONE) 20 MG tablet Take 2 tablets (40 mg total) by mouth daily with breakfast.   tamsulosin (FLOMAX) 0.4 MG CAPS capsule Take 1 capsule (0.4 mg total) by mouth 2 (two) times daily.   venlafaxine XR (EFFEXOR-XR) 150 MG 24 hr capsule Take 1 capsule (150 mg total) by mouth daily.   [EXPIRED] Study - V-INCEPTION - inclisiran (IDP824) 284 mg/1.5 mL SQ injection (PI-Cooper)    No facility-administered encounter medications on file as of 04/26/2022.

## 2022-04-27 LAB — URINALYSIS, ROUTINE W REFLEX MICROSCOPIC
Bilirubin, UA: NEGATIVE
Glucose, UA: NEGATIVE
Ketones, UA: NEGATIVE
Nitrite, UA: NEGATIVE
Protein,UA: NEGATIVE
Specific Gravity, UA: 1.01 (ref 1.005–1.030)
Urobilinogen, Ur: 0.2 mg/dL (ref 0.2–1.0)
pH, UA: 6 (ref 5.0–7.5)

## 2022-04-27 LAB — MICROSCOPIC EXAMINATION
Bacteria, UA: NONE SEEN
Casts: NONE SEEN /lpf
Epithelial Cells (non renal): NONE SEEN /hpf (ref 0–10)

## 2022-05-01 ENCOUNTER — Ambulatory Visit
Admission: EM | Admit: 2022-05-01 | Discharge: 2022-05-01 | Disposition: A | Discharge status: Home / Self Care | Payer: Medicare Other | Attending: Nurse Practitioner | Admitting: Nurse Practitioner

## 2022-05-01 DIAGNOSIS — S20319A Abrasion of unspecified front wall of thorax, initial encounter: Secondary | ICD-10-CM | POA: Diagnosis not present

## 2022-05-01 DIAGNOSIS — S40021A Contusion of right upper arm, initial encounter: Secondary | ICD-10-CM | POA: Diagnosis not present

## 2022-05-01 DIAGNOSIS — M25531 Pain in right wrist: Secondary | ICD-10-CM | POA: Diagnosis not present

## 2022-05-01 NOTE — ED Provider Notes (Signed)
[facility]    CSN: [id number] Arrival date & time: 05/01/22  1201      History   Chief Complaint Chief Complaint  Patient presents with   Fall    HPI Billy Hunt is a 76 y.o. male.   Patient presents with significant other who is a nurse for fall that occurred about 3 hours ago.  Reports he was outside going up stairs when he lost his balance and fell into a bush and against railroad ties.  Reports he now has a large lump under the skin of his right forearm, his right wrist is little bit painful, as well as his right third finger.  He also scraped his right knee and has an abrasion to his chest.  Denies any loss of consciousness, hitting his head.  No dizziness or headache.  He takes Plavix daily.  Since the fall, he has been elevating and icing his right arm.  He cleaned his right knee abrasion with hydrogen peroxide and covered it with a large Band-Aid.  Significant other denies any change in the size of the hematoma since using ice and elevation; she also denies any color change of the fingertips.  Patient denies any numbness or tingling, decreased range of motion of the right upper extremity, wrist, fingers.  Denies any significant pain currently.    Past Medical History:  Diagnosis Date   Acute coronary syndrome ([place]) 07/04/2021   Anxiety    BPH (benign prostatic hyperplasia)    Gout    Hypercholesterolemia    Hyperparathyroidism ([place])    Hypertension    Pulmonary emboli ([place])    Vitamin D deficiency    Vitamin D deficiency disease 10/07/2019    Patient Active Problem List   Diagnosis Date Noted   Prediabetes 04/10/2022   Vitamin D deficiency 04/10/2022   Gross hematuria 12/27/2021   Incomplete bladder emptying 12/27/2021   Acute cystitis with hematuria 12/04/2021   Chronic cough 07/21/2021   Coronary artery disease involving native coronary artery of native heart with unstable angina pectoris [facility])    NSTEMI (non-ST elevated myocardial  infarction) ([place]) 07/04/2021   GAD (generalized anxiety disorder) 06/01/2021   Idiopathic peripheral neuropathy 06/01/2021   BPH with obstruction/lower urinary tract symptoms 10/07/2019   Hyperparathyroidism, primary ([place]) 11/20/2018   Osteopenia 11/20/2018   Anterior urethral stricture 06/22/2015   Elevated PSA 06/22/2015   Erectile dysfunction 06/22/2015   History of pulmonary embolus (PE) 04/21/2015   HTN (hypertension) 04/21/2015   Gout 04/21/2015   Mixed hyperlipidemia 04/21/2015    Past Surgical History:  Procedure Laterality Date   CORONARY STENT INTERVENTION N/A 07/04/2021   Procedure: CORONARY STENT INTERVENTION;  Surgeon: Nelva Bush, MD;  Location: [facility];  Service: Cardiovascular;  Laterality: N/A;   CORONARY STENT INTERVENTION N/A 07/06/2021   Procedure: CORONARY STENT INTERVENTION;  Surgeon: Troy Sine, MD;  Location: [facility];  Service: Cardiovascular;  Laterality: N/A;   LEFT HEART CATH AND CORONARY ANGIOGRAPHY N/A 07/04/2021   Procedure: LEFT HEART CATH AND CORONARY ANGIOGRAPHY;  Surgeon: Nelva Bush, MD;  Location: [facility];  Service: Cardiovascular;  Laterality: N/A;   PERIPHERAL VASCULAR THROMBECTOMY     TRANSURETHRAL RESECTION OF PROSTATE     VASECTOMY         Home Medications    Prior to Admission medications   Medication Sig Start Date End Date Taking? Authorizing Provider  albuterol (VENTOLIN HFA) 108 (90 Base) MCG/ACT inhaler Inhale 2 puffs  into the lungs every 6 (six) hours as needed for wheezing or shortness of breath. 07/21/21   Lindell Spar, MD  aspirin 81 MG EC tablet Take 1 tablet (81 mg total) by mouth daily. Swallow whole. 07/07/21   Cheryln Manly, NP  cholecalciferol (VITAMIN D3) 25 MCG (1000 UT) tablet Take 1,000 Units by mouth daily.    [provider]  cinacalcet (SENSIPAR) 30 MG tablet Take 1 tablet (30 mg total) by mouth 2 (two) times daily with a meal. 04/10/22   Nida,  Marella Chimes, MD  clopidogrel (PLAVIX) 75 MG tablet Take 4 tablets (365m loading dose) by mouth once approximately 12 hours after last dose of Brilinta, then decrease to 1 tablet daily thereafter 10/26/21   CElgie Collard PA-C  nitroGLYCERIN (NITROSTAT) 0.4 MG SL tablet Place 1 tablet (0.4 mg total) under the tongue every 5 (five) minutes x 3 doses as needed for chest pain. 07/06/21   RCheryln Manly NP  predniSONE (DELTASONE) 20 MG tablet Take 2 tablets (40 mg total) by mouth daily with breakfast. 04/20/22   PLindell Spar MD  Study - V-INCEPTION - inclisiran ([phone] 284 mg/1.5 mL SQ injection (PI-[place]) Inject 284 mg into the skin once. For Investigational Use Only. Inject subcutaneously into the abdomen, upper arm or thigh. Do not inject in areas of active skin disease or injury, such as sunburns, skin rashes, inflammation or skin infections. Please contact [facility] for any questions or concerns regarding this medication. Will receive injections at visit 2 (day 0), visit 3 (day 90), visit 4 (day 270)    CSherren Mocha MD  tamsulosin (FLOMAX) 0.4 MG CAPS capsule Take 1 capsule (0.4 mg total) by mouth 2 (two) times daily. 01/30/22   Stoneking, BReece Leader, MD  venlafaxine XR (EFFEXOR-XR) 150 MG 24 hr capsule Take 1 capsule (150 mg total) by mouth daily. 11/09/21   PLindell Spar MD    Family History Family History  Problem Relation Age of Onset   Alzheimer's disease Mother        Vascular Dementia   Stroke Mother    Breast cancer Mother    Cancer Father        Multiple Myeloma   Hypertension Father    Deep vein thrombosis Brother    Heart disease Brother    Cancer Paternal Grandmother    Alcohol abuse Paternal Grandfather     Social History Social History   Tobacco Use   Smoking status: Former    Packs/day: 1.00    Years: 10.00    Total pack years: 10.00    Types: Cigarettes    Quit date: 04/21/1971    Years since quitting: 51.0    Smokeless tobacco: Never  Vaping Use   Vaping Use: Never used  Substance Use Topics   Alcohol use: No    Comment: former   Drug use: Not Currently    Types: Marijuana    Comment: last use: 9/30     Allergies   Sulfa antibiotics and Sulfamethoxazole   Review of Systems Review of Systems Per HPI  Physical Exam Triage Vital Signs ED Triage Vitals  Enc Vitals Group     BP 05/01/22 1251 129/68     Pulse Rate 05/01/22 1251 60     Resp 05/01/22 1251 18     Temp 05/01/22 1251 (!) 97.4 F (36.3 C)     Temp src --      SpO2 05/01/22 1251 97 %  Weight --      Height --      Head Circumference --      Peak Flow --      Pain Score 05/01/22 1250 4     Pain Loc --      Pain Edu? --      Excl. in [place]? --    No data found.  Updated Vital Signs BP 129/68   Pulse 60   Temp (!) 97.4 F (36.3 C)   Resp 18   SpO2 97%   Visual Acuity Right Eye Distance:   Left Eye Distance:   Bilateral Distance:    Right Eye Near:   Left Eye Near:    Bilateral Near:     Physical Exam Vitals and nursing note reviewed.  Constitutional:      General: He is not in acute distress.    Appearance: Normal appearance. He is not toxic-appearing.  HENT:     Head: Normocephalic and atraumatic.     Mouth/Throat:     Mouth: Mucous membranes are moist.     Pharynx: Oropharynx is clear.  Pulmonary:     Effort: Pulmonary effort is normal. No respiratory distress.  Chest:    Musculoskeletal:     Right wrist: Normal. No swelling, deformity, effusion, tenderness, bony tenderness, snuff box tenderness or crepitus. Normal range of motion. Normal pulse.     Right hand: Normal. No swelling, tenderness or bony tenderness. Normal range of motion. Normal strength. Normal sensation. There is no disruption of two-point discrimination. Normal capillary refill. Normal pulse.       Arms:       Hands:       Legs:     Comments: Inspection: Hematoma and purpura to right pressure median areas marked;  there is also ecchymosis to the right middle digit.  No skin changes or swelling to right wrist.  No obvious deformity or redness to right upper extremity. Palpation: Right upper extremity nontender to palpation in all; no obvious deformities palpated ROM: Full ROM to right elbow, wrist, all 5 fingers, and hand. Strength: 5/5 right upper extremity Neurovascular: neurovascularly intact in right upper extremity   Skin:    General: Skin is warm and dry.     Capillary Refill: Capillary refill takes less than 2 seconds.     Coloration: Skin is not jaundiced or pale.     Findings: No erythema.  Neurological:     Mental Status: He is alert and oriented to person, place, and time.  Psychiatric:        Behavior: Behavior is cooperative.      UC Treatments / Results  Labs (all labs ordered are listed, but only abnormal results are displayed) Labs Reviewed - No data to display  EKG   Radiology No results found.  Procedures Procedures (including critical care time)  Medications Ordered in UC Medications - No data to display  Initial Impression / Assessment and Plan / UC Course  I have reviewed the triage vital signs and the nursing notes.  Pertinent labs & imaging results that were available during my care of the patient were reviewed by me and considered in my medical decision making (see chart for details).    Patient is a very pleasant, well-appearing 76 year old male presenting for contusion of right upper extremity, right wrist pain, and abrasion of chest wall.  In triage, vital signs are stable, he is normotensive, not tachycardic, oxygenating well on room air, and afebrile.  Patient has full  range of motion of right upper extremity including right elbow, forearm, wrist, hand, and all fingers.  He denies any pain currently.  He is neurovascularly intact in the right upper extremity.  No signs or symptoms of compartment syndrome.  Discussed rest, ice, elevation, and compression as  needed for the hematoma and swelling.  Wound care provided to the abrasions on his chest and knee.  Tdap has been updated in the past 5 years.  ER precautions discussed.  Patient and wife verbalized understanding.  The patient was given the opportunity to ask questions.  All questions answered to their satisfaction.  The patient is in agreement to this plan.   Final Clinical Impressions(s) / UC Diagnoses   Final diagnoses:  Contusion of right upper extremity, initial encounter  Right wrist pain  Abrasion of chest wall, unspecified laterality, initial encounter     Discharge Instructions      - Continue to ice and elevate your right arm.  Watch for excessive bruising, color change in your hand or fingertips, decreased pulse, increase in pain.  With these symptoms, seek emergent care. -Tdap was updated earlier this year -Continue good wound care twice daily to the chest and knee abrasions   ED Prescriptions   None    PDMP not reviewed this encounter.   Eulogio Bear, NP 05/01/22 1348

## 2022-05-01 NOTE — Progress Notes (Signed)
V-inception patient 003   Are there any labs that are clinically significant?  Yes [ ]  OR No[ ] 

## 2022-05-01 NOTE — Discharge Instructions (Signed)
-   Continue to ice and elevate your right arm.  Watch for excessive bruising, color change in your hand or fingertips, decreased pulse, increase in pain.  With these symptoms, seek emergent care. -Tdap was updated earlier this year -Continue good wound care twice daily to the chest and knee abrasions

## 2022-05-01 NOTE — Research (Signed)
V4 labs  Are there any labs that are clinically significant?  Yes []  OR No[] 

## 2022-05-01 NOTE — ED Triage Notes (Signed)
Pt presents with right arm swelling from fall this am, has large hematoma to right forearm  and some wrist pain

## 2022-05-02 ENCOUNTER — Encounter: Payer: Self-pay | Admitting: Urology

## 2022-05-02 ENCOUNTER — Ambulatory Visit (INDEPENDENT_AMBULATORY_CARE_PROVIDER_SITE_OTHER): Payer: Medicare Other | Admitting: Urology

## 2022-05-02 VITALS — BP 145/77 | HR 60 | Ht 69.0 in | Wt 166.0 lb

## 2022-05-02 DIAGNOSIS — N138 Other obstructive and reflux uropathy: Secondary | ICD-10-CM | POA: Diagnosis not present

## 2022-05-02 DIAGNOSIS — R339 Retention of urine, unspecified: Secondary | ICD-10-CM

## 2022-05-02 DIAGNOSIS — R310 Gross hematuria: Secondary | ICD-10-CM | POA: Diagnosis not present

## 2022-05-02 DIAGNOSIS — R972 Elevated prostate specific antigen [PSA]: Secondary | ICD-10-CM

## 2022-05-02 DIAGNOSIS — N401 Enlarged prostate with lower urinary tract symptoms: Secondary | ICD-10-CM

## 2022-05-02 LAB — URINALYSIS, ROUTINE W REFLEX MICROSCOPIC
Bilirubin, UA: NEGATIVE
Glucose, UA: NEGATIVE
Ketones, UA: NEGATIVE
Leukocytes,UA: NEGATIVE
Nitrite, UA: NEGATIVE
Protein,UA: NEGATIVE
Specific Gravity, UA: 1.01 (ref 1.005–1.030)
Urobilinogen, Ur: 0.2 mg/dL (ref 0.2–1.0)
pH, UA: 5.5 (ref 5.0–7.5)

## 2022-05-02 LAB — MICROSCOPIC EXAMINATION
Bacteria, UA: NONE SEEN
Renal Epithel, UA: NONE SEEN /hpf
WBC, UA: NONE SEEN /hpf (ref 0–5)

## 2022-05-02 NOTE — Progress Notes (Signed)
Assessment: 1. Gross hematuria; negative evaluation 4/23   2. BPH with obstruction/lower urinary tract symptoms; s/p TURP 2017   3. Incomplete bladder emptying   4. Elevated PSA     Plan: Continue tamsulosin 0.4 mg twice daily. Return to office in 3 months Free and total PSA next visit.  Chief Complaint:  Chief Complaint  Patient presents with   Hematuria   Benign Prostatic Hypertrophy    History of Present Illness:  Billy Hunt is a 76 y.o. year old male who is seen for further evaluation of gross hematuria, BPH with obstruction, and elevated PSA.   He noted onset of gross hematuria with clots on 12/01/2021.  He did have associated frequency and some slight dysuria.  Urinalysis from 12/01/2021 showed large blood, large leukocytes and positive nitrite.  A urine culture showed no growth.  His urine visibly cleared.  He reported no significant change in his symptoms prior to the onset of hematuria.  He has been managed with Flomax for a number of years.  No prior episodes of gross hematuria. IPSS = 14. PVR = 331 ml.  He has a history of BPH with obstruction and is status post a TURP in 4/17 by Dr. Odis Luster at [facility]. He does have a history of urethral stricture disease undergoing dilation when in college.  He also has a history of elevated PSA and has undergone 2 prior negative prostate biopsies. PSA from 02/06/2013: 5.65  CT hematuria protocol from 12/09/2021 demonstrated a punctate nonobstructive calculus in the superior pole of the right kidney, no renal masses or filling defects, and no evidence of obstruction.  At his visit in 4/23, he had no further gross hematuria.  No dysuria or flank pain.  He continued on tamsulosin. IPSS = 8. He was evaluated with cystoscopy in 4/23 which showed regrowth of the prostate adenoma at the bladder neck, increased vascularity in the prostatic urethra, and bladder trabeculations with cellules. Urine cytology was negative for  malignancy.  PSA from 4/23: 7.6 with 25% free  At his visit in May 2023, he reported 1 episode of gross hematuria associated with strenuous activity.  His urinary symptoms remained very stable.  He was having nocturia 1-2 times.  No dysuria. He continues on tamsulosin 0.4 mg daily. IPSS = 8.  PVR = 261 ml.  He returns today for follow-up.  He has had no further episodes of gross hematuria since his visit in May 2023.  He has noted improvement in his urinary symptoms with the increased dose of tamsulosin.  He feels like he is emptying well.  He continues with nocturia 1-2 times.  No dysuria or flank pain. IPSS = 3 today.  Portions of the above documentation were copied from a prior visit for review purposes only.   Past Medical History:  Past Medical History:  Diagnosis Date   Acute coronary syndrome ([place]) 07/04/2021   Anxiety    BPH (benign prostatic hyperplasia)    Gout    Hypercholesterolemia    Hyperparathyroidism ([place])    Hypertension    Pulmonary emboli ([place])    Vitamin D deficiency    Vitamin D deficiency disease 10/07/2019    Past Surgical History:  Past Surgical History:  Procedure Laterality Date   CORONARY STENT INTERVENTION N/A 07/04/2021   Procedure: CORONARY STENT INTERVENTION;  Surgeon: Nelva Bush, MD;  Location: [facility];  Service: Cardiovascular;  Laterality: N/A;   CORONARY STENT INTERVENTION N/A 07/06/2021   Procedure: CORONARY STENT  INTERVENTION;  Surgeon: Troy Sine, MD;  Location: [facility];  Service: Cardiovascular;  Laterality: N/A;   LEFT HEART CATH AND CORONARY ANGIOGRAPHY N/A 07/04/2021   Procedure: LEFT HEART CATH AND CORONARY ANGIOGRAPHY;  Surgeon: Nelva Bush, MD;  Location: [facility];  Service: Cardiovascular;  Laterality: N/A;   PERIPHERAL VASCULAR THROMBECTOMY     TRANSURETHRAL RESECTION OF PROSTATE     VASECTOMY      Allergies:  Allergies  Allergen Reactions   Sulfa Antibiotics Hives    Sulfamethoxazole Rash    Family History:  Family History  Problem Relation Age of Onset   Alzheimer's disease Mother        Vascular Dementia   Stroke Mother    Breast cancer Mother    Cancer Father        Multiple Myeloma   Hypertension Father    Deep vein thrombosis Brother    Heart disease Brother    Cancer Paternal Grandmother    Alcohol abuse Paternal Grandfather     Social History:  Social History   Tobacco Use   Smoking status: Former    Packs/day: 1.00    Years: 10.00    Total pack years: 10.00    Types: Cigarettes    Quit date: 04/21/1971    Years since quitting: 51.0   Smokeless tobacco: Never  Vaping Use   Vaping Use: Never used  Substance Use Topics   Alcohol use: No    Comment: former   Drug use: Not Currently    Types: Marijuana    Comment: last use: 9/30    ROS: Constitutional:  Negative for fever, chills, weight loss CV: Negative for chest pain, previous MI, hypertension Respiratory:  Negative for shortness of breath, wheezing, sleep apnea, frequent cough GI:  Negative for nausea, vomiting, bloody stool, GERD  Physical exam: BP (!) 145/77   Pulse 60   Ht _0  (1.753 m)   Wt 166 lb (75.3 kg)   BMI 24.51 kg/m  GENERAL APPEARANCE:  Well appearing, well developed, well nourished, NAD HEENT:  Atraumatic, normocephalic, oropharynx clear NECK:  Supple without lymphadenopathy or thyromegaly ABDOMEN:  Soft, non-tender, no masses EXTREMITIES:  Moves all extremities well, without clubbing, cyanosis, or edema NEUROLOGIC:  Alert and oriented x 3, normal gait, CN II-XII grossly intact MENTAL STATUS:  appropriate BACK:  Non-tender to palpation, No CVAT SKIN:  Warm, dry, and intact   Results: U/A: 3-10 RBCs

## 2022-05-04 ENCOUNTER — Telehealth: Payer: Medicare Other

## 2022-05-10 DIAGNOSIS — I10 Essential (primary) hypertension: Secondary | ICD-10-CM | POA: Diagnosis not present

## 2022-05-10 DIAGNOSIS — E210 Primary hyperparathyroidism: Secondary | ICD-10-CM | POA: Diagnosis not present

## 2022-05-10 DIAGNOSIS — I25110 Atherosclerotic heart disease of native coronary artery with unstable angina pectoris: Secondary | ICD-10-CM | POA: Diagnosis not present

## 2022-05-10 DIAGNOSIS — Z1159 Encounter for screening for other viral diseases: Secondary | ICD-10-CM | POA: Diagnosis not present

## 2022-05-10 DIAGNOSIS — N401 Enlarged prostate with lower urinary tract symptoms: Secondary | ICD-10-CM | POA: Diagnosis not present

## 2022-05-10 DIAGNOSIS — R7303 Prediabetes: Secondary | ICD-10-CM | POA: Diagnosis not present

## 2022-05-10 DIAGNOSIS — E559 Vitamin D deficiency, unspecified: Secondary | ICD-10-CM | POA: Diagnosis not present

## 2022-05-10 DIAGNOSIS — R350 Frequency of micturition: Secondary | ICD-10-CM | POA: Diagnosis not present

## 2022-05-11 LAB — CBC WITH DIFFERENTIAL/PLATELET
Basophils Absolute: 0.1 10*3/uL (ref 0.0–0.2)
Basos: 2 %
EOS (ABSOLUTE): 0.1 10*3/uL (ref 0.0–0.4)
Eos: 3 %
Hematocrit: 45.5 % (ref 37.5–51.0)
Hemoglobin: 15 g/dL (ref 13.0–17.7)
Immature Grans (Abs): 0 10*3/uL (ref 0.0–0.1)
Immature Granulocytes: 0 %
Lymphocytes Absolute: 1.6 10*3/uL (ref 0.7–3.1)
Lymphs: 32 %
MCH: 30.2 pg (ref 26.6–33.0)
MCHC: 33 g/dL (ref 31.5–35.7)
MCV: 92 fL (ref 79–97)
Monocytes Absolute: 0.5 10*3/uL (ref 0.1–0.9)
Monocytes: 9 %
Neutrophils Absolute: 2.8 10*3/uL (ref 1.4–7.0)
Neutrophils: 54 %
Platelets: 214 10*3/uL (ref 150–450)
RBC: 4.97 x10E6/uL (ref 4.14–5.80)
RDW: 13 % (ref 11.6–15.4)
WBC: 5.1 10*3/uL (ref 3.4–10.8)

## 2022-05-11 LAB — HEMOGLOBIN A1C
Est. average glucose Bld gHb Est-mCnc: 114 mg/dL
Hgb A1c MFr Bld: 5.6 % (ref 4.8–5.6)

## 2022-05-11 LAB — CMP14+EGFR
ALT: 14 IU/L (ref 0–44)
AST: 21 IU/L (ref 0–40)
Albumin/Globulin Ratio: 1.7 (ref 1.2–2.2)
Albumin: 4.3 g/dL (ref 3.8–4.8)
Alkaline Phosphatase: 89 IU/L (ref 44–121)
BUN/Creatinine Ratio: 11 (ref 10–24)
BUN: 12 mg/dL (ref 8–27)
Bilirubin Total: 0.5 mg/dL (ref 0.0–1.2)
CO2: 24 mmol/L (ref 20–29)
Calcium: 9.7 mg/dL (ref 8.6–10.2)
Chloride: 105 mmol/L (ref 96–106)
Creatinine, Ser: 1.06 mg/dL (ref 0.76–1.27)
Globulin, Total: 2.5 g/dL (ref 1.5–4.5)
Glucose: 95 mg/dL (ref 70–99)
Potassium: 4.8 mmol/L (ref 3.5–5.2)
Sodium: 144 mmol/L (ref 134–144)
Total Protein: 6.8 g/dL (ref 6.0–8.5)
eGFR: 74 mL/min/{1.73_m2} (ref >59)

## 2022-05-11 LAB — PSA: Prostate Specific Ag, Serum: 6.2 ng/mL — ABNORMAL HIGH (ref 0.0–4.0)

## 2022-05-11 LAB — VITAMIN D 25 HYDROXY (VIT D DEFICIENCY, FRACTURES): Vit D, 25-Hydroxy: 50.6 ng/mL (ref 30.0–100.0)

## 2022-05-11 LAB — TSH: TSH: 1.91 u[IU]/mL (ref 0.450–4.500)

## 2022-05-11 LAB — HEPATITIS C ANTIBODY: Hep C Virus Ab: NONREACTIVE

## 2022-05-12 ENCOUNTER — Ambulatory Visit: Payer: Medicare Other | Admitting: Internal Medicine

## 2022-05-16 ENCOUNTER — Encounter: Payer: Self-pay | Admitting: Internal Medicine

## 2022-05-16 ENCOUNTER — Ambulatory Visit (INDEPENDENT_AMBULATORY_CARE_PROVIDER_SITE_OTHER): Payer: Medicare Other | Admitting: Internal Medicine

## 2022-05-16 VITALS — BP 118/72 | HR 61 | Resp 18 | Ht 69.0 in | Wt 170.4 lb

## 2022-05-16 DIAGNOSIS — R972 Elevated prostate specific antigen [PSA]: Secondary | ICD-10-CM | POA: Diagnosis not present

## 2022-05-16 DIAGNOSIS — E782 Mixed hyperlipidemia: Secondary | ICD-10-CM | POA: Diagnosis not present

## 2022-05-16 DIAGNOSIS — E210 Primary hyperparathyroidism: Secondary | ICD-10-CM

## 2022-05-16 DIAGNOSIS — F411 Generalized anxiety disorder: Secondary | ICD-10-CM | POA: Diagnosis not present

## 2022-05-16 DIAGNOSIS — I10 Essential (primary) hypertension: Secondary | ICD-10-CM | POA: Diagnosis not present

## 2022-05-16 DIAGNOSIS — M1A9XX0 Chronic gout, unspecified, without tophus (tophi): Secondary | ICD-10-CM | POA: Diagnosis not present

## 2022-05-16 DIAGNOSIS — T148XXA Other injury of unspecified body region, initial encounter: Secondary | ICD-10-CM

## 2022-05-16 DIAGNOSIS — I25110 Atherosclerotic heart disease of native coronary artery with unstable angina pectoris: Secondary | ICD-10-CM | POA: Diagnosis not present

## 2022-05-16 NOTE — Assessment & Plan Note (Signed)
Acute flareup resolved Did not have flareup since his 40s till recently Uric acid: 7.6 If recurrent gout flareup, will add Allopurinol

## 2022-05-16 NOTE — Assessment & Plan Note (Signed)
BP Readings from Last 1 Encounters:  05/16/22 118/72   Well-controlled Discontinued Losartan and Coreg due to low BP at home Advised DASH diet and moderate exercise/walking, at least 150 mins/week

## 2022-05-16 NOTE — Assessment & Plan Note (Signed)
Followed by urology Has had TURP On Flomax for BPH

## 2022-05-16 NOTE — Patient Instructions (Signed)
Please continue taking medications as prescribed.  Please continue to follow low salt diet and perform moderate exercise/walking at least 150 mins/week. 

## 2022-05-16 NOTE — Assessment & Plan Note (Signed)
S/p PCI X 2 On Aspirin, Plavix and Leqvio No chest pain or dyspnea currently Followed by Cardiology 

## 2022-05-16 NOTE — Assessment & Plan Note (Signed)
Over bilateral UE From recent mechanical fall Recent urgent care visit note reviewed Has easy bruising due to DAPT

## 2022-05-16 NOTE — Assessment & Plan Note (Signed)
Had transaminitis with statin On Leqvio currently, in research trial Followed by Cardiology

## 2022-05-16 NOTE — Assessment & Plan Note (Signed)
Well-controlled with Effexor 

## 2022-05-16 NOTE — Progress Notes (Signed)
Established Patient Office Visit  Subjective:  Patient ID: Billy Hunt, male    DOB: 1945/11/05  Age: 76 y.o. MRN: [id number]  CC:  Chief Complaint  Patient presents with   Follow-up    6 month follow up Blood test review and CAD    HPI Billy Hunt is a 76 y.o. male with past medical history of CAD s/p stent placement, hyperparathyroidism, osteopenia, BPH, PE, HLD, gout and anxiety who presents for f/u of his chronic medical conditions.  CAD and HTN:  BP is well-controlled. Patient denies headache, dizziness, chest pain, dyspnea or palpitations. He is on Aspirin and Plavix currently. He is in research study with Leqvio for HLD and is tolerating it well. His liver enzymes were elevated with statin, which have trended down since stopping it.  BPH: Currently well-controlled with Flomax.  His urinary hesitancy has improved with Flomax twice daily.  His PSA was slightly lower compared to prior, followed by urology.  Denies any dysuria or hematuria currently.  Gout: His acute gout flareup has resolved now.  Hyperparathyroidism: He takes Cinacalcet for it.  His calcium level was WNL.    Past Medical History:  Diagnosis Date   Acute coronary syndrome ([place]) 07/04/2021   Anxiety    BPH (benign prostatic hyperplasia)    Gout    Hypercholesterolemia    Hyperparathyroidism ([place])    Hypertension    Pulmonary emboli ([place])    Vitamin D deficiency    Vitamin D deficiency disease 10/07/2019    Past Surgical History:  Procedure Laterality Date   CORONARY STENT INTERVENTION N/A 07/04/2021   Procedure: CORONARY STENT INTERVENTION;  Surgeon: Nelva Bush, MD;  Location: [facility];  Service: Cardiovascular;  Laterality: N/A;   CORONARY STENT INTERVENTION N/A 07/06/2021   Procedure: CORONARY STENT INTERVENTION;  Surgeon: Troy Sine, MD;  Location: [facility];  Service: Cardiovascular;  Laterality: N/A;   LEFT HEART CATH AND CORONARY ANGIOGRAPHY N/A  07/04/2021   Procedure: LEFT HEART CATH AND CORONARY ANGIOGRAPHY;  Surgeon: Nelva Bush, MD;  Location: [facility];  Service: Cardiovascular;  Laterality: N/A;   PERIPHERAL VASCULAR THROMBECTOMY     TRANSURETHRAL RESECTION OF PROSTATE     VASECTOMY      Family History  Problem Relation Age of Onset   Alzheimer's disease Mother        Vascular Dementia   Stroke Mother    Breast cancer Mother    Cancer Father        Multiple Myeloma   Hypertension Father    Deep vein thrombosis Brother    Heart disease Brother    Cancer Paternal Grandmother    Alcohol abuse Paternal Grandfather     Social History   Socioeconomic History   Marital status: Divorced    Spouse name: Not on file   Number of children: 2   Years of education: 12   Highest education level: Master's degree (e.g., MA, MS, MEng, MEd, MSW, MBA)  Occupational History   Not on file  Tobacco Use   Smoking status: Former    Packs/day: 1.00    Years: 10.00    Total pack years: 10.00    Types: Cigarettes    Quit date: 04/21/1971    Years since quitting: 51.1   Smokeless tobacco: Never  Vaping Use   Vaping Use: Never used  Substance and Sexual Activity   Alcohol use: No    Comment: former   Drug use: Not  Currently    Types: Marijuana    Comment: last use: 9/30   Sexual activity: Yes    Birth control/protection: Other-see comments  Other Topics Concern   Not on file  Social History Narrative   Not on file   Social Determinants of Health   Financial Resource Strain: Low Risk  (09/20/2021)   Overall Financial Resource Strain (CARDIA)    Difficulty of Paying Living Expenses: Not hard at all  Food Insecurity: No Food Insecurity (09/20/2021)   Hunger Vital Sign    Worried About Running Out of Food in the Last Year: Never true    Ran Out of Food in the Last Year: Never true  Transportation Needs: No Transportation Needs (09/20/2021)   PRAPARE - Hydrologist (Medical): No     Lack of Transportation (Non-Medical): No  Physical Activity: Sufficiently Active (09/20/2021)   Exercise Vital Sign    Days of Exercise per Week: 5 days    Minutes of Exercise per Session: 40 min  Stress: Stress Concern Present (04/21/2022)   [place]    Feeling of Stress : To some extent  Social Connections: Socially Isolated (04/21/2022)   Social Connection and Isolation Panel [NHANES]    Frequency of Communication with Friends and Family: Twice a week    Frequency of Social Gatherings with Friends and Family: Once a week    Attends Religious Services: Never    Marine scientist or Organizations: No    Attends Archivist Meetings: Never    Marital Status: Divorced  Human resources officer Violence: Unknown (09/20/2021)   Humiliation, Afraid, Rape, and Kick questionnaire    Fear of Current or Ex-Partner: No    Emotionally Abused: No    Physically Abused: Not on file    Sexually Abused: No    Outpatient Medications Prior to Visit  Medication Sig Dispense Refill   albuterol (VENTOLIN HFA) 108 (90 Base) MCG/ACT inhaler Inhale 2 puffs into the lungs every 6 (six) hours as needed for wheezing or shortness of breath. 8 g 0   aspirin 81 MG EC tablet Take 1 tablet (81 mg total) by mouth daily. Swallow whole. 90 tablet 1   cholecalciferol (VITAMIN D3) 25 MCG (1000 UT) tablet Take 1,000 Units by mouth daily.     cinacalcet (SENSIPAR) 30 MG tablet Take 1 tablet (30 mg total) by mouth 2 (two) times daily with a meal. 60 tablet 3   clopidogrel (PLAVIX) 75 MG tablet Take 4 tablets (351m loading dose) by mouth once approximately 12 hours after last dose of Brilinta, then decrease to 1 tablet daily thereafter 90 tablet 3   nitroGLYCERIN (NITROSTAT) 0.4 MG SL tablet Place 1 tablet (0.4 mg total) under the tongue every 5 (five) minutes x 3 doses as needed for chest pain. 25 tablet 2   Study - V-INCEPTION - inclisiran (([id number] 284  mg/1.5 mL SQ injection (PI-[place]) Inject 284 mg into the skin once. For Investigational Use Only. Inject subcutaneously into the abdomen, upper arm or thigh. Do not inject in areas of active skin disease or injury, such as sunburns, skin rashes, inflammation or skin infections. Please contact [facility] for any questions or concerns regarding this medication. Will receive injections at visit 2 (day 0), visit 3 (day 90), visit 4 (day 270)     tamsulosin (FLOMAX) 0.4 MG CAPS capsule Take 1 capsule (0.4 mg total) by mouth 2 (two)  times daily. 180 capsule 3   venlafaxine XR (EFFEXOR-XR) 150 MG 24 hr capsule Take 1 capsule (150 mg total) by mouth daily. 90 capsule 3   predniSONE (DELTASONE) 20 MG tablet Take 2 tablets (40 mg total) by mouth daily with breakfast. (Patient not taking: Reported on 05/16/2022) 10 tablet 0   No facility-administered medications prior to visit.    Allergies  Allergen Reactions   Sulfa Antibiotics Hives   Sulfamethoxazole Rash    ROS Review of Systems  Constitutional:  Negative for chills and fever.  HENT:  Negative for congestion and sore throat.   Eyes:  Negative for pain and discharge.  Respiratory:  Negative for cough and shortness of breath.   Cardiovascular:  Negative for chest pain and palpitations.  Gastrointestinal:  Negative for diarrhea, nausea and vomiting.  Endocrine: Negative for polydipsia and polyuria.  Genitourinary:  Negative for dysuria and hematuria.  Musculoskeletal:  Negative for neck pain and neck stiffness.  Skin:  Negative for rash.  Neurological:  Negative for dizziness, weakness and headaches.  Psychiatric/Behavioral:  Negative for agitation and behavioral problems.       Objective:    Physical Exam Vitals reviewed.  Constitutional:      General: He is not in acute distress.    Appearance: He is not diaphoretic.  HENT:     Head: Normocephalic and atraumatic.     Nose: Nose normal.      Mouth/Throat:     Mouth: Mucous membranes are moist.  Eyes:     General: No scleral icterus.    Extraocular Movements: Extraocular movements intact.  Cardiovascular:     Rate and Rhythm: Normal rate and regular rhythm.     Pulses: Normal pulses.     Heart sounds: Normal heart sounds. No murmur heard. Pulmonary:     Breath sounds: Normal breath sounds. No wheezing or rales.  Musculoskeletal:     Cervical back: Neck supple. No tenderness.     Right lower leg: No edema.     Left lower leg: No edema.  Skin:    General: Skin is warm.     Findings: Bruising (B/l UE) present. No rash.  Neurological:     General: No focal deficit present.     Mental Status: He is alert and oriented to person, place, and time.  Psychiatric:        Mood and Affect: Mood normal.        Behavior: Behavior normal.     BP 118/72 (BP Location: Right Arm, Patient Position: Sitting, Cuff Size: Normal)   Pulse 61   Resp 18   Ht 5' 9"  (1.753 m)   Wt 170 lb 6.4 oz (77.3 kg)   SpO2 97%   BMI 25.16 kg/m  Wt Readings from Last 3 Encounters:  05/16/22 170 lb 6.4 oz (77.3 kg)  05/02/22 166 lb (75.3 kg)  04/26/22 167 lb (75.8 kg)    Lab Results  Component Value Date   TSH 1.910 05/10/2022   Lab Results  Component Value Date   WBC 5.1 05/10/2022   HGB 15.0 05/10/2022   HCT 45.5 05/10/2022   MCV 92 05/10/2022   PLT 214 05/10/2022   Lab Results  Component Value Date   NA 144 05/10/2022   K 4.8 05/10/2022   CO2 24 05/10/2022   GLUCOSE 95 05/10/2022   BUN 12 05/10/2022   CREATININE 1.06 05/10/2022   BILITOT 0.5 05/10/2022   ALKPHOS 89 05/10/2022   AST 21 05/10/2022  ALT 14 05/10/2022   PROT 6.8 05/10/2022   ALBUMIN 4.3 05/10/2022   CALCIUM 9.7 05/10/2022   ANIONGAP 9 07/05/2021   EGFR 74 05/10/2022   Lab Results  Component Value Date   CHOL 97 (L) 09/28/2021   Lab Results  Component Value Date   HDL 49 09/28/2021   Lab Results  Component Value Date   LDLCALC 35 09/28/2021   Lab  Results  Component Value Date   TRIG 54 09/28/2021   Lab Results  Component Value Date   CHOLHDL 2.0 09/28/2021   Lab Results  Component Value Date   HGBA1C 5.6 05/10/2022      Assessment & Plan:   Problem List Items Addressed This Visit       Cardiovascular and Mediastinum   HTN (hypertension) - Primary    BP Readings from Last 1 Encounters:  05/16/22 118/72  Well-controlled Discontinued Losartan and Coreg due to low BP at home Advised DASH diet and moderate exercise/walking, at least 150 mins/week       Coronary artery disease involving native coronary artery of native heart with unstable angina pectoris ([place])    S/p PCI X 2 On Aspirin, Plavix and Leqvio No chest pain or dyspnea currently Followed by Cardiology        Endocrine   Hyperparathyroidism, primary ([place])    Stable On Cinacalcet Followed by Dr Dorris Fetch        Other   Gout    Acute flareup resolved Did not have flareup since his 65s till recently Uric acid: 7.6 If recurrent gout flareup, will add Allopurinol      Mixed hyperlipidemia    Had transaminitis with statin On Leqvio currently, in research trial Followed by Cardiology      GAD (generalized anxiety disorder)    Well-controlled with Effexor      Elevated PSA    Followed by urology Has had TURP On Flomax for BPH      Bruising    Over bilateral UE From recent mechanical fall Recent urgent care visit note reviewed Has easy bruising due to DAPT       No orders of the defined types were placed in this encounter.   Follow-up: Return in about 6 months (around 11/14/2022) for CAD and gout.    Lindell Spar, MD

## 2022-05-16 NOTE — Assessment & Plan Note (Signed)
Stable On Cinacalcet Followed by Dr Nida 

## 2022-06-04 DIAGNOSIS — Z23 Encounter for immunization: Secondary | ICD-10-CM | POA: Diagnosis not present

## 2022-06-24 DIAGNOSIS — Z23 Encounter for immunization: Secondary | ICD-10-CM | POA: Diagnosis not present

## 2022-06-27 ENCOUNTER — Encounter: Payer: Medicare Other | Admitting: *Deleted

## 2022-06-27 VITALS — BP 134/66 | HR 61 | Ht 69.0 in | Wt 165.0 lb

## 2022-06-27 DIAGNOSIS — Z006 Encounter for examination for normal comparison and control in clinical research program: Secondary | ICD-10-CM

## 2022-06-27 NOTE — Research (Cosign Needed Addendum)
V-inception V5  Vitals:   06/27/22 0925 06/27/22 0929 06/27/22 0932  BP: (!) 144/67 139/63 134/66  Pulse: 62 64 61  Height: 5\' 9"  (1.753 m)    Weight: 165 lb (74.8 kg)    BMI (Calculated): 24.36      Current Outpatient Medications:    aspirin 81 MG EC tablet, Take 1 tablet (81 mg total) by mouth daily. Swallow whole., Disp: 90 tablet, Rfl: 1   cholecalciferol (VITAMIN D3) 25 MCG (1000 UT) tablet, Take 1,000 Units by mouth daily., Disp: , Rfl:    cinacalcet (SENSIPAR) 30 MG tablet, Take 1 tablet (30 mg total) by mouth 2 (two) times daily with a meal., Disp: 60 tablet, Rfl: 3   clopidogrel (PLAVIX) 75 MG tablet, Take 4 tablets (300mg  loading dose) by mouth once approximately 12 hours after last dose of Brilinta, then decrease to 1 tablet daily thereafter, Disp: 90 tablet, Rfl: 3   nitroGLYCERIN (NITROSTAT) 0.4 MG SL tablet, Place 1 tablet (0.4 mg total) under the tongue every 5 (five) minutes x 3 doses as needed for chest pain., Disp: 25 tablet, Rfl: 2   tamsulosin (FLOMAX) 0.4 MG CAPS capsule, Take 1 capsule (0.4 mg total) by mouth 2 (two) times daily., Disp: 180 capsule, Rfl: 3   venlafaxine XR (EFFEXOR-XR) 150 MG 24 hr capsule, Take 1 capsule (150 mg total) by mouth daily., Disp: 90 capsule, Rfl: 3   albuterol (VENTOLIN HFA) 108 (90 Base) MCG/ACT inhaler, Inhale 2 puffs into the lungs every 6 (six) hours as needed for wheezing or shortness of breath. (Patient not taking: Reported on 06/27/2022), Disp: 8 g, Rfl: 0   Lab completed and sent out.  He follows with Dr Harrington Challenger in December  Dr Harrington Challenger has requested for him to see the lipid pharm d to decided what his next step is for him in lipid control.   Patient was in Urgent care May 01, 2022 for a fall.

## 2022-06-30 NOTE — Research (Signed)
Please review for any clinically significant labs:  EOS labs

## 2022-07-10 ENCOUNTER — Telehealth: Payer: Self-pay | Admitting: Pharmacist

## 2022-07-10 DIAGNOSIS — E210 Primary hyperparathyroidism: Secondary | ICD-10-CM | POA: Diagnosis not present

## 2022-07-10 DIAGNOSIS — E782 Mixed hyperlipidemia: Secondary | ICD-10-CM | POA: Diagnosis not present

## 2022-07-10 DIAGNOSIS — I214 Non-ST elevation (NSTEMI) myocardial infarction: Secondary | ICD-10-CM

## 2022-07-10 NOTE — Telephone Encounter (Signed)
Spoke with patient. He has medicare with a supplement F plan. He wishes to proceed with continuing Leqvio therapy.  Portal form submitted.

## 2022-07-10 NOTE — Telephone Encounter (Signed)
-----   Message from Patton Salles, RN sent at 07/10/2022 10:26 AM EDT ----- Regarding: FW: lipid control Just following up about this patient. I dont want him to get missed. :)  ----- Message ----- From: Patton Salles, RN Sent: 06/27/2022  11:21 AM EDT To: Leeroy Bock, RPH-CPP; # Subject: lipid control                                  Good Morning   I am reaching out for this patient to be seen by yall per Dr Harrington Challenger request. He was in the inclisarin trial and has completed his last visit with [place].  We have checked his lipids today and should have results back in 2-3 days. His liver enzymes shot up when he was on a statin so he has only been on inclisarin.  His last injection of inclisarin was Aug 16 I told patient to be expecting your call.   Thank Joelene Millin :)

## 2022-07-11 ENCOUNTER — Ambulatory Visit: Payer: Medicare Other | Admitting: "Endocrinology

## 2022-07-11 LAB — PTH, INTACT AND CALCIUM
Calcium: 9.6 mg/dL (ref 8.6–10.2)
PTH: 49 pg/mL (ref 15–65)

## 2022-07-11 LAB — LIPID PANEL
Chol/HDL Ratio: 2.9 ratio (ref 0.0–5.0)
Cholesterol, Total: 171 mg/dL (ref 100–199)
HDL: 58 mg/dL (ref >39)
LDL Chol Calc (NIH): 99 mg/dL (ref 0–99)
Triglycerides: 76 mg/dL (ref 0–149)
VLDL Cholesterol Cal: 14 mg/dL (ref 5–40)

## 2022-07-11 LAB — VITAMIN D 25 HYDROXY (VIT D DEFICIENCY, FRACTURES): Vit D, 25-Hydroxy: 53.9 ng/mL (ref 30.0–100.0)

## 2022-07-13 ENCOUNTER — Ambulatory Visit (INDEPENDENT_AMBULATORY_CARE_PROVIDER_SITE_OTHER): Payer: Medicare Other | Admitting: "Endocrinology

## 2022-07-13 ENCOUNTER — Encounter: Payer: Self-pay | Admitting: "Endocrinology

## 2022-07-13 VITALS — BP 128/70 | HR 60 | Ht 69.0 in | Wt 166.0 lb

## 2022-07-13 DIAGNOSIS — E782 Mixed hyperlipidemia: Secondary | ICD-10-CM

## 2022-07-13 DIAGNOSIS — I25110 Atherosclerotic heart disease of native coronary artery with unstable angina pectoris: Secondary | ICD-10-CM | POA: Diagnosis not present

## 2022-07-13 DIAGNOSIS — E210 Primary hyperparathyroidism: Secondary | ICD-10-CM

## 2022-07-13 DIAGNOSIS — M8580 Other specified disorders of bone density and structure, unspecified site: Secondary | ICD-10-CM

## 2022-07-13 DIAGNOSIS — R7303 Prediabetes: Secondary | ICD-10-CM

## 2022-07-13 NOTE — Progress Notes (Signed)
07/13/2022, 6:07 PM  Endocrinology follow-up note   Billy Hunt is a 76 y.o.-year-old male, who is returning for follow-up after he was seen in consultation for hypercalcemia/hyperparathyroidism, osteopenia. PMD:   Lindell Spar, MD   Past Medical History:  Diagnosis Date   Acute coronary syndrome ([place]) 07/04/2021   Anxiety    BPH (benign prostatic hyperplasia)    Gout    Hypercholesterolemia    Hyperparathyroidism ([place])    Hypertension    Pulmonary emboli ([place])    Vitamin D deficiency    Vitamin D deficiency disease 10/07/2019    Past Surgical History:  Procedure Laterality Date   CORONARY STENT INTERVENTION N/A 07/04/2021   Procedure: CORONARY STENT INTERVENTION;  Surgeon: Nelva Bush, MD;  Location: [facility];  Service: Cardiovascular;  Laterality: N/A;   CORONARY STENT INTERVENTION N/A 07/06/2021   Procedure: CORONARY STENT INTERVENTION;  Surgeon: Troy Sine, MD;  Location: [facility];  Service: Cardiovascular;  Laterality: N/A;   LEFT HEART CATH AND CORONARY ANGIOGRAPHY N/A 07/04/2021   Procedure: LEFT HEART CATH AND CORONARY ANGIOGRAPHY;  Surgeon: Nelva Bush, MD;  Location: [facility];  Service: Cardiovascular;  Laterality: N/A;   PERIPHERAL VASCULAR THROMBECTOMY     TRANSURETHRAL RESECTION OF PROSTATE     VASECTOMY      Social History   Tobacco Use   Smoking status: Former    Packs/day: 1.00    Years: 10.00    Total pack years: 10.00    Types: Cigarettes    Quit date: 04/21/1971    Years since quitting: 51.2   Smokeless tobacco: Never  Vaping Use   Vaping Use: Never used  Substance Use Topics   Alcohol use: No    Comment: former   Drug use: Not Currently    Types: Marijuana    Comment: last use: 9/30    Family History  Problem Relation Age of Onset   Alzheimer's disease Mother        Vascular Dementia   Stroke Mother    Breast cancer Mother    Cancer Father        Multiple Myeloma    Hypertension Father    Deep vein thrombosis Brother    Heart disease Brother    Cancer Paternal Grandmother    Alcohol abuse Paternal Grandfather     Outpatient Encounter Medications as of 07/13/2022  Medication Sig   albuterol (VENTOLIN HFA) 108 (90 Base) MCG/ACT inhaler Inhale 2 puffs into the lungs every 6 (six) hours as needed for wheezing or shortness of breath. (Patient not taking: Reported on 06/27/2022)   aspirin 81 MG EC tablet Take 1 tablet (81 mg total) by mouth daily. Swallow whole.   cholecalciferol (VITAMIN D3) 25 MCG (1000 UT) tablet Take 1,000 Units by mouth daily.   cinacalcet (SENSIPAR) 30 MG tablet Take 1 tablet (30 mg total) by mouth 2 (two) times daily with a meal.   clopidogrel (PLAVIX) 75 MG tablet Take 4 tablets (352m loading dose) by mouth once approximately 12 hours after last dose of Brilinta, then decrease to 1 tablet daily thereafter   nitroGLYCERIN (NITROSTAT) 0.4 MG SL tablet Place 1 tablet (0.4 mg total) under the tongue every 5 (five) minutes x 3 doses as needed for chest pain.   tamsulosin (FLOMAX) 0.4 MG CAPS capsule Take 1 capsule (0.4 mg total) by mouth 2 (two) times daily.   venlafaxine XR (EFFEXOR-XR) 150 MG 24 hr  capsule Take 1 capsule (150 mg total) by mouth daily.   No facility-administered encounter medications on file as of 07/13/2022.    Allergies  Allergen Reactions   Sulfa Antibiotics Hives   Sulfamethoxazole Rash     HPI  ALTUS ZAINO reports that he was diagnosed with hypercalcemia approximately 7 years ago with blood test showing high PTH and high calcium.  He denies any history of nephrolithiasis, seizure disorder, cardiac dysrhythmias.  Denies any history of CKD.   -He however was found to have osteopenia in 2014.  His repeat bone density in [facility] on November 25, 2018 showed left femur osteopenia with a T score of -1.6, left forearm radius 33% T score of -0.2, spine excluded due to degenerative changes.   His most  recent bone density on November 26, 2020 shows similar findings still consistent with osteopenia.  -He is currently not on calcium supplements, however on vitamin D3 2000 units daily.  His previsit labs show vitamin D replete at  53.9.   -He denies any family history of parathyroid, thyroid, pituitary, adrenal dysfunction.   -He returns with controlled calcium at 9.6.  This is associated with improving PTH of 49.   He is currently on Sensipar 30 mg p.o. twice daily.  - prior to his last visit, 24-hour urine calcium on November 22, 2018 was 221. He has no new complaints today.  No prior history of fragility fractures or falls.    He is a former  smoker.   he is not on HCTZ or other thiazide therapy.    His previsit labs show significant dyslipidemia, currently patient not on treatment.  He reports previous intolerance to Lipitor.   BP 128/70   Pulse 60   Ht _0  (1.753 m)   Wt 166 lb (75.3 kg)   BMI 24.51 kg/m , Body mass index is 24.51 kg/m.   CMP     Component Value Date/Time   NA 144 05/10/2022 0820   K 4.8 05/10/2022 0820   CL 105 05/10/2022 0820   CO2 24 05/10/2022 0820   GLUCOSE 95 05/10/2022 0820   GLUCOSE 137 (H) 07/05/2021 0320   BUN 12 05/10/2022 0820   CREATININE 1.06 05/10/2022 0820   CALCIUM 9.6 07/10/2022 0811   PROT 6.8 05/10/2022 0820   ALBUMIN 4.3 05/10/2022 0820   AST 21 05/10/2022 0820   ALT 14 05/10/2022 0820   ALKPHOS 89 05/10/2022 0820   BILITOT 0.5 05/10/2022 0820   GFRNONAA >60 07/05/2021 0320   GFRAA >60 07/27/2017 0100   Most recent official lab reports from September 17, 2018: 25-hydroxy vitamin D 43, PTH 76 elevated, calcium 11.4 mg per DL elevated, BUN 25, creatinine 0.98. -He came with separate handwritten summaries of his labs from 2014 showing PTH elevated between 89-1 67 between May and June 2014, 25 hydroxy vitamin D ranging between 8.6-27 between June and August 2014, phosphorus range from 2.2-2.3 between May and August 2014, 24-hour urine  calcium 192 mg in 24 hours in June 2014. DEXA scan showed mild osteopenia in June 2014.   Surveillance DEXA on November 25, 2018  DualFemur Neck Left 11/25/2018 72.4 N/A -1.6 0.868 g/cm2   Left Forearm Radius 33% 11/25/2018 70.4 N/A -0.2 0.787 g/cm2   ASSESSMENT: BMD as determined from Femur Neck Left is 0.868 g/cm2 with a T-Score of -1.6.   This patient is considered OSTEOPENIC according to the [facility]) criteria.    Repeat bone density on November 26, 2020: DualFemur Neck Left 11/26/2020 74.4 N/A -1.6 0.857 g/cm2 -1.3% - DualFemur Neck Left 11/25/2018 70.4 N/A -1.6 0.868 g/cm2 - -   DualFemur Total Mean 11/26/2020 74.4 N/A -1.3 0.914 g/cm2 -6.3% Yes DualFemur Total Mean 11/25/2018 70.4 N/A -0.9 0.975 g/cm2 - -   Left Forearm Radius 33% 11/26/2020 72.4 N/A -0.5 0.768 g/cm2 -2.3% - Left Forearm Radius 33% 11/25/2018 70.4 N/A -0.2 0.787 g/cm2 - -   ASSESSMENT: BMD as determined from Femur Neck Left is 0.857 g/cm2 with aT-score of -1.6.   This patient is considered OSTEOPENIC according to [facility]) criteria.  Recent Results (from the past 2160 hour(s))  Uric acid     Status: None   Collection Time: 04/17/22 10:10 AM  Result Value Ref Range   Uric Acid 7.6 3.8 - 8.4 mg/dL    Comment:            Therapeutic target for gout patients: <6.0  Urinalysis, Routine w reflex microscopic     Status: Abnormal   Collection Time: 04/26/22  9:30 AM  Result Value Ref Range   Specific Gravity, UA 1.010 1.005 - 1.030   pH, UA 6.0 5.0 - 7.5   Color, UA Yellow Yellow   Appearance Ur Clear Clear   Leukocytes,UA 1+ (A) Negative   Protein,UA Negative Negative/Trace   Glucose, UA Negative Negative   Ketones, UA Negative Negative   RBC, UA 2+ (A) Negative   Bilirubin, UA Negative Negative   Urobilinogen, Ur 0.2 0.2 - 1.0 mg/dL   Nitrite, UA Negative Negative   Microscopic Examination See below:     Comment: Microscopic was indicated and was  performed.  Microscopic Examination     Status: None   Collection Time: 04/26/22  9:30 AM   Urine  Result Value Ref Range   WBC, UA 0-5 0 - 5 /hpf   RBC, Urine 0-2 0 - 2 /hpf   Epithelial Cells (non renal) None seen 0 - 10 /hpf   Casts None seen None seen /lpf   Bacteria, UA None seen None seen/Few  Urinalysis, Routine w reflex microscopic     Status: Abnormal   Collection Time: 05/02/22 10:58 AM  Result Value Ref Range   Specific Gravity, UA 1.010 1.005 - 1.030   pH, UA 5.5 5.0 - 7.5   Color, UA Yellow Yellow   Appearance Ur Clear Clear   Leukocytes,UA Negative Negative   Protein,UA Negative Negative/Trace   Glucose, UA Negative Negative   Ketones, UA Negative Negative   RBC, UA 1+ (A) Negative   Bilirubin, UA Negative Negative   Urobilinogen, Ur 0.2 0.2 - 1.0 mg/dL   Nitrite, UA Negative Negative   Microscopic Examination See below:   Microscopic Examination     Status: Abnormal   Collection Time: 05/02/22 10:58 AM   Urine  Result Value Ref Range   WBC, UA None seen 0 - 5 /hpf   RBC, Urine 3-10 (A) 0 - 2 /hpf   Epithelial Cells (non renal) 0-10 0 - 10 /hpf   Renal Epithel, UA None seen None seen /hpf   Mucus, UA Present Not Estab.   Bacteria, UA None seen None seen/Few  VITAMIN D 25 Hydroxy (Vit-D Deficiency, Fractures)     Status: None   Collection Time: 05/10/22  8:20 AM  Result Value Ref Range   Vit D, 25-Hydroxy 50.6 30.0 - 100.0 ng/mL    Comment: Vitamin D deficiency has been defined by the Institute of Medicine  and an Endocrine Society practice guideline as a level of serum 25-OH vitamin D less than 20 ng/mL (1,2). The Endocrine Society went on to further define vitamin D insufficiency as a level between 21 and 29 ng/mL (2). 1. IOM (Institute of Medicine). 2010. Dietary reference    intakes for calcium and D. [place]: The    [employer]. 2. Holick MF, Binkley Hoskins, Bischoff-Ferrari HA, et al.    Evaluation, treatment, and prevention of vitamin  D    deficiency: an Endocrine Society clinical practice    guideline. JCEM. 2011 Jul; 96(7):1911-30.   TSH     Status: None   Collection Time: 05/10/22  8:20 AM  Result Value Ref Range   TSH 1.910 0.450 - 4.500 uIU/mL  PSA     Status: Abnormal   Collection Time: 05/10/22  8:20 AM  Result Value Ref Range   Prostate Specific Ag, Serum 6.2 (H) 0.0 - 4.0 ng/mL    Comment: Roche ECLIA methodology. According to the American Urological Association, Serum PSA should decrease and remain at undetectable levels after radical prostatectomy. The AUA defines biochemical recurrence as an initial PSA value 0.2 ng/mL or greater followed by a subsequent confirmatory PSA value 0.2 ng/mL or greater. Values obtained with different assay methods or kits cannot be used interchangeably. Results cannot be interpreted as absolute evidence of the presence or absence of malignant disease.   Hemoglobin A1c     Status: None   Collection Time: 05/10/22  8:20 AM  Result Value Ref Range   Hgb A1c MFr Bld 5.6 4.8 - 5.6 %    Comment:          Prediabetes: 5.7 - 6.4          Diabetes: >6.4          Glycemic control for adults with diabetes: <7.0    Est. average glucose Bld gHb Est-mCnc 114 mg/dL  CMP14+EGFR     Status: None   Collection Time: 05/10/22  8:20 AM  Result Value Ref Range   Glucose 95 70 - 99 mg/dL   BUN 12 8 - 27 mg/dL   Creatinine, Ser 1.06 0.76 - 1.27 mg/dL   eGFR 74 >59 mL/min/1.73   BUN/Creatinine Ratio 11 10 - 24   Sodium 144 134 - 144 mmol/L   Potassium 4.8 3.5 - 5.2 mmol/L   Chloride 105 96 - 106 mmol/L   CO2 24 20 - 29 mmol/L   Calcium 9.7 8.6 - 10.2 mg/dL   Total Protein 6.8 6.0 - 8.5 g/dL   Albumin 4.3 3.8 - 4.8 g/dL   Globulin, Total 2.5 1.5 - 4.5 g/dL   Albumin/Globulin Ratio 1.7 1.2 - 2.2   Bilirubin Total 0.5 0.0 - 1.2 mg/dL   Alkaline Phosphatase 89 44 - 121 IU/L   AST 21 0 - 40 IU/L   ALT 14 0 - 44 IU/L  CBC with Differential/Platelet     Status: None   Collection Time:  05/10/22  8:20 AM  Result Value Ref Range   WBC 5.1 3.4 - 10.8 x10E3/uL   RBC 4.97 4.14 - 5.80 x10E6/uL   Hemoglobin 15.0 13.0 - 17.7 g/dL   Hematocrit 45.5 37.5 - 51.0 %   MCV 92 79 - 97 fL   MCH 30.2 26.6 - 33.0 pg   MCHC 33.0 31.5 - 35.7 g/dL   RDW 13.0 11.6 - 15.4 %   Platelets 214 150 - 450 x10E3/uL   Neutrophils 54 Not Estab. %  Lymphs 32 Not Estab. %   Monocytes 9 Not Estab. %   Eos 3 Not Estab. %   Basos 2 Not Estab. %   Neutrophils Absolute 2.8 1.4 - 7.0 x10E3/uL   Lymphocytes Absolute 1.6 0.7 - 3.1 x10E3/uL   Monocytes Absolute 0.5 0.1 - 0.9 x10E3/uL   EOS (ABSOLUTE) 0.1 0.0 - 0.4 x10E3/uL   Basophils Absolute 0.1 0.0 - 0.2 x10E3/uL   Immature Granulocytes 0 Not Estab. %   Immature Grans (Abs) 0.0 0.0 - 0.1 x10E3/uL  Hepatitis C antibody     Status: None   Collection Time: 05/10/22  8:20 AM  Result Value Ref Range   Hep C Virus Ab Non Reactive Non Reactive    Comment: HCV antibody alone does not differentiate between previously resolved infection and active infection. Equivocal and Reactive HCV antibody results should be followed up with an HCV RNA test to support the diagnosis of active HCV infection.   PTH, intact and calcium     Status: None   Collection Time: 07/10/22  8:11 AM  Result Value Ref Range   Calcium 9.6 8.6 - 10.2 mg/dL   PTH 49 15 - 65 pg/mL   PTH Interp Comment     Comment: Interpretation                 Intact PTH    Calcium                                 (pg/mL)      (mg/dL) Normal                          15 - 65     8.6 - 10.2 Primary Hyperparathyroidism         >65          >10.2 Secondary Hyperparathyroidism       >65          <10.2 Non-Parathyroid Hypercalcemia       <65          >10.2 Hypoparathyroidism                  <15          < 8.6 Non-Parathyroid Hypocalcemia    15 - 65          < 8.6   VITAMIN D 25 Hydroxy (Vit-D Deficiency, Fractures)     Status: None   Collection Time: 07/10/22  8:11 AM  Result Value Ref Range   Vit D,  25-Hydroxy 53.9 30.0 - 100.0 ng/mL    Comment: Vitamin D deficiency has been defined by the [place] and an Endocrine Society practice guideline as a level of serum 25-OH vitamin D less than 20 ng/mL (1,2). The Endocrine Society went on to further define vitamin D insufficiency as a level between 21 and 29 ng/mL (2). 1. IOM (Institute of Medicine). 2010. Dietary reference    intakes for calcium and D. [place]: The    [employer]. 2. Holick MF, Binkley [place], Bischoff-Ferrari HA, et al.    Evaluation, treatment, and prevention of vitamin D    deficiency: an Endocrine Society clinical practice    guideline. JCEM. 2011 Jul; 96(7):1911-30.   Lipid panel     Status: None   Collection Time: 07/10/22  8:11 AM  Result Value Ref Range   Cholesterol, Total  171 100 - 199 mg/dL   Triglycerides 76 0 - 149 mg/dL   HDL 58 >39 mg/dL   VLDL Cholesterol Cal 14 5 - 40 mg/dL   LDL Chol Calc (NIH) 99 0 - 99 mg/dL   Chol/HDL Ratio 2.9 0.0 - 5.0 ratio    Comment:                                   T. Chol/HDL Ratio                                             Men  Women                               1/2 Avg.Risk  3.4    3.3                                   Avg.Risk  5.0    4.4                                2X Avg.Risk  9.6    7.1                                3X Avg.Risk 23.4   11.0     Assessment: 1. Hypercalcemia / Hyperparathyroidism 2.  Osteopenia 3.  Hyperlipidemia  Plan: His previsit repeat labs show improving PTH at 49, associated with calcium 9.6.  He is advised to continue Sensipar 30 mg p.o. twice daily with meals.    His prior bone density was consistent with osteopenia of femur.  His lumbar spine was excluded due to degenerative joint diseases. He is due for next bone density before next visit. His prior 24-hour urine calcium was 221.    This is still consistent with primary hyperparathyroidism at a mild form.  He does not have criteria for considering  surgery at this time.   -He remains on low-dose vitamin D supplement.  His current vitamin D supplements is with vitamin D3 1000 units daily, advised to continue.     From the point of view of hyperlipidemia on the background of coronary artery disease, he remains to be a good candidate for lifestyle medicine.  - he acknowledges that there is a room for improvement in his food and drink choices. - Suggestion is made for him to avoid simple carbohydrates  from his diet including Cakes, Sweet Desserts, Ice Cream, Soda (diet and regular), Sweet Tea, Candies, Chips, Cookies, Store Bought Juices, Alcohol , Artificial Sweeteners,  Coffee Creamer, and "Sugar-free" Products, Lemonade. This will help patient to have more stable blood glucose profile and potentially avoid unintended weight gain.  The following Lifestyle Medicine recommendations according to [facility]) were discussed and and offered to patient and he  agrees to start the journey:  A. Whole Foods, Plant-Based Nutrition comprising of fruits and vegetables, plant-based proteins, whole-grain carbohydrates was discussed in detail with the patient.   A list for source of those nutrients were also provided to  the patient.  Patient will use only water or unsweetened tea for hydration. B.  The need to stay away from risky substances including alcohol, smoking; obtaining 7 to 9 hours of restorative sleep, at least 150 minutes of moderate intensity exercise weekly, the importance of healthy social connections,  and stress management techniques were discussed. C.  A full color page of  Calorie density of various food groups per pound showing examples of each food groups was provided to the patient.   His next previsit labs will include fasting lipid panel, if LDL remains above 100, she will be considered for statin intervention.    -He is advised to continue his primary care follow-up with Dr. Posey Pronto.    I spent 32  minutes in the care of the patient today including review of labs from Thyroid Function, CMP, and other relevant labs ; imaging/biopsy records (current and previous including abstractions from other facilities); face-to-face time discussing  his lab results and symptoms, medications doses, his options of short and long term treatment based on the latest standards of care / guidelines;   and documenting the encounter. Risk reduction counseling performed per USPSTF guidelines to reduce obesity and cardiovascular risk factors.    Saunders Glance  participated in the discussions, expressed understanding, and voiced agreement with the above plans.  All questions were answered to his satisfaction. he is encouraged to contact clinic should he have any questions or concerns prior to his return visit.    - Return in about 5 months (around 12/12/2022) for F/U with Pre-visit Labs, DXA Scan B4 NV.   Glade Lloyd, MD [facility] [address] [place], [place] [address] Phone: [phone]  Fax: [phone]    This note was partially dictated with voice recognition software. Similar sounding words can be transcribed inadequately or may not  be corrected upon review.  07/13/2022, 6:07 PM

## 2022-07-25 NOTE — Telephone Encounter (Signed)
[facility] called stating they have not been able to get in contact with paitent. They have called and sent email.  I called pt and reminded him to click the link on the email to sign the HIPAA consent form. He said he will check his email. Advised for him to let us know if he didn't get it.

## 2022-08-01 ENCOUNTER — Other Ambulatory Visit: Payer: Self-pay | Admitting: Pharmacist

## 2022-08-01 ENCOUNTER — Telehealth: Payer: Self-pay | Admitting: Pharmacy Technician

## 2022-08-01 NOTE — Telephone Encounter (Signed)
Spoke with patient. Advised that his Billy Hunt is covered at 100% under medical insurance. He had his last injection 04/26/22. Not due until Feb for next injection. Referral to [facility] infusion center entered. Pt made aware.

## 2022-08-01 NOTE — Telephone Encounter (Addendum)
Auth Submission: NO AUTH NEEDED Payer: MEDICARE A/B & AARP SUPP Medication & CPT/J Code(s) submitted: [facility] ([place]) [phone] Route of submission (phone, fax, portal):  Phone # Fax # Auth type: Buy/Bill Units/visits requested: X2 Reference numberSusa Simmonds Ref: [id number] Approval from: 08/01/22 to 09/10/22   Reviewed medicare b and supplement coverage and extended auth approval to 09/11/23

## 2022-08-07 ENCOUNTER — Ambulatory Visit (INDEPENDENT_AMBULATORY_CARE_PROVIDER_SITE_OTHER): Payer: Medicare Other | Admitting: Urology

## 2022-08-07 ENCOUNTER — Encounter: Payer: Self-pay | Admitting: Urology

## 2022-08-07 VITALS — BP 165/78 | HR 64 | Ht 69.0 in | Wt 166.0 lb

## 2022-08-07 DIAGNOSIS — N138 Other obstructive and reflux uropathy: Secondary | ICD-10-CM

## 2022-08-07 DIAGNOSIS — N401 Enlarged prostate with lower urinary tract symptoms: Secondary | ICD-10-CM

## 2022-08-07 DIAGNOSIS — R339 Retention of urine, unspecified: Secondary | ICD-10-CM

## 2022-08-07 DIAGNOSIS — R310 Gross hematuria: Secondary | ICD-10-CM

## 2022-08-07 DIAGNOSIS — R972 Elevated prostate specific antigen [PSA]: Secondary | ICD-10-CM | POA: Diagnosis not present

## 2022-08-07 LAB — URINALYSIS, ROUTINE W REFLEX MICROSCOPIC
Bilirubin, UA: NEGATIVE
Glucose, UA: NEGATIVE
Ketones, UA: NEGATIVE
Nitrite, UA: NEGATIVE
Protein,UA: NEGATIVE
Specific Gravity, UA: 1.015 (ref 1.005–1.030)
Urobilinogen, Ur: 0.2 mg/dL (ref 0.2–1.0)
pH, UA: 6 (ref 5.0–7.5)

## 2022-08-07 LAB — MICROSCOPIC EXAMINATION: Bacteria, UA: NONE SEEN

## 2022-08-07 NOTE — Progress Notes (Signed)
Assessment: 1. Gross hematuria; negative evaluation 4/23   2. BPH with obstruction/lower urinary tract symptoms; s/p TURP 2017   3. Incomplete bladder emptying   4. Elevated PSA     Plan: Continue tamsulosin 0.4 mg twice daily. He did not want to repeat PSA testing today. Return to office in 3 months.  Chief Complaint:  Chief Complaint  Patient presents with   Hematuria   Elevated PSA   Benign Prostatic Hypertrophy    History of Present Illness:  Billy Hunt is a 76 y.o. year old male who is seen for further evaluation of gross hematuria, BPH with obstruction, and elevated PSA.   He noted onset of gross hematuria with clots on 12/01/2021.  He did have associated frequency and some slight dysuria.  Urinalysis from 12/01/2021 showed large blood, large leukocytes and positive nitrite.  A urine culture showed no growth.  His urine visibly cleared.  He reported no significant change in his symptoms prior to the onset of hematuria.  He has been managed with Flomax for a number of years.  No prior episodes of gross hematuria. IPSS = 14. PVR = 331 ml.  He has a history of BPH with obstruction and is status post a TURP in 4/17 by Dr. Odis Luster at [facility]. He does have a history of urethral stricture disease undergoing dilation when in college.  He also has a history of elevated PSA and has undergone 2 prior negative prostate biopsies. PSA from 02/06/2013: 5.65  CT hematuria protocol from 12/09/2021 demonstrated a punctate nonobstructive calculus in the superior pole of the right kidney, no renal masses or filling defects, and no evidence of obstruction.  At his visit in 4/23, he had no further gross hematuria.  No dysuria or flank pain.  He continued on tamsulosin. IPSS = 8. He was evaluated with cystoscopy in 4/23 which showed regrowth of the prostate adenoma at the bladder neck, increased vascularity in the prostatic urethra, and bladder trabeculations with cellules. Urine  cytology was negative for malignancy.  PSA from 4/23: 7.6 with 25% free  At his visit in May 2023, he reported 1 episode of gross hematuria associated with strenuous activity.  His urinary symptoms remained very stable.  He was having nocturia 1-2 times.  No dysuria. He continues on tamsulosin 0.4 mg daily. IPSS = 8.  PVR = 261 ml. His dose of tamsulosin was increased to 0.4 mg BID.  At his visit in August 2023, he reported no further episodes of gross hematuria since his visit in May 2023.  He noted improvement in his urinary symptoms with the increased dose of tamsulosin.  He continued with nocturia 1-2 times.  No dysuria or flank pain. IPSS = 3.   PSA 8/23:  6.2  He returns today for follow-up.  He continues on tamsulosin 0.4 mg twice daily.  He reports that his urinary symptoms are stable and well controlled.  He continues with nocturia 2-3 times.  No dysuria or flank pain.  No further gross hematuria. IPSS = 7  QOL = 2 today.  Portions of the above documentation were copied from a prior visit for review purposes only.   Past Medical History:  Past Medical History:  Diagnosis Date   Acute coronary syndrome ([place]) 07/04/2021   Anxiety    BPH (benign prostatic hyperplasia)    Gout    Hypercholesterolemia    Hyperparathyroidism ([place])    Hypertension    Pulmonary emboli ([facility])    Vitamin D  deficiency    Vitamin D deficiency disease 10/07/2019    Past Surgical History:  Past Surgical History:  Procedure Laterality Date   CORONARY STENT INTERVENTION N/A 07/04/2021   Procedure: CORONARY STENT INTERVENTION;  Surgeon: Nelva Bush, MD;  Location: [place] [facility];  Service: Cardiovascular;  Laterality: N/A;   CORONARY STENT INTERVENTION N/A 07/06/2021   Procedure: CORONARY STENT INTERVENTION;  Surgeon: Troy Sine, MD;  Location: [place] [facility];  Service: Cardiovascular;  Laterality: N/A;   LEFT HEART CATH AND CORONARY ANGIOGRAPHY N/A 07/04/2021   Procedure: LEFT HEART  CATH AND CORONARY ANGIOGRAPHY;  Surgeon: Nelva Bush, MD;  Location: [place] [facility];  Service: Cardiovascular;  Laterality: N/A;   PERIPHERAL VASCULAR THROMBECTOMY     TRANSURETHRAL RESECTION OF PROSTATE     VASECTOMY      Allergies:  Allergies  Allergen Reactions   Sulfa Antibiotics Hives   Sulfamethoxazole Rash    Family History:  Family History  Problem Relation Age of Onset   Alzheimer's disease Mother        Vascular Dementia   Stroke Mother    Breast cancer Mother    Cancer Father        Multiple Myeloma   Hypertension Father    Deep vein thrombosis Brother    Heart disease Brother    Cancer Paternal Grandmother    Alcohol abuse Paternal Grandfather     Social History:  Social History   Tobacco Use   Smoking status: Former    Packs/day: 1.00    Years: 10.00    Total pack years: 10.00    Types: Cigarettes    Quit date: 04/21/1971    Years since quitting: 51.3   Smokeless tobacco: Never  Vaping Use   Vaping Use: Never used  Substance Use Topics   Alcohol use: No    Comment: former   Drug use: Not Currently    Types: Marijuana    Comment: last use: 9/30    ROS: Constitutional:  Negative for fever, chills, weight loss CV: Negative for chest pain, previous MI, hypertension Respiratory:  Negative for shortness of breath, wheezing, sleep apnea, frequent cough GI:  Negative for nausea, vomiting, bloody stool, GERD  Physical exam: BP (!) 165/78   Pulse 64   Ht _0  (1.753 m)   Wt 166 lb (75.3 kg)   BMI 24.51 kg/m  GENERAL APPEARANCE:  Well appearing, well developed, well nourished, NAD HEENT:  Atraumatic, normocephalic, oropharynx clear NECK:  Supple without lymphadenopathy or thyromegaly ABDOMEN:  Soft, non-tender, no masses EXTREMITIES:  Moves all extremities well, without clubbing, cyanosis, or edema NEUROLOGIC:  Alert and oriented x 3, normal gait, CN II-XII grossly intact MENTAL STATUS:  appropriate BACK:  Non-tender to palpation, No  CVAT SKIN:  Warm, dry, and intact  Results: U/A: 0-5 WBCs, 0-2 RBCs

## 2022-08-08 NOTE — Progress Notes (Signed)
Cardiology Office Note   Date:  08/11/2022   ID:  Adiv, Bonafede 1945-10-30, MRN [id number]  PCP:  Lindell Spar, MD  Cardiologist:   Dorris Carnes, MD   F?U of CAD      History of Present Illness: Billy Hunt is a 76 y.o. male with a history of CAD (NSTEMI in 06/2021), Cath showed multivessel dz.   Pt underwent PCI/DES to an occluded OM1 and then PCI/DES to LAD (staged intervention)    Pt also with a hx of HTN, hyperparthyoidism, BPH, DVT/PE, HL, gout, neuropathy, COPD    I saw the pt in clnic in March 2023   At that time he said he held his carvedilol and losartan and was feeling better   His BP was high in clinic so I recomm he bring cuff I saw the pt in clinci in May 2023     PT goes to gym about 3x per week   Works in yard  No CP  No SOB   Rare dizziess   Current Meds  Medication Sig   albuterol (VENTOLIN HFA) 108 (90 Base) MCG/ACT inhaler Inhale 2 puffs into the lungs every 6 (six) hours as needed for wheezing or shortness of breath.   aspirin 81 MG EC tablet Take 1 tablet (81 mg total) by mouth daily. Swallow whole.   cholecalciferol (VITAMIN D3) 25 MCG (1000 UT) tablet Take 1,000 Units by mouth daily.   cinacalcet (SENSIPAR) 30 MG tablet Take 1 tablet (30 mg total) by mouth 2 (two) times daily with a meal.   clopidogrel (PLAVIX) 75 MG tablet Take 4 tablets (300mg  loading dose) by mouth once approximately 12 hours after last dose of Brilinta, then decrease to 1 tablet daily thereafter   nitroGLYCERIN (NITROSTAT) 0.4 MG SL tablet Place 1 tablet (0.4 mg total) under the tongue every 5 (five) minutes x 3 doses as needed for chest pain.   tamsulosin (FLOMAX) 0.4 MG CAPS capsule Take 1 capsule (0.4 mg total) by mouth 2 (two) times daily.   venlafaxine XR (EFFEXOR-XR) 150 MG 24 hr capsule Take 1 capsule (150 mg total) by mouth daily.     Allergies:   Sulfa antibiotics and Sulfamethoxazole   Past Medical History:  Diagnosis Date   Acute coronary syndrome ([place])  07/04/2021   Anxiety    BPH (benign prostatic hyperplasia)    Gout    Hypercholesterolemia    Hyperparathyroidism ([place])    Hypertension    Pulmonary emboli ([place])    Vitamin D deficiency    Vitamin D deficiency disease 10/07/2019    Past Surgical History:  Procedure Laterality Date   CORONARY STENT INTERVENTION N/A 07/04/2021   Procedure: CORONARY STENT INTERVENTION;  Surgeon: Nelva Bush, MD;  Location: [place] [facility];  Service: Cardiovascular;  Laterality: N/A;   CORONARY STENT INTERVENTION N/A 07/06/2021   Procedure: CORONARY STENT INTERVENTION;  Surgeon: Troy Sine, MD;  Location: [place] [facility];  Service: Cardiovascular;  Laterality: N/A;   LEFT HEART CATH AND CORONARY ANGIOGRAPHY N/A 07/04/2021   Procedure: LEFT HEART CATH AND CORONARY ANGIOGRAPHY;  Surgeon: Nelva Bush, MD;  Location: [place] [facility];  Service: Cardiovascular;  Laterality: N/A;   PERIPHERAL VASCULAR THROMBECTOMY     TRANSURETHRAL RESECTION OF PROSTATE     VASECTOMY       Social History:  The patient  reports that he quit smoking about 51 years ago. His smoking use included cigarettes. He has a 10.00 pack-year  smoking history. He has never used smokeless tobacco. He reports that he does not currently use drugs after having used the following drugs: Marijuana. He reports that he does not drink alcohol.   Family History:  The patient's family history includes Alcohol abuse in his paternal grandfather; Alzheimer's disease in his mother; Breast cancer in his mother; Cancer in his father and paternal grandmother; Deep vein thrombosis in his brother; Heart disease in his brother; Hypertension in his father; Stroke in his mother.    ROS:  Please see the history of present illness. All other systems are reviewed and  Negative to the above problem except as noted.    PHYSICAL EXAM: VS:  BP (!) 130/94   Pulse (!) 54   Ht 5\' 9"  (1.753 m)   Wt 163 lb (73.9 kg)   SpO2 97%   BMI 24.07 kg/m    GEN: Well nourished, well developed, in no acute distress  HEENT: normal  Neck: no JVD Cardiac: RRR; no murmurs,  No LE edema  Respiratory:  clear to auscultation bilaterally, GI: soft, nontender, nondistended, + BS  No hepatomegaly  MS: no deformity Moving all extremities   Skin: warm and dry, no rash Neuro:  Strength and sensation are intact Psych: euthymic mood, full affect   EKG:  EKG is  ordered today.Sinus bradycardia   54 bpm  CATH 06/2021     Ost LM to Mid LM lesion is 20% stenosed.   Prox LAD lesion is 30% stenosed.   Mid LAD-2 lesion is 70% stenosed.   Prox Cx to Mid Cx lesion is 40% stenosed.   1st Diag-1 lesion is 30% stenosed.   1st Diag-2 lesion is 70% stenosed.   2nd Diag lesion is 80% stenosed.   1st Mrg lesion is 70% stenosed.   Mid LAD-1 lesion is 90% stenosed.   Mid LAD-3 lesion is 60% stenosed.   Non-stenotic 2nd Mrg lesion was previously treated.   A drug-eluting stent was successfully placed.   Post intervention, there is a 0% residual stenosis.   Post intervention, there is a 0% residual stenosis.   Post intervention, there is a 0% residual stenosis.   Successful staged PCI to the LAD in this patient who is 2 days status post initial presentation with ACS and total occlusion of the circumflex marginal vessel which was stented.   The LAD had diffuse disease of 90, 70 % and 60% between the 1st and second diagonal vessel which was successfully stented with a 2.5 x 38 mm Onyx frontier DES stent postdilated to 2.75 mm with the stenosis being reduced to 0%.   RECOMMENDATION: DAPT for minimum of 12 months in this patient status post ACS 2 days previously with successful stenting of the circumflex marginal vessel, and is successful staged PCI to diffusely diseased mid LAD today.  Medical therapy for concomitant RCA disease.  Aggressive lipid-lowering therapy with target LDL less than 70 and optimal blood pressure control.  Diagnostic Dominance:  Right Intervention         PCI 07/04/2021 Dr. Saunders Revel CORONARY STENT INTERVENTION  LEFT HEART CATH AND CORONARY ANGIOGRAPHY    Conclusion   Conclusions: Significant multivessel coronary artery disease, as detailed below.  Culprit lesion for the patient's NSTEMI is likely occluded ostial/proximal OM2 branch.  There is also severe mid LAD and D1 disease. Low normal left ventricular filling pressure (LVEDP ~5 mmHg). Successful PCI to OM2 using Onyx Frontier 2.5 x 26 mm drug-eluting stent with 0% residual stenosis and  TIMI-3 flow.   Recommendations: Continue cangrelor infusion for 2 hours following ticagrelor load. Aggressive secondary prevention. Wean IV nitroglycerin as tolerated. Consider staged PCI to mid LAD; timing (during this admission versus as an outpatient in the next few weeks) to be determined based on symptoms.     Diagnostic Dominance: Right Intervention        Echocardiogram 07/04/2021 1. Left ventricular ejection fraction, by estimation, is 60 to 65%. The  left ventricle has normal function. The left ventricle has no regional  wall motion abnormalities. The left ventricular internal cavity size was  mildly dilated. There is mild left  ventricular hypertrophy. Left ventricular diastolic parameters are  consistent with Grade I diastolic dysfunction (impaired relaxation).   2. Right ventricular systolic function is normal. The right ventricular  size is normal. There is mildly elevated pulmonary artery systolic  pressure.   3. Left atrial size was mildly dilated.   4. The mitral valve is normal in structure. Mild mitral valve  regurgitation.   5. The aortic valve is tricuspid. Aortic valve regurgitation is mild.  Mild aortic valve sclerosis is present, with no evidence of aortic valve  stenosis.   Comparison(s): The left ventricular function is unchanged.          Lipid Panel    Component Value Date/Time   CHOL 171 07/10/2022 0811   TRIG 76 07/10/2022  0811   HDL 58 07/10/2022 0811   CHOLHDL 2.9 07/10/2022 0811   CHOLHDL 4.4 07/05/2021 0320   VLDL 9 07/05/2021 0320   LDLCALC 99 07/10/2022 0811      Wt Readings from Last 3 Encounters:  08/11/22 163 lb (73.9 kg)  08/07/22 166 lb (75.3 kg)  07/13/22 166 lb (75.3 kg)      ASSESSMENT AND PLAN:   CAD    Pt s/p NSTEMI in October 2022   s/p PTCA/Stent to an occluded OM and then staged procedure for PTCA/DES to LAD        The pt is doing well  Denies CP  Very active      Currently on ASA/Plavix   Will review switching to monotherapy    2   HTN    BP readings a little high this morning   Pt will check at home   Write back if 130s/140s /90s     May need to intensify therapy       3   HL  Pt reports he has been approved for incliseran   Will review with research area  Follow up in 6 months    Current medicines are reviewed at length with the patient today.  The patient does not have concerns regarding medicines.  Signed, Dietrich Pates, MD  08/11/2022 10:35 AM    [facility] [address] [place], [place], [place]  [address] Phone: [phone]; Fax: [phone]

## 2022-08-11 ENCOUNTER — Encounter: Payer: Self-pay | Admitting: Internal Medicine

## 2022-08-11 ENCOUNTER — Ambulatory Visit: Payer: Medicare Other | Attending: Internal Medicine | Admitting: Internal Medicine

## 2022-08-11 VITALS — BP 130/94 | HR 54 | Ht 69.0 in | Wt 163.0 lb

## 2022-08-11 DIAGNOSIS — I25110 Atherosclerotic heart disease of native coronary artery with unstable angina pectoris: Secondary | ICD-10-CM

## 2022-08-11 DIAGNOSIS — I2510 Atherosclerotic heart disease of native coronary artery without angina pectoris: Secondary | ICD-10-CM | POA: Diagnosis not present

## 2022-08-11 NOTE — Patient Instructions (Signed)
Medication Instructions:  Your physician recommends that you continue on your current medications as directed. Please refer to the Current Medication list given to you today.   Labwork: None today  Testing/Procedures: None today  Follow-Up: June/July 2024  Any Other Special Instructions Will Be Listed Below (If Applicable).  If you need a refill on your cardiac medications before your next appointment, please call your pharmacy.

## 2022-08-15 ENCOUNTER — Other Ambulatory Visit: Payer: Self-pay | Admitting: "Endocrinology

## 2022-08-18 ENCOUNTER — Telehealth: Payer: Self-pay | Admitting: Internal Medicine

## 2022-08-18 NOTE — Telephone Encounter (Signed)
Please let patient know that I recomm he keep on Plavix    Can stop asprin

## 2022-08-18 NOTE — Telephone Encounter (Signed)
-----   Message from Yvonne Kendall, MD sent at 08/12/2022  4:00 PM EST ----- Hi Billy Hunt,  Thanks for your message.  I think would be fine to stop aspirin and continue with clopidogrel monotherapy at this point.  Let me know if any other questions come up.  Have a great weekend.  Thayer Ohm ----- Message ----- From: Pricilla Riffle, MD Sent: 08/11/2022  10:58 AM EST To: Yvonne Kendall, MD  Thayer Ohm, This pt had an intervention on occluded OM that you did   Then had intervention on LAD   Doing well I was thinking of just mvoing to Plavix   Stop asprion   Thoughts?

## 2022-08-21 ENCOUNTER — Other Ambulatory Visit: Payer: Self-pay | Admitting: Pharmacist

## 2022-08-21 DIAGNOSIS — E782 Mixed hyperlipidemia: Secondary | ICD-10-CM

## 2022-08-21 DIAGNOSIS — I214 Non-ST elevation (NSTEMI) myocardial infarction: Secondary | ICD-10-CM

## 2022-08-21 MED ORDER — ROSUVASTATIN CALCIUM 5 MG PO TABS: 5.0000 mg | ORAL_TABLET | Freq: Every day | ORAL | 3 refills | Status: DC

## 2022-08-21 NOTE — Addendum Note (Signed)
Addended by: Bertram Millard on: 08/21/2022 09:54 AM   Modules accepted: Orders

## 2022-08-21 NOTE — Telephone Encounter (Signed)
Dr Tenny Craw' message sent to his My Chart.

## 2022-08-21 NOTE — Telephone Encounter (Signed)
Pricilla Riffle, MD  to Me     08/18/22 10:26 AM Low dose Crestor and recheck in 1 month would be great August 11, 2022 Me  to Pricilla Riffle, MD     08/11/22  1:09 PM His baseline LDL-C is around 160. The lab where his LDL-C was 35 is from when he was on both Leqvio and atorvastatin 80mg . Atorvastatin was stopped due to increase in LFT. His current LDL-c shows a little under the expected 50% reduction with Leqvio. I can talk to him about adding low dose rosuvastatin and rechecking LFT in 1 month if you would like? , MD  to Me     08/11/22 10:56 AM Billy Hunt,   This pt got an infusion in Aug .  What is the timeline for action    I expected his LDL to be lower  Sep

## 2022-08-21 NOTE — Telephone Encounter (Signed)
Spoke to patient. He is willing to start rosuvastatin 5mg  daily. He will go for labs in [place] in 1 month. Orders placed. He previously was also on simvastatin without any LFT issues.

## 2022-08-21 NOTE — Addendum Note (Signed)
Addended by: Malena Peer D on: 08/21/2022 03:08 PM   Modules accepted: Orders

## 2022-08-23 DIAGNOSIS — H6123 Impacted cerumen, bilateral: Secondary | ICD-10-CM | POA: Diagnosis not present

## 2022-08-23 DIAGNOSIS — H903 Sensorineural hearing loss, bilateral: Secondary | ICD-10-CM | POA: Diagnosis not present

## 2022-08-23 DIAGNOSIS — H838X3 Other specified diseases of inner ear, bilateral: Secondary | ICD-10-CM | POA: Diagnosis not present

## 2022-09-12 ENCOUNTER — Other Ambulatory Visit: Payer: Self-pay | Admitting: "Endocrinology

## 2022-09-20 ENCOUNTER — Telehealth: Payer: Self-pay

## 2022-09-20 NOTE — Telephone Encounter (Signed)
Pt is scheduled to come back on 11/14/22. He cannot have a repeat Bone Density until after 11-27-22. Ok for him to stay on the sched for 11/14/22 or does he need to move?  FYI : [facility] radiology has his order to be scheduled.

## 2022-09-21 NOTE — Telephone Encounter (Signed)
Pts appt was changed to 12-13-22. Gave pt # to [facility] radiology to get Bone density scheduled on preferred date.

## 2022-09-25 ENCOUNTER — Ambulatory Visit (INDEPENDENT_AMBULATORY_CARE_PROVIDER_SITE_OTHER): Payer: Medicare Other

## 2022-09-25 VITALS — BP 156/74 | HR 74 | Ht 69.0 in | Wt 161.1 lb

## 2022-09-25 DIAGNOSIS — Z0000 Encounter for general adult medical examination without abnormal findings: Secondary | ICD-10-CM | POA: Diagnosis not present

## 2022-09-25 NOTE — Progress Notes (Unsigned)
Subjective:   Billy Hunt is a 77 y.o. male who presents for Medicare Annual/Subsequent preventive examination.  Review of Systems           Objective:    There were no vitals filed for this visit. There is no height or weight on file to calculate BMI.     09/20/2021    8:41 AM 07/05/2021    5:00 PM 08/16/2020   11:15 AM 08/04/2019    3:00 PM 06/12/2015    8:45 AM 04/21/2015    7:16 AM  Advanced Directives  Does Patient Have a Medical Advance Directive? No;Yes Yes Yes Yes Yes Yes  Type of Advance Directive Living will [place];Living will Living will;Healthcare Power of Attorney Out of facility DNR (pink MOST or yellow form)  [place];Living will  Does patient want to make changes to medical advance directive?  No - Patient declined No - Patient declined No - Patient declined    Copy of [place] in Chart?  No - copy requested No - copy requested  No - copy requested   Would patient like information on creating a medical advance directive? No - Patient declined         Current Medications (verified) Outpatient Encounter Medications as of 09/25/2022  Medication Sig   albuterol (VENTOLIN HFA) 108 (90 Base) MCG/ACT inhaler Inhale 2 puffs into the lungs every 6 (six) hours as needed for wheezing or shortness of breath.   cholecalciferol (VITAMIN D3) 25 MCG (1000 UT) tablet Take 1,000 Units by mouth daily.   cinacalcet (SENSIPAR) 30 MG tablet TAKE 1 TABLET BY MOUTH TWICE DAILY WITH A MEAL   clopidogrel (PLAVIX) 75 MG tablet Take 4 tablets (300mg  loading dose) by mouth once approximately 12 hours after last dose of Brilinta, then decrease to 1 tablet daily thereafter   nitroGLYCERIN (NITROSTAT) 0.4 MG SL tablet Place 1 tablet (0.4 mg total) under the tongue every 5 (five) minutes x 3 doses as needed for chest pain.   rosuvastatin (CRESTOR) 5 MG tablet Take 1 tablet (5 mg total) by mouth daily.   tamsulosin (FLOMAX) 0.4 MG  CAPS capsule Take 1 capsule (0.4 mg total) by mouth 2 (two) times daily.   venlafaxine XR (EFFEXOR-XR) 150 MG 24 hr capsule Take 1 capsule (150 mg total) by mouth daily.   No facility-administered encounter medications on file as of 09/25/2022.    Allergies (verified) Sulfa antibiotics and Sulfamethoxazole   History: Past Medical History:  Diagnosis Date   Acute coronary syndrome ([place]) 07/04/2021   Allergy 1976   Anxiety    BPH (benign prostatic hyperplasia)    Gout    Hypercholesterolemia    Hyperparathyroidism ([place])    Hypertension    Myocardial infarction ([place]) 06/2021   Neuromuscular disorder ([place]) 2021   Osteoporosis 2021   Pulmonary emboli ([place])    Vitamin D deficiency    Vitamin D deficiency disease 10/07/2019   Past Surgical History:  Procedure Laterality Date   CORONARY STENT INTERVENTION N/A 07/04/2021   Procedure: CORONARY STENT INTERVENTION;  Surgeon: Nelva Bush, MD;  Location: [facility];  Service: Cardiovascular;  Laterality: N/A;   CORONARY STENT INTERVENTION N/A 07/06/2021   Procedure: CORONARY STENT INTERVENTION;  Surgeon: Troy Sine, MD;  Location: [facility];  Service: Cardiovascular;  Laterality: N/A;   LEFT HEART CATH AND CORONARY ANGIOGRAPHY N/A 07/04/2021   Procedure: LEFT HEART CATH AND CORONARY ANGIOGRAPHY;  Surgeon:  End, Cristal Deer, MD;  Location: [facility];  Service: Cardiovascular;  Laterality: N/A;   PERIPHERAL VASCULAR THROMBECTOMY     TRANSURETHRAL RESECTION OF PROSTATE     VASECTOMY     Family History  Problem Relation Age of Onset   Alzheimer's disease Mother        Vascular Dementia   Stroke Mother    Breast cancer Mother    Cancer Father        Multiple Myeloma   Hypertension Father    Deep vein thrombosis Brother    Heart disease Brother    Cancer Paternal Grandmother    Alcohol abuse Paternal Grandfather    Social History   Socioeconomic History   Marital status: Divorced    Spouse name:  Not on file   Number of children: 2   Years of education: 12   Highest education level: Master's degree (e.g., MA, MS, MEng, MEd, MSW, MBA)  Occupational History   Not on file  Tobacco Use   Smoking status: Former    Packs/day: 1.00    Years: 10.00    Total pack years: 10.00    Types: Cigarettes    Quit date: 04/21/1971    Years since quitting: 51.4   Smokeless tobacco: Never  Vaping Use   Vaping Use: Never used  Substance and Sexual Activity   Alcohol use: No    Comment: former   Drug use: Yes    Types: Marijuana    Comment: last use: 9/30   Sexual activity: Yes    Birth control/protection: Other-see comments  Other Topics Concern   Not on file  Social History Narrative   Not on file   Social Determinants of Health   Financial Resource Strain: Low Risk  (09/24/2022)   Overall Financial Resource Strain (CARDIA)    Difficulty of Paying Living Expenses: Not hard at all  Food Insecurity: No Food Insecurity (09/24/2022)   Hunger Vital Sign    Worried About Running Out of Food in the Last Year: Never true    Ran Out of Food in the Last Year: Never true  Transportation Needs: No Transportation Needs (09/24/2022)   PRAPARE - Administrator, Civil Service (Medical): No    Lack of Transportation (Non-Medical): No  Physical Activity: Insufficiently Active (09/24/2022)   Exercise Vital Sign    Days of Exercise per Week: 3 days    Minutes of Exercise per Session: 40 min  Stress: No Stress Concern Present (09/24/2022)   [employer] of Occupational Health - Occupational Stress Questionnaire    Feeling of Stress : Not at all  Social Connections: Moderately Isolated (09/24/2022)   Social Connection and Isolation Panel [NHANES]    Frequency of Communication with Friends and Family: Three times a week    Frequency of Social Gatherings with Friends and Family: Once a week    Attends Religious Services: Never    Database administrator or Organizations: No    Attends Occupational hygienist Meetings: Never    Marital Status: Living with partner    Tobacco Counseling Counseling given: Not Answered   Clinical Intake:              How often do you need to have someone help you when you read instructions, pamphlets, or other written materials from your doctor or pharmacy?: 1 - Never  Diabetic?NO         Activities of Daily Living    09/24/2022  9:18 PM  In your present state of health, do you have any difficulty performing the following activities:  Hearing? 0  Vision? 0  Difficulty concentrating or making decisions? 0  Walking or climbing stairs? 0  Dressing or bathing? 0  Doing errands, shopping? 0  Preparing Food and eating ? N  Using the Toilet? N  In the past six months, have you accidently leaked urine? N  Do you have problems with loss of bowel control? N  Managing your Medications? N  Managing your Finances? N  Housekeeping or managing your Housekeeping? N    Patient Care Team: Lindell Spar, MD as PCP - General (Internal Medicine) Fay Records, MD as PCP - Cardiology (Cardiology)  Indicate any recent Medical Services you may have received from other than [facility] providers in the past year (date may be approximate).     Assessment:   This is a routine wellness examination for Dom.  Hearing/Vision screen No results found.  Dietary issues and exercise activities discussed:     Goals Addressed   None   Depression Screen    05/16/2022    9:01 AM 04/20/2022    8:58 AM 03/02/2022    3:47 PM 01/20/2022    3:27 PM 12/01/2021    9:12 AM 11/09/2021    8:44 AM 09/20/2021    8:43 AM  PHQ 2/9 Scores  PHQ - 2 Score 0 0 2 0 0 0 0  PHQ- 9 Score 0 0 3        Fall Risk    09/24/2022    9:18 PM 05/16/2022    9:01 AM 04/20/2022    8:58 AM 12/01/2021    9:11 AM 11/09/2021    8:44 AM  Fall Risk   Falls in the past year? 1 1 0 0 0  Number falls in past yr: 0 0 0 0 0  Injury with Fall? 1 0 0 0 0  Risk for fall due to :  History  of fall(s) No Fall Risks No Fall Risks No Fall Risks  Follow up  Falls evaluation completed;Education provided;Falls prevention discussed Falls evaluation completed Falls evaluation completed Falls evaluation completed    FALL RISK PREVENTION PERTAINING TO THE HOME:  Any stairs in or around the home? Yes  If so, are there any without handrails? No  Home free of loose throw rugs in walkways, pet beds, electrical cords, etc? Yes  Adequate lighting in your home to reduce risk of falls? Yes   ASSISTIVE DEVICES UTILIZED TO PREVENT FALLS:  Life alert? No  Use of a cane, walker or w/c? No  Grab bars in the bathroom? Yes  Shower chair or bench in shower? No  Elevated toilet seat or a handicapped toilet? No           09/20/2021    8:45 AM 08/16/2020   11:19 AM 08/04/2019    2:59 PM  6CIT Screen  What Year? 0 points 0 points 0 points  What month? 0 points 0 points 0 points  What time? 0 points 0 points 0 points  Count back from 20 0 points 0 points 0 points  Months in reverse 0 points 0 points 0 points  Repeat phrase 0 points 4 points 2 points  Total Score 0 points 4 points 2 points    Immunizations Immunization History  Administered Date(s) Administered   Fluad Quad(high Dose 65+) 08/04/2019, 08/17/2020, 06/01/2021   Influenza-Unspecified 06/11/2022   Moderna Sars-Covid-2 Vaccination 10/23/2019, 11/21/2019, 07/05/2020  Pneumococcal Conjugate-13 08/16/2015   Pneumococcal Polysaccharide-23 07/16/2017   Tdap 05/15/2011, 11/17/2021   Unspecified SARS-COV-2 Vaccination 12/18/2020   Zoster Recombinat (Shingrix) 04/19/2018, 11/17/2021    TDAP status: Up to date  Flu Vaccine status: Up to date  Pneumococcal vaccine status: Up to date  Covid-19 vaccine status: Completed vaccines  Qualifies for Shingles Vaccine? Yes   Zostavax completed Yes   Shingrix Completed?: Yes  Screening Tests Health Maintenance  Topic Date Due   COVID-19 Vaccine (5 - 2023-24 season) 05/12/2022    COLONOSCOPY (Pts 45-54yrs Insurance coverage will need to be confirmed)  04/12/2023   Medicare Annual Wellness (AWV)  09/26/2023   DTaP/Tdap/Td (3 - Td or Tdap) 11/18/2031   Pneumonia Vaccine 36+ Years old  Completed   INFLUENZA VACCINE  Completed   Hepatitis C Screening  Completed   Zoster Vaccines- Shingrix  Completed   HPV VACCINES  Aged Out    Health Maintenance  Health Maintenance Due  Topic Date Due   COVID-19 Vaccine (5 - 2023-24 season) 05/12/2022    Colorectal cancer screening: Type of screening: Colonoscopy. Completed 04/11/2013. Repeat every 10 years  Lung Cancer Screening: (Low Dose CT Chest recommended if Age 91-80 years, 30 pack-year currently smoking OR have quit w/in 15years.) does not qualify.   Lung Cancer Screening Referral: no  Additional Screening:  Hepatitis C Screening: does qualify; Completed 05/10/22  Vision Screening: Recommended annual ophthalmology exams for early detection of glaucoma and other disorders of the eye. Is the patient up to date with their annual eye exam?  No  Who is the provider or what is the name of the office in which the patient attends annual eye exams? N/a If pt is not established with a provider, would they like to be referred to a provider to establish care? No .   Dental Screening: Recommended annual dental exams for proper oral hygiene  Community Resource Referral / Chronic Care Management: CRR required this visit?  No   CCM required this visit?  No      Plan:     I have personally reviewed and noted the following in the patient's chart:   Medical and social history Use of alcohol, tobacco or illicit drugs  Current medications and supplements including opioid prescriptions. Patient is not currently taking opioid prescriptions. Functional ability and status Nutritional status Physical activity Advanced directives List of other physicians Hospitalizations, surgeries, and ER visits in previous 12  months Vitals Screenings to include cognitive, depression, and falls Referrals and appointments  In addition, I have reviewed and discussed with patient certain preventive protocols, quality metrics, and best practice recommendations. A written personalized care plan for preventive services as well as general preventive health recommendations were provided to patient.     Jasper Riling, CMA   09/25/2022

## 2022-09-25 NOTE — Patient Instructions (Signed)
  Mr. Burgher , Thank you for taking time to come for your Medicare Wellness Visit. I appreciate your ongoing commitment to your health goals. Please review the following plan we discussed and let me know if I can assist you in the future.   These are the goals we discussed:  Goals       Coping Skills Enhanced. Manage anxiety issues. Use Coping skils learned to manage medical condtions (pt-stated)      Time Frame:  Short Term Goal Priority:  Medium Progress:  On Track  Start Date: 01/20/22  End Date:  06/07/22    Follow Up Date:  No follow up call needed at present   Coping Skills Enhanced.  Manage anxiety issues. Use coping skills learned to manage medical conditions  Patient Coping Skills: Drives to needed local appointments. Has support from Shannan Harper Takes medications as prescribed Attends scheduled medical appointments  Patient Deficits: Some anxiety issues Coping challenges in managing medical condtions  Patient Goals: In next 30 days, patient will: Attend scheduled medical appointments Take medications as prescribed Continue to communicate regularly with Shannan Harper related to his daily needs Communicate with RNCM as needed to discuss nursing needs of client -  Follow Up Plan: No follow up call needed at present.       Patient Stated (pt-stated)      None        This is a list of the screening recommended for you and due dates:  Health Maintenance  Topic Date Due   COVID-19 Vaccine (5 - 2023-24 season) 05/12/2022   Colon Cancer Screening  04/12/2023   Medicare Annual Wellness Visit  09/26/2023   DTaP/Tdap/Td vaccine (3 - Td or Tdap) 11/18/2031   Pneumonia Vaccine  Completed   Flu Shot  Completed   Hepatitis C Screening: USPSTF Recommendation to screen - Ages 18-79 yo.  Completed   Zoster (Shingles) Vaccine  Completed   HPV Vaccine  Aged Out

## 2022-09-29 ENCOUNTER — Encounter: Payer: Self-pay | Admitting: Pharmacist

## 2022-10-03 ENCOUNTER — Other Ambulatory Visit: Payer: Self-pay | Admitting: *Deleted

## 2022-10-03 MED ORDER — CLOPIDOGREL BISULFATE 75 MG PO TABS: ORAL_TABLET | ORAL | 3 refills | Status: DC

## 2022-10-11 DIAGNOSIS — I214 Non-ST elevation (NSTEMI) myocardial infarction: Secondary | ICD-10-CM | POA: Diagnosis not present

## 2022-10-11 DIAGNOSIS — E782 Mixed hyperlipidemia: Secondary | ICD-10-CM | POA: Diagnosis not present

## 2022-10-12 ENCOUNTER — Other Ambulatory Visit: Payer: Self-pay | Admitting: Pharmacist

## 2022-10-12 LAB — LIPID PANEL
Chol/HDL Ratio: 2.2 ratio (ref 0.0–5.0)
Cholesterol, Total: 123 mg/dL (ref 100–199)
HDL: 57 mg/dL (ref >39)
LDL Chol Calc (NIH): 54 mg/dL (ref 0–99)
Triglycerides: 56 mg/dL (ref 0–149)
VLDL Cholesterol Cal: 12 mg/dL (ref 5–40)

## 2022-10-12 LAB — HEPATIC FUNCTION PANEL
ALT: 11 IU/L (ref 0–44)
AST: 18 IU/L (ref 0–40)
Albumin: 4.4 g/dL (ref 3.8–4.8)
Alkaline Phosphatase: 102 IU/L (ref 44–121)
Bilirubin Total: 0.6 mg/dL (ref 0.0–1.2)
Bilirubin, Direct: 0.19 mg/dL (ref 0.00–0.40)
Total Protein: 7.1 g/dL (ref 6.0–8.5)

## 2022-10-16 ENCOUNTER — Ambulatory Visit (INDEPENDENT_AMBULATORY_CARE_PROVIDER_SITE_OTHER): Payer: Medicare Other

## 2022-10-16 ENCOUNTER — Other Ambulatory Visit: Payer: Self-pay | Admitting: Pharmacy Technician

## 2022-10-16 VITALS — BP 157/77 | HR 57 | Temp 97.5°F | Resp 18 | Ht 69.0 in | Wt 162.6 lb

## 2022-10-16 DIAGNOSIS — I25110 Atherosclerotic heart disease of native coronary artery with unstable angina pectoris: Secondary | ICD-10-CM

## 2022-10-16 DIAGNOSIS — E782 Mixed hyperlipidemia: Secondary | ICD-10-CM

## 2022-10-16 MED ORDER — INCLISIRAN SODIUM 284 MG/1.5ML ~~LOC~~ SOSY
284.0000 mg | PREFILLED_SYRINGE | Freq: Once | SUBCUTANEOUS | Status: AC
  Administered 2022-10-16: 284 mg via SUBCUTANEOUS
  Filled 2022-10-16: qty 1.5

## 2022-10-16 NOTE — Progress Notes (Signed)
Diagnosis: Hyperlipidemia  Provider:  Marshell Garfinkel MD  Procedure: Injection  Leqvio (inclisiran), Dose: 284 mg, Site: subcutaneous, Number of injections: 1  Post Care: Patient declined observation  Discharge: Condition: Good, Destination: Home . AVS provided to patient.   Performed by:  Adelina Mings, LPN

## 2022-10-27 DIAGNOSIS — E210 Primary hyperparathyroidism: Secondary | ICD-10-CM | POA: Diagnosis not present

## 2022-10-28 LAB — PTH, INTACT AND CALCIUM
Calcium: 9.4 mg/dL (ref 8.6–10.2)
PTH: 52 pg/mL (ref 15–65)

## 2022-11-14 ENCOUNTER — Ambulatory Visit (INDEPENDENT_AMBULATORY_CARE_PROVIDER_SITE_OTHER): Payer: Medicare Other | Admitting: Internal Medicine

## 2022-11-14 ENCOUNTER — Encounter: Payer: Self-pay | Admitting: Internal Medicine

## 2022-11-14 ENCOUNTER — Ambulatory Visit: Payer: Medicare Other | Admitting: "Endocrinology

## 2022-11-14 VITALS — BP 130/72 | HR 71 | Ht 69.0 in | Wt 159.6 lb

## 2022-11-14 DIAGNOSIS — F411 Generalized anxiety disorder: Secondary | ICD-10-CM

## 2022-11-14 DIAGNOSIS — I10 Essential (primary) hypertension: Secondary | ICD-10-CM

## 2022-11-14 DIAGNOSIS — E210 Primary hyperparathyroidism: Secondary | ICD-10-CM

## 2022-11-14 DIAGNOSIS — I25110 Atherosclerotic heart disease of native coronary artery with unstable angina pectoris: Secondary | ICD-10-CM

## 2022-11-14 DIAGNOSIS — M7521 Bicipital tendinitis, right shoulder: Secondary | ICD-10-CM

## 2022-11-14 MED ORDER — VENLAFAXINE HCL ER 150 MG PO CP24: 150.0000 mg | ORAL_CAPSULE | Freq: Every day | ORAL | 3 refills | Status: DC

## 2022-11-14 NOTE — Assessment & Plan Note (Addendum)
BP Readings from Last 1 Encounters:  11/14/22 130/72   Well-controlled Discontinued Losartan and Coreg in the last visit due to low BP at home Advised DASH diet and moderate exercise/walking, at least 150 mins/week

## 2022-11-14 NOTE — Patient Instructions (Signed)
Please continue to take medications as prescribed.  Please continue to follow low salt diet and perform moderate exercise/walking at least 150 mins/week.  You are being referred to physical therapy for arm/shoulder pain.

## 2022-11-14 NOTE — Progress Notes (Unsigned)
Established Patient Office Visit  Subjective:  Patient ID: Billy Hunt, male    DOB: [birth year]  Age: 77 y.o. MRN: [id number]  CC:  Chief Complaint  Patient presents with   Hypertension    Follow up for hypertension. Patient is having trouble raising his right arm.    HPI Billy Hunt is a 77 y.o. male with past medical history of CAD s/p stent placement, hyperparathyroidism, osteopenia, BPH, PE, HLD, gout and anxiety who presents for f/u of his chronic medical conditions.  CAD and HTN:  BP is well-controlled. Patient denies headache, dizziness, chest pain, dyspnea or palpitations. He is on Aspirin and Plavix currently. He is in research study with Leqvio for HLD and is tolerating it well. His liver enzymes were elevated with statin, which have trended down since stopping it.  BPH: Currently well-controlled with Flomax.  His urinary hesitancy has improved with Flomax twice daily.  His PSA was slightly lower compared to prior, followed by urology.  Denies any dysuria or hematuria currently.  Gout: His acute gout flareup has resolved now.  Hyperparathyroidism: He takes Cinacalcet for it.  His calcium level was WNL.  He has had right shoulder pain since 10/16/22.  Denies any recent injury.  His pain is constant, sharp, worse with flexion and heavy lifting.  He has not tried any medicine for it yet.  Denies any numbness or tingling of the RUE.  Denies any swelling of the right shoulder area.  Past Medical History:  Diagnosis Date   Acute coronary syndrome ([place]) 07/04/2021   Allergy 1976   Anxiety    BPH (benign prostatic hyperplasia)    Gout    Hypercholesterolemia    Hyperparathyroidism ([place])    Hypertension    Myocardial infarction ([place]) 06/2021   Neuromuscular disorder ([place]) 2021   Osteoporosis 2021   Pulmonary emboli ([place])    Vitamin D deficiency    Vitamin D deficiency disease 10/07/2019    Past Surgical History:  Procedure Laterality Date   CORONARY STENT  INTERVENTION N/A 07/04/2021   Procedure: CORONARY STENT INTERVENTION;  Surgeon: Nelva Bush, MD;  Location: [facility];  Service: Cardiovascular;  Laterality: N/A;   CORONARY STENT INTERVENTION N/A 07/06/2021   Procedure: CORONARY STENT INTERVENTION;  Surgeon: Troy Sine, MD;  Location: [facility];  Service: Cardiovascular;  Laterality: N/A;   LEFT HEART CATH AND CORONARY ANGIOGRAPHY N/A 07/04/2021   Procedure: LEFT HEART CATH AND CORONARY ANGIOGRAPHY;  Surgeon: Nelva Bush, MD;  Location: [facility];  Service: Cardiovascular;  Laterality: N/A;   PERIPHERAL VASCULAR THROMBECTOMY     TRANSURETHRAL RESECTION OF PROSTATE     VASECTOMY      Family History  Problem Relation Age of Onset   Alzheimer's disease Mother        Vascular Dementia   Stroke Mother    Breast cancer Mother    Cancer Father        Multiple Myeloma   Hypertension Father    Deep vein thrombosis Brother    Heart disease Brother    Cancer Paternal Grandmother    Alcohol abuse Paternal Grandfather     Social History   Socioeconomic History   Marital status: Divorced    Spouse name: Not on file   Number of children: 2   Years of education: 12   Highest education level: Master's degree (e.g., MA, MS, MEng, MEd, MSW, MBA)  Occupational History   Not on file  Tobacco  Use   Smoking status: Former    Packs/day: 1.00    Years: 10.00    Total pack years: 10.00    Types: Cigarettes    Quit date: 04/21/1971    Years since quitting: 51.6   Smokeless tobacco: Never  Vaping Use   Vaping Use: Never used  Substance and Sexual Activity   Alcohol use: No    Comment: former   Drug use: Yes    Types: Marijuana    Comment: last use: 9/30   Sexual activity: Yes    Birth control/protection: Other-see comments  Other Topics Concern   Not on file  Social History Narrative   Not on file   Social Determinants of Health   Financial Resource Strain: Low Risk  (09/24/2022)   Overall  Financial Resource Strain (CARDIA)    Difficulty of Paying Living Expenses: Not hard at all  Food Insecurity: No Food Insecurity (09/24/2022)   Hunger Vital Sign    Worried About Running Out of Food in the Last Year: Never true    Ran Out of Food in the Last Year: Never true  Transportation Needs: No Transportation Needs (09/24/2022)   PRAPARE - Hydrologist (Medical): No    Lack of Transportation (Non-Medical): No  Physical Activity: Insufficiently Active (09/24/2022)   Exercise Vital Sign    Days of Exercise per Week: 3 days    Minutes of Exercise per Session: 40 min  Stress: No Stress Concern Present (09/24/2022)   [place]    Feeling of Stress : Not at all  Social Connections: Moderately Isolated (09/24/2022)   Social Connection and Isolation Panel [NHANES]    Frequency of Communication with Friends and Family: Three times a week    Frequency of Social Gatherings with Friends and Family: Once a week    Attends Religious Services: Never    Marine scientist or Organizations: No    Attends Archivist Meetings: Never    Marital Status: Living with partner  Intimate Partner Violence: Unknown (09/20/2021)   Humiliation, Afraid, Rape, and Kick questionnaire    Fear of Current or Ex-Partner: No    Emotionally Abused: No    Physically Abused: Not on file    Sexually Abused: No    Outpatient Medications Prior to Visit  Medication Sig Dispense Refill   albuterol (VENTOLIN HFA) 108 (90 Base) MCG/ACT inhaler Inhale 2 puffs into the lungs every 6 (six) hours as needed for wheezing or shortness of breath. 8 g 0   cholecalciferol (VITAMIN D3) 25 MCG (1000 UT) tablet Take 1,000 Units by mouth daily.     cinacalcet (SENSIPAR) 30 MG tablet TAKE 1 TABLET BY MOUTH TWICE DAILY WITH A MEAL 180 tablet 0   clopidogrel (PLAVIX) 75 MG tablet Take one (1) tablet by mouth (75 mg) daily. 90 tablet 3    inclisiran (LEQVIO) 284 MG/1.5ML SOSY injection Inject 1.5 mLs (284 mg total) into the skin. Every 6 months 1.5 mL 0   nitroGLYCERIN (NITROSTAT) 0.4 MG SL tablet Place 1 tablet (0.4 mg total) under the tongue every 5 (five) minutes x 3 doses as needed for chest pain. 25 tablet 2   rosuvastatin (CRESTOR) 5 MG tablet Take 1 tablet (5 mg total) by mouth daily. 90 tablet 3   tamsulosin (FLOMAX) 0.4 MG CAPS capsule Take 1 capsule (0.4 mg total) by mouth 2 (two) times daily. 180 capsule 3   venlafaxine  XR (EFFEXOR-XR) 150 MG 24 hr capsule Take 1 capsule (150 mg total) by mouth daily. 90 capsule 3   No facility-administered medications prior to visit.    Allergies  Allergen Reactions   Sulfa Antibiotics Hives   Sulfamethoxazole Rash    ROS Review of Systems  Constitutional:  Negative for chills and fever.  HENT:  Negative for congestion and sore throat.   Eyes:  Negative for pain and discharge.  Respiratory:  Negative for cough and shortness of breath.   Cardiovascular:  Negative for chest pain and palpitations.  Gastrointestinal:  Negative for diarrhea, nausea and vomiting.  Endocrine: Negative for polydipsia and polyuria.  Genitourinary:  Negative for dysuria and hematuria.  Musculoskeletal:  Negative for neck pain and neck stiffness.       Right shoulder pain  Skin:  Negative for rash.  Neurological:  Negative for dizziness, weakness and headaches.  Psychiatric/Behavioral:  Negative for agitation and behavioral problems.       Objective:    Physical Exam Vitals reviewed.  Constitutional:      General: He is not in acute distress.    Appearance: He is not diaphoretic.  HENT:     Head: Normocephalic and atraumatic.     Nose: Nose normal.     Mouth/Throat:     Mouth: Mucous membranes are moist.  Eyes:     General: No scleral icterus.    Extraocular Movements: Extraocular movements intact.  Cardiovascular:     Rate and Rhythm: Normal rate and regular rhythm.     Pulses:  Normal pulses.     Heart sounds: Normal heart sounds. No murmur heard. Pulmonary:     Breath sounds: Normal breath sounds. No wheezing or rales.  Musculoskeletal:     Right shoulder: Tenderness present. Decreased range of motion.     Cervical back: Neck supple. No tenderness.     Right lower leg: No edema.     Left lower leg: No edema.  Skin:    General: Skin is warm.     Findings: Bruising (B/l UE) present. No rash.  Neurological:     General: No focal deficit present.     Mental Status: He is alert and oriented to person, place, and time.  Psychiatric:        Mood and Affect: Mood normal.        Behavior: Behavior normal.     BP 130/72 (BP Location: Right Arm, Cuff Size: Normal)   Pulse 71   Ht '5\' 9"'$  (1.753 m)   Wt 159 lb 9.6 oz (72.4 kg)   SpO2 98%   BMI 23.57 kg/m  Wt Readings from Last 3 Encounters:  11/14/22 159 lb 9.6 oz (72.4 kg)  10/16/22 162 lb 9.6 oz (73.8 kg)  09/25/22 161 lb 1.3 oz (73.1 kg)    Lab Results  Component Value Date   TSH 1.910 05/10/2022   Lab Results  Component Value Date   WBC 5.1 05/10/2022   HGB 15.0 05/10/2022   HCT 45.5 05/10/2022   MCV 92 05/10/2022   PLT 214 05/10/2022   Lab Results  Component Value Date   NA 144 05/10/2022   K 4.8 05/10/2022   CO2 24 05/10/2022   GLUCOSE 95 05/10/2022   BUN 12 05/10/2022   CREATININE 1.06 05/10/2022   BILITOT 0.6 10/11/2022   ALKPHOS 102 10/11/2022   AST 18 10/11/2022   ALT 11 10/11/2022   PROT 7.1 10/11/2022   ALBUMIN 4.4 10/11/2022   CALCIUM  9.4 10/27/2022   ANIONGAP 9 07/05/2021   EGFR 74 05/10/2022   Lab Results  Component Value Date   CHOL 123 10/11/2022   Lab Results  Component Value Date   HDL 57 10/11/2022   Lab Results  Component Value Date   LDLCALC 54 10/11/2022   Lab Results  Component Value Date   TRIG 56 10/11/2022   Lab Results  Component Value Date   CHOLHDL 2.2 10/11/2022   Lab Results  Component Value Date   HGBA1C 5.6 05/10/2022       Assessment & Plan:   Problem List Items Addressed This Visit       Cardiovascular and Mediastinum   HTN (hypertension) - Primary    BP Readings from Last 1 Encounters:  11/14/22 130/72  Well-controlled Discontinued Losartan and Coreg in the last visit due to low BP at home Advised DASH diet and moderate exercise/walking, at least 150 mins/week      Coronary artery disease involving native coronary artery of native heart with unstable angina pectoris ([place])    S/p PCI X 2 On Aspirin, Plavix and Leqvio No chest pain or dyspnea currently Followed by Cardiology        Endocrine   Hyperparathyroidism, primary ([place])    Stable On Cinacalcet Followed by Dr Dorris Fetch        Musculoskeletal and Integument   Biceps tendonitis on right    Right shoulder pain likely due to biceps tendinitis Tylenol as needed for pain, avoid oral NSAIDs as he takes Plavix and has h/o CAD Referred to OT If persistent pain, will refer to Orthopedic surgeon      Relevant Orders   Ambulatory referral to Occupational Therapy     Other   GAD (generalized anxiety disorder)    Well-controlled with Effexor      Relevant Medications   venlafaxine XR (EFFEXOR-XR) 150 MG 24 hr capsule   Meds ordered this encounter  Medications   venlafaxine XR (EFFEXOR-XR) 150 MG 24 hr capsule    Sig: Take 1 capsule (150 mg total) by mouth daily.    Dispense:  90 capsule    Refill:  3    Follow-up: Return in about 6 months (around 05/17/2023) for CAD and HTN.    Lindell Spar, MD

## 2022-11-16 NOTE — Assessment & Plan Note (Signed)
S/p PCI X 2 On Aspirin, Plavix and Leqvio No chest pain or dyspnea currently Followed by Cardiology

## 2022-11-16 NOTE — Assessment & Plan Note (Signed)
Well-controlled with Effexor

## 2022-11-16 NOTE — Assessment & Plan Note (Signed)
Stable On Cinacalcet Followed by Dr Dorris Fetch

## 2022-11-16 NOTE — Assessment & Plan Note (Signed)
Right shoulder pain likely due to biceps tendinitis Tylenol as needed for pain, avoid oral NSAIDs as he takes Plavix and has h/o CAD Referred to OT If persistent pain, will refer to Orthopedic surgeon

## 2022-11-17 ENCOUNTER — Ambulatory Visit (INDEPENDENT_AMBULATORY_CARE_PROVIDER_SITE_OTHER): Payer: Medicare Other | Admitting: Urology

## 2022-11-17 ENCOUNTER — Encounter: Payer: Self-pay | Admitting: Urology

## 2022-11-17 VITALS — BP 122/62 | HR 59

## 2022-11-17 DIAGNOSIS — N401 Enlarged prostate with lower urinary tract symptoms: Secondary | ICD-10-CM

## 2022-11-17 DIAGNOSIS — N138 Other obstructive and reflux uropathy: Secondary | ICD-10-CM

## 2022-11-17 DIAGNOSIS — R350 Frequency of micturition: Secondary | ICD-10-CM | POA: Diagnosis not present

## 2022-11-17 DIAGNOSIS — R339 Retention of urine, unspecified: Secondary | ICD-10-CM

## 2022-11-17 DIAGNOSIS — R972 Elevated prostate specific antigen [PSA]: Secondary | ICD-10-CM

## 2022-11-17 LAB — URINALYSIS, ROUTINE W REFLEX MICROSCOPIC
Bilirubin, UA: NEGATIVE
Glucose, UA: NEGATIVE
Ketones, UA: NEGATIVE
Leukocytes,UA: NEGATIVE
Nitrite, UA: NEGATIVE
Specific Gravity, UA: 1.015 (ref 1.005–1.030)
Urobilinogen, Ur: 0.2 mg/dL (ref 0.2–1.0)
pH, UA: 6 (ref 5.0–7.5)

## 2022-11-17 LAB — MICROSCOPIC EXAMINATION: Bacteria, UA: NONE SEEN

## 2022-11-17 MED ORDER — TAMSULOSIN HCL 0.4 MG PO CAPS: 0.4000 mg | ORAL_CAPSULE | Freq: Two times a day (BID) | ORAL | 3 refills | Status: DC

## 2022-11-17 NOTE — Progress Notes (Signed)
11/17/2022 8:53 AM   Billy Hunt 18-Jul-1946 [id number]  Referring provider: Lindell Spar, MD [address] [place],  [place] [address]  Followup BPH and elevated PSA   HPI: Billy Hunt is a 77yo here for followup for elevated PSA and BPH. IPSS 12 QOL 1 on flomax 0.'4mg'$  BID. Nocturia 3x. Urine stream strong. PVR 119cc. No recent PSA. His PSA in 04/2022 was 6.2. He has a hx of a TURP in 2017 at [facility].    [place]: Past Medical History:  Diagnosis Date   Acute coronary syndrome ([place]) 07/04/2021   Allergy 1976   Anxiety    BPH (benign prostatic hyperplasia)    Gout    Hypercholesterolemia    Hyperparathyroidism ([place])    Hypertension    Myocardial infarction ([place]) 06/2021   Neuromuscular disorder ([place]) 2021   Osteoporosis 2021   Pulmonary emboli ([place])    Vitamin D deficiency    Vitamin D deficiency disease 10/07/2019    Surgical History: Past Surgical History:  Procedure Laterality Date   CORONARY STENT INTERVENTION N/A 07/04/2021   Procedure: CORONARY STENT INTERVENTION;  Surgeon: Nelva Bush, MD;  Location: [facility];  Service: Cardiovascular;  Laterality: N/A;   CORONARY STENT INTERVENTION N/A 07/06/2021   Procedure: CORONARY STENT INTERVENTION;  Surgeon: Troy Sine, MD;  Location: [facility];  Service: Cardiovascular;  Laterality: N/A;   LEFT HEART CATH AND CORONARY ANGIOGRAPHY N/A 07/04/2021   Procedure: LEFT HEART CATH AND CORONARY ANGIOGRAPHY;  Surgeon: Nelva Bush, MD;  Location: [facility];  Service: Cardiovascular;  Laterality: N/A;   PERIPHERAL VASCULAR THROMBECTOMY     TRANSURETHRAL RESECTION OF PROSTATE     VASECTOMY      Home Medications:  Allergies as of 11/17/2022       Reactions   Sulfa Antibiotics Hives   Sulfamethoxazole Rash        Medication List        Accurate as of November 17, 2022  8:53 AM. If you have any questions, ask your nurse or doctor.          albuterol 108 (90 Base) MCG/ACT  inhaler Commonly known as: VENTOLIN HFA Inhale 2 puffs into the lungs every 6 (six) hours as needed for wheezing or shortness of breath.   cholecalciferol 25 MCG (1000 UNIT) tablet Commonly known as: VITAMIN D3 Take 1,000 Units by mouth daily.   cinacalcet 30 MG tablet Commonly known as: SENSIPAR TAKE 1 TABLET BY MOUTH TWICE DAILY WITH A MEAL   clopidogrel 75 MG tablet Commonly known as: PLAVIX Take one (1) tablet by mouth (75 mg) daily.   Leqvio 284 MG/1.5ML Sosy injection Generic drug: inclisiran Inject 1.5 mLs (284 mg total) into the skin. Every 6 months   nitroGLYCERIN 0.4 MG SL tablet Commonly known as: NITROSTAT Place 1 tablet (0.4 mg total) under the tongue every 5 (five) minutes x 3 doses as needed for chest pain.   rosuvastatin 5 MG tablet Commonly known as: CRESTOR Take 1 tablet (5 mg total) by mouth daily.   simvastatin 20 MG tablet Commonly known as: ZOCOR Take by mouth.   tamsulosin 0.4 MG Caps capsule Commonly known as: FLOMAX Take 1 capsule (0.4 mg total) by mouth 2 (two) times daily.   venlafaxine XR 150 MG 24 hr capsule Commonly known as: EFFEXOR-XR Take 1 capsule (150 mg total) by mouth daily.        Allergies:  Allergies  Allergen Reactions  Sulfa Antibiotics Hives   Sulfamethoxazole Rash    Family History: Family History  Problem Relation Age of Onset   Alzheimer's disease Mother        Vascular Dementia   Stroke Mother    Breast cancer Mother    Cancer Father        Multiple Myeloma   Hypertension Father    Deep vein thrombosis Brother    Heart disease Brother    Cancer Paternal Grandmother    Alcohol abuse Paternal Grandfather     Social History:  reports that he quit smoking about 51 years ago. His smoking use included cigarettes. He has a 10.00 pack-year smoking history. He has never used smokeless tobacco. He reports current drug use. Drug: Marijuana. He reports that he does not drink alcohol.  ROS: All other review of  systems were reviewed and are negative except what is noted above in HPI  Physical Exam: BP 122/62   Pulse (!) 59   Constitutional:  Alert and oriented, No acute distress. HEENT: [place] AT, moist mucus membranes.  Trachea midline, no masses. Cardiovascular: No clubbing, cyanosis, or edema. Respiratory: Normal respiratory effort, no increased work of breathing. GI: Abdomen is soft, nontender, nondistended, no abdominal masses GU: No CVA tenderness.  Lymph: No cervical or inguinal lymphadenopathy. Skin: No rashes, bruises or suspicious lesions. Neurologic: Grossly intact, no focal deficits, moving all 4 extremities. Psychiatric: Normal mood and affect.  Laboratory Data: Lab Results  Component Value Date   WBC 5.1 05/10/2022   HGB 15.0 05/10/2022   HCT 45.5 05/10/2022   MCV 92 05/10/2022   PLT 214 05/10/2022    Lab Results  Component Value Date   CREATININE 1.06 05/10/2022    No results found for: "PSA"  No results found for: "TESTOSTERONE"  Lab Results  Component Value Date   HGBA1C 5.6 05/10/2022    Urinalysis    Component Value Date/Time   COLORURINE YELLOW 06/12/2015 0855   APPEARANCEUR Clear 08/07/2022 0958   LABSPEC 1.010 06/12/2015 0855   PHURINE 6.0 06/12/2015 0855   GLUCOSEU Negative 08/07/2022 0958   HGBUR LARGE (A) 06/12/2015 0855   BILIRUBINUR Negative 08/07/2022 0958   KETONESUR small (15) (A) 12/01/2021 0920   KETONESUR NEGATIVE 06/12/2015 0855   PROTEINUR Negative 08/07/2022 0958   PROTEINUR NEGATIVE 06/12/2015 0855   UROBILINOGEN 4.0 (A) 12/01/2021 0920   UROBILINOGEN 0.2 06/12/2015 0855   NITRITE Negative 08/07/2022 0958   NITRITE NEGATIVE 06/12/2015 0855   LEUKOCYTESUR Trace (A) 08/07/2022 0958    Lab Results  Component Value Date   LABMICR See below: 08/07/2022   WBCUA 0-5 08/07/2022   LABEPIT 0-10 08/07/2022   MUCUS Present 05/02/2022   BACTERIA None seen 08/07/2022    Pertinent Imaging:  No results found for this or any previous  visit.  No results found for this or any previous visit.  No results found for this or any previous visit.  No results found for this or any previous visit.  No results found for this or any previous visit.  No valid procedures specified. Results for orders placed during the hospital encounter of 12/09/21  CT HEMATURIA WORKUP  Narrative CLINICAL DATA:  Gross hematuria for 2 weeks, elevated PSA, history of BPH and TURP  EXAM: CT ABDOMEN AND PELVIS WITHOUT AND WITH CONTRAST  TECHNIQUE: Multidetector CT imaging of the abdomen and pelvis was performed following the standard protocol before and following the bolus administration of intravenous contrast.  RADIATION DOSE REDUCTION: This exam was  performed according to the departmental dose-optimization program which includes automated exposure control, adjustment of the mA and/or kV according to patient size and/or use of iterative reconstruction technique.  CONTRAST:  132m OMNIPAQUE IOHEXOL 300 MG/ML  SOLN  COMPARISON:  None.  FINDINGS: Lower chest: No acute abnormality. Eventration of the posterior right hemidiaphragm with associated scarring and atelectasis. Coronary artery calcifications. Small hiatal hernia.  Hepatobiliary: No solid liver abnormality is seen. No gallstones, gallbladder wall thickening, or biliary dilatation.  Pancreas: Unremarkable. No pancreatic ductal dilatation or surrounding inflammatory changes.  Spleen: Normal in size without significant abnormality.  Adrenals/Urinary Tract: Adrenal glands are unremarkable. The right kidney is in a very high position due to eventration of the right hemidiaphragm. Punctuate nonobstructive calculi of the superior pole of the right kidney (series 3, image 6). No left-sided calculi, ureteral calculi, or hydronephrosis. No suspicious mass or contrast enhancement. Simple, benign cyst of the posterior midportion of the left kidney, for which no further follow-up  or characterization is required. No urinary tract filling defect on delayed phase imaging. Bladder is unremarkable.  Stomach/Bowel: Stomach is within normal limits. Appendix appears normal. No evidence of bowel wall thickening, distention, or inflammatory changes. Descending and sigmoid diverticulosis. Large burden of stool throughout the colon and rectum.  Vascular/Lymphatic: Aortic atherosclerosis. No enlarged abdominal or pelvic lymph nodes.  Reproductive: Severe prostatomegaly with TURP defect.  Other: No abdominal wall hernia or abnormality. No ascites.  Musculoskeletal: No acute or significant osseous findings.  IMPRESSION: 1. Punctuate nonobstructive calculi of the superior pole of the right kidney. No left-sided calculi, ureteral calculi, or hydronephrosis. 2. No suspicious mass or contrast enhancement. No urinary tract filling defect on delayed phase imaging. 3. Severe prostatomegaly with TURP defect. 4. Descending and sigmoid diverticulosis without evidence of acute diverticulitis. 5. Coronary artery disease.  Aortic Atherosclerosis (ICD10-I70.0).   Electronically Signed By: ADelanna AhmadiM.D. On: 12/10/2021 11:00  No results found for this or any previous visit.   Assessment & Plan:    1. Incomplete bladder emptying -Continue flomax 0.'4mg'$  BID - BLADDER SCAN AMB NON-IMAGING  2. BPH with obstruction/lower urinary tract symptoms -Continue flomax 0.'4mg'$  BID - Urinalysis, Routine w reflex microscopic  3. Elevated PSA -PSA today. If his PSA is stable he will followup in 6 months with a PSA   No follow-ups on file.  PNicolette Bang MD  [facility] [place]

## 2022-11-17 NOTE — Progress Notes (Signed)
post void residual=119

## 2022-11-17 NOTE — Patient Instructions (Signed)

## 2022-11-18 LAB — PSA: Prostate Specific Ag, Serum: 5.1 ng/mL — ABNORMAL HIGH (ref 0.0–4.0)

## 2022-11-23 ENCOUNTER — Encounter (HOSPITAL_COMMUNITY): Payer: Self-pay | Admitting: Occupational Therapy

## 2022-11-23 ENCOUNTER — Ambulatory Visit (HOSPITAL_COMMUNITY): Payer: Medicare Other | Attending: Internal Medicine | Admitting: Occupational Therapy

## 2022-11-23 DIAGNOSIS — M25511 Pain in right shoulder: Secondary | ICD-10-CM | POA: Diagnosis not present

## 2022-11-23 DIAGNOSIS — R29898 Other symptoms and signs involving the musculoskeletal system: Secondary | ICD-10-CM | POA: Diagnosis not present

## 2022-11-23 DIAGNOSIS — M7521 Bicipital tendinitis, right shoulder: Secondary | ICD-10-CM | POA: Diagnosis not present

## 2022-11-23 NOTE — Therapy (Signed)
OUTPATIENT OCCUPATIONAL THERAPY ORTHO EVALUATION  Patient Name: Billy Hunt MRN: [id number] DOB:Aug 10, 1946, 77 y.o., male Today's Date: 11/24/2022  PCP: Lindell Spar, MD REFERRING PROVIDER: Lindell Spar, MD  END OF SESSION:  OT End of Session - 11/24/22 1001     Visit Number 1    Number of Visits 5    Date for OT Re-Evaluation 12/30/22    Authorization Type Medicare A and B    OT Start Time 1520    OT Stop Time 1600    OT Time Calculation (min) 40 min    Activity Tolerance Patient tolerated treatment well    Behavior During Therapy [facility] for tasks assessed/performed             Past Medical History:  Diagnosis Date   Acute coronary syndrome ([place]) 07/04/2021   Allergy 1976   Anxiety    BPH (benign prostatic hyperplasia)    Gout    Hypercholesterolemia    Hyperparathyroidism ([place])    Hypertension    Myocardial infarction ([place]) 06/2021   Neuromuscular disorder ([place]) 2021   Osteoporosis 2021   Pulmonary emboli ([place])    Vitamin D deficiency    Vitamin D deficiency disease 10/07/2019   Past Surgical History:  Procedure Laterality Date   CORONARY STENT INTERVENTION N/A 07/04/2021   Procedure: CORONARY STENT INTERVENTION;  Surgeon: Nelva Bush, MD;  Location: [place] [facility];  Service: Cardiovascular;  Laterality: N/A;   CORONARY STENT INTERVENTION N/A 07/06/2021   Procedure: CORONARY STENT INTERVENTION;  Surgeon: Troy Sine, MD;  Location: [place] [facility];  Service: Cardiovascular;  Laterality: N/A;   LEFT HEART CATH AND CORONARY ANGIOGRAPHY N/A 07/04/2021   Procedure: LEFT HEART CATH AND CORONARY ANGIOGRAPHY;  Surgeon: Nelva Bush, MD;  Location: [place] [facility];  Service: Cardiovascular;  Laterality: N/A;   PERIPHERAL VASCULAR THROMBECTOMY     TRANSURETHRAL RESECTION OF PROSTATE     VASECTOMY     Patient Active Problem List   Diagnosis Date Noted   Biceps tendonitis on right 11/14/2022   Bruising 05/16/2022   Prediabetes  04/10/2022   Vitamin D deficiency 04/10/2022   Gross hematuria 12/27/2021   Acute cystitis with hematuria 12/04/2021   Chronic cough 07/21/2021   Coronary artery disease involving native coronary artery of native heart with unstable angina pectoris (HCC)    NSTEMI (non-ST elevated myocardial infarction) ([place]) 07/04/2021   GAD (generalized anxiety disorder) 06/01/2021   Idiopathic peripheral neuropathy 06/01/2021   BPH with obstruction/lower urinary tract symptoms 10/07/2019   Hyperparathyroidism, primary ([place]) 11/20/2018   Osteopenia 11/20/2018   Anterior urethral stricture 06/22/2015   Elevated PSA 06/22/2015   Erectile dysfunction 06/22/2015   History of pulmonary embolus (PE) 04/21/2015   HTN (hypertension) 04/21/2015   Gout 04/21/2015   Mixed hyperlipidemia 04/21/2015    ONSET DATE: December 2023  REFERRING DIAG: Bicep Tendonitis  THERAPY DIAG:  Acute pain of right shoulder  Other symptoms and signs involving the musculoskeletal system  Rationale for Evaluation and Treatment: Rehabilitation  SUBJECTIVE:   SUBJECTIVE STATEMENT: "It's been a slow build up of pain." Pt accompanied by: self  PERTINENT HISTORY: Pt reports that since Oct. 2023 he has noticed some pain in his R arm, by Dec 2023, he reports that the pain has become constant with reaching and moving his arm out from his body. No injections or surgeries have been performed at this time.   PRECAUTIONS: None  WEIGHT BEARING RESTRICTIONS: No  PAIN:  Are you  having pain? Yes: NPRS scale: 5-6/10 Pain location: Posterior deltoid/triceps Pain description: achy Aggravating factors: movements (abduction) Relieving factors: Tylenol  FALLS: Has patient fallen in last 6 months? No  LIVING ENVIRONMENT: Lives with: lives with an adult companion Lives in: House/apartment  PLOF: Independent  PATIENT GOALS: To stop the pain  NEXT MD VISIT: None  OBJECTIVE:   HAND DOMINANCE: Right  ADLs: Overall ADLs: Pt  reports difficulty with IADL's, especially cooking, cleaning, and yard work due to difficulty lifting and managing items.   FUNCTIONAL OUTCOME MEASURES: FOTO: 64.54  UPPER EXTREMITY ROM:     Active ROM Right eval  Shoulder flexion 153  Shoulder abduction 170  Shoulder internal rotation 90  Shoulder external rotation 73  (Blank rows = not tested)  UPPER EXTREMITY MMT:     MMT Right eval  Shoulder flexion 5/5  Shoulder abduction 5/5  Shoulder adduction 5/5  Shoulder extension 5/5  Shoulder internal rotation 5/5  Shoulder external rotation 4/5  Elbow flexion 5/5  Elbow extension 5/5  (Blank rows = not tested)  HAND FUNCTION: Grip strength: Right: 66 lbs; Left: 60 lbs  EDEMA: No swelling noted  OBSERVATIONS: Severe fascial restrictions along the biceps, axillary region, scapularis.    TODAY'S TREATMENT:                                                                                                                              DATE: 11/23/22: Evaluation Only    PATIENT EDUCATION: Education details: Wall Slides Person educated: Patient Education method: Explanation and Demonstration Education comprehension: verbalized understanding and returned demonstration  HOME EXERCISE PROGRAM: 3/14: Wall Slides  GOALS: Goals reviewed with patient? Yes  SHORT TERM GOALS: Target date: 12/30/22  Pt will be provided with and educated on HEP to improve mobility in RUE required for use during ADL completion.   Goal status: INITIAL  2. Pt will decrease pain in RUE to 3/10 or less to improve ability to sleep for 2+ consecutive hours without waking due to pain.   Goal status: INITIAL   3.  Pt will decrease RUE fascial restrictions to min amounts or less to improve mobility required for functional reaching tasks.   Goal status: INITIAL  4.  Pt will increase RUE strength to 5/5 to improve ability to lift and carry items during meal preparation, housework, and yardwork  tasks.  Goal status: INITIAL  5.  Pt will return to highest level of function using RUE as dominant during functional task completion.   Goal status: INITIAL    ASSESSMENT:  CLINICAL IMPRESSION: Patient is a 77 y.o. male who was seen today for occupational therapy evaluation for R bicep tendonitis with deltoid involvement. He reports severe pain and ADL/IADL limitations due to the pain when attempting to move his arm and lift objects.    PERFORMANCE DEFICITS: in functional skills including ADLs, IADLs, ROM, strength, pain, fascial restrictions, Gross motor control, body mechanics, and UE functional use.  IMPAIRMENTS: are limiting patient from ADLs, IADLs, rest and sleep, work, leisure, and social participation.   COMORBIDITIES: has no other co-morbidities that affects occupational performance. Patient will benefit from skilled OT to address above impairments and improve overall function.  MODIFICATION OR ASSISTANCE TO COMPLETE EVALUATION: No modification of tasks or assist necessary to complete an evaluation.  OT OCCUPATIONAL PROFILE AND HISTORY: Problem focused assessment: Including review of records relating to presenting problem.  CLINICAL DECISION MAKING: LOW - limited treatment options, no task modification necessary  REHAB POTENTIAL: Excellent  EVALUATION COMPLEXITY: Low      PLAN:  OT FREQUENCY: 1x/week  OT DURATION: 4 weeks  PLANNED INTERVENTIONS: self care/ADL training, therapeutic exercise, therapeutic activity, manual therapy, passive range of motion, functional mobility training, electrical stimulation, ultrasound, moist heat, cryotherapy, patient/family education, and DME and/or AE instructions  RECOMMENDED OTHER SERVICES: N/A  CONSULTED AND AGREED WITH PLAN OF CARE: Patient  PLAN FOR NEXT SESSION: Stretches, A/ROM, Bicep strengthening, shoulder strengthening  [facility] [place],  [place] 11/24/2022, 10:07 AM

## 2022-11-29 ENCOUNTER — Ambulatory Visit (HOSPITAL_COMMUNITY): Payer: Medicare Other | Admitting: Occupational Therapy

## 2022-11-29 ENCOUNTER — Encounter (HOSPITAL_COMMUNITY): Payer: Self-pay | Admitting: Occupational Therapy

## 2022-11-29 DIAGNOSIS — R29898 Other symptoms and signs involving the musculoskeletal system: Secondary | ICD-10-CM

## 2022-11-29 DIAGNOSIS — M25511 Pain in right shoulder: Secondary | ICD-10-CM

## 2022-11-29 DIAGNOSIS — M7521 Bicipital tendinitis, right shoulder: Secondary | ICD-10-CM | POA: Diagnosis not present

## 2022-11-29 NOTE — Therapy (Signed)
OUTPATIENT OCCUPATIONAL THERAPY ORTHO EVALUATION  Patient Name: Billy Hunt MRN: [id number] DOB:03-29-1946, 77 y.o., male Today's Date: 11/29/2022  PCP: Lindell Spar, MD REFERRING PROVIDER: Lindell Spar, MD  END OF SESSION:  OT End of Session - 11/29/22 1348     Visit Number 2    Number of Visits 5    Date for OT Re-Evaluation 12/30/22    Authorization Type Medicare A and B    OT Start Time 71    OT Stop Time [id number]    OT Time Calculation (min) 38 min    Activity Tolerance Patient tolerated treatment well    Behavior During Therapy [facility] for tasks assessed/performed              Past Medical History:  Diagnosis Date   Acute coronary syndrome ([place]) 07/04/2021   Allergy 1976   Anxiety    BPH (benign prostatic hyperplasia)    Gout    Hypercholesterolemia    Hyperparathyroidism ([place])    Hypertension    Myocardial infarction ([place]) 06/2021   Neuromuscular disorder ([place]) 2021   Osteoporosis 2021   Pulmonary emboli ([place])    Vitamin D deficiency    Vitamin D deficiency disease 10/07/2019   Past Surgical History:  Procedure Laterality Date   CORONARY STENT INTERVENTION N/A 07/04/2021   Procedure: CORONARY STENT INTERVENTION;  Surgeon: Nelva Bush, MD;  Location: [place] [facility];  Service: Cardiovascular;  Laterality: N/A;   CORONARY STENT INTERVENTION N/A 07/06/2021   Procedure: CORONARY STENT INTERVENTION;  Surgeon: Troy Sine, MD;  Location: [place] [facility];  Service: Cardiovascular;  Laterality: N/A;   LEFT HEART CATH AND CORONARY ANGIOGRAPHY N/A 07/04/2021   Procedure: LEFT HEART CATH AND CORONARY ANGIOGRAPHY;  Surgeon: Nelva Bush, MD;  Location: [place] [facility];  Service: Cardiovascular;  Laterality: N/A;   PERIPHERAL VASCULAR THROMBECTOMY     TRANSURETHRAL RESECTION OF PROSTATE     VASECTOMY     Patient Active Problem List   Diagnosis Date Noted   Biceps tendonitis on right 11/14/2022   Bruising 05/16/2022   Prediabetes  04/10/2022   Vitamin D deficiency 04/10/2022   Gross hematuria 12/27/2021   Acute cystitis with hematuria 12/04/2021   Chronic cough 07/21/2021   Coronary artery disease involving native coronary artery of native heart with unstable angina pectoris (HCC)    NSTEMI (non-ST elevated myocardial infarction) ([place]) 07/04/2021   GAD (generalized anxiety disorder) 06/01/2021   Idiopathic peripheral neuropathy 06/01/2021   BPH with obstruction/lower urinary tract symptoms 10/07/2019   Hyperparathyroidism, primary ([place]) 11/20/2018   Osteopenia 11/20/2018   Anterior urethral stricture 06/22/2015   Elevated PSA 06/22/2015   Erectile dysfunction 06/22/2015   History of pulmonary embolus (PE) 04/21/2015   HTN (hypertension) 04/21/2015   Gout 04/21/2015   Mixed hyperlipidemia 04/21/2015    ONSET DATE: December 2023  REFERRING DIAG: Bicep Tendonitis  THERAPY DIAG:  Acute pain of right shoulder  Other symptoms and signs involving the musculoskeletal system  Rationale for Evaluation and Treatment: Rehabilitation  SUBJECTIVE:   SUBJECTIVE STATEMENT: "It's getting better."  PERTINENT HISTORY: Pt reports that since Oct. 2023 he has noticed some pain in his R arm, by Dec 2023, he reports that the pain has become constant with reaching and moving his arm out from his body. No injections or surgeries have been performed at this time.   PRECAUTIONS: None  WEIGHT BEARING RESTRICTIONS: No  PAIN:  Are you having pain? Yes: NPRS scale: 4/10 Pain location:  Posterior deltoid/triceps Pain description: achy Aggravating factors: movements (abduction) Relieving factors: Tylenol  FALLS: Has patient fallen in last 6 months? No   PATIENT GOALS: To stop the pain   OBJECTIVE:   HAND DOMINANCE: Right  ADLs: Overall ADLs: Pt reports difficulty with IADL's, especially cooking, cleaning, and yard work due to difficulty lifting and managing items.   FUNCTIONAL OUTCOME MEASURES: FOTO:  64.54  UPPER EXTREMITY ROM:     Active ROM Right eval  Shoulder flexion 153  Shoulder abduction 170  Shoulder internal rotation 90  Shoulder external rotation 73  (Blank rows = not tested)  UPPER EXTREMITY MMT:     MMT Right eval  Shoulder flexion 5/5  Shoulder abduction 5/5  Shoulder adduction 5/5  Shoulder extension 5/5  Shoulder internal rotation 5/5  Shoulder external rotation 4/5  Elbow flexion 5/5  Elbow extension 5/5  (Blank rows = not tested)  HAND FUNCTION: Grip strength: Right: 66 lbs; Left: 60 lbs  OBSERVATIONS: Severe fascial restrictions along the biceps, axillary region, scapularis.    TODAY'S TREATMENT:                                                                                                                              DATE:  11/29/22 -Myofascial release: myofascial release to right upper arm and trapezius regions to decrease pain and fascial restrictions and increase joint ROM -Shoulder strengthening: protraction, flexion, horizontal abduction, er/IR, abduction, 10 reps -Proximal shoulder strengthening: paddles, criss cross, circles each direction, 10 reps each -Ball on wall: 1' flexion, 1' abduction -Therapy ball strengthening: chest press, flexion, circles each direction, overhead press, 10 reps each  -Theraband strengthening: green band-protraction, flexion, horizontal abduction, abduction, er, 10 reps each -Scapular theraband: green-row, extension, retraction, 10 reps each   PATIENT EDUCATION: Education details: theraband strengthening-green Person educated: Patient Education method: Explanation and Demonstration Education comprehension: verbalized understanding and returned demonstration  HOME EXERCISE PROGRAM: 3/14: Wall Slides 3/20: Theraband strengthening-green  GOALS: Goals reviewed with patient? Yes  SHORT TERM GOALS: Target date: 12/30/22  Pt will be provided with and educated on HEP to improve mobility in RUE required for use  during ADL completion.   Goal status: IN PROGRESS  2. Pt will decrease pain in RUE to 3/10 or less to improve ability to sleep for 2+ consecutive hours without waking due to pain.   Goal status: IN PROGRESS   3.  Pt will decrease RUE fascial restrictions to min amounts or less to improve mobility required for functional reaching tasks.   Goal status: IN PROGRESS  4.  Pt will increase RUE strength to 5/5 to improve ability to lift and carry items during meal preparation, housework, and yardwork tasks.  Goal status: IN PROGRESS  5.  Pt will return to highest level of function using RUE as dominant during functional task completion.   Goal status: IN PROGRESS   ASSESSMENT:  CLINICAL IMPRESSION: Patient reports he has been completing his stretches  and using heat and massage, decreased pain since evaluation. Initiated myofascial release however pt presenting with min restrictions today compared to severe on evaluation. Initiated BUE strengthening using 2# weights, therapy ball strengthening, green theraband strengthening, and proximal shoulder strengthening. Pt reporting his arm feels good at end of session, no increased pain. Verbal cuing for form and technique.   PERFORMANCE DEFICITS: in functional skills including ADLs, IADLs, ROM, strength, pain, fascial restrictions, Gross motor control, body mechanics, and UE functional use.   PLAN:  OT FREQUENCY: 1x/week  OT DURATION: 4 weeks  PLANNED INTERVENTIONS: self care/ADL training, therapeutic exercise, therapeutic activity, manual therapy, passive range of motion, functional mobility training, electrical stimulation, ultrasound, moist heat, cryotherapy, patient/family education, and DME and/or AE instructions   CONSULTED AND AGREED WITH PLAN OF CARE: Patient  PLAN FOR NEXT SESSION: Follow up on HEP, RUE strengthening, overhead lacing    Guadelupe Sabin, OTR/L  [phone] 11/29/2022, 1:49 PM

## 2022-11-29 NOTE — Patient Instructions (Signed)

## 2022-12-06 ENCOUNTER — Ambulatory Visit (HOSPITAL_COMMUNITY)
Admission: RE | Admit: 2022-12-06 | Discharge: 2022-12-06 | Disposition: A | Discharge status: Home / Self Care | Payer: Medicare Other | Source: Ambulatory Visit | Attending: "Endocrinology | Admitting: "Endocrinology

## 2022-12-06 DIAGNOSIS — E210 Primary hyperparathyroidism: Secondary | ICD-10-CM | POA: Insufficient documentation

## 2022-12-06 DIAGNOSIS — M85852 Other specified disorders of bone density and structure, left thigh: Secondary | ICD-10-CM | POA: Diagnosis not present

## 2022-12-07 ENCOUNTER — Encounter (HOSPITAL_COMMUNITY): Payer: Self-pay | Admitting: Occupational Therapy

## 2022-12-07 ENCOUNTER — Ambulatory Visit (HOSPITAL_COMMUNITY): Payer: Medicare Other | Admitting: Occupational Therapy

## 2022-12-07 DIAGNOSIS — M7521 Bicipital tendinitis, right shoulder: Secondary | ICD-10-CM | POA: Diagnosis not present

## 2022-12-07 DIAGNOSIS — M25511 Pain in right shoulder: Secondary | ICD-10-CM | POA: Diagnosis not present

## 2022-12-07 DIAGNOSIS — R29898 Other symptoms and signs involving the musculoskeletal system: Secondary | ICD-10-CM | POA: Diagnosis not present

## 2022-12-07 NOTE — Therapy (Signed)
OUTPATIENT OCCUPATIONAL THERAPY ORTHO TREATMENT  Patient Name: SALEH BLOODSAW MRN: [id number] DOB:1946/01/01, 77 y.o., male Today's Date: 12/07/2022  PCP: Lindell Spar, MD REFERRING PROVIDER: Lindell Spar, MD  END OF SESSION:  OT End of Session - 12/07/22 1341     Visit Number 3    Number of Visits 5    Date for OT Re-Evaluation 12/30/22    Authorization Type Medicare A and B    OT Start Time 101    OT Stop Time 1341    OT Time Calculation (min) 39 min    Activity Tolerance Patient tolerated treatment well    Behavior During Therapy [facility] for tasks assessed/performed               Past Medical History:  Diagnosis Date   Acute coronary syndrome ([place]) 07/04/2021   Allergy 1976   Anxiety    BPH (benign prostatic hyperplasia)    Gout    Hypercholesterolemia    Hyperparathyroidism ([place])    Hypertension    Myocardial infarction ([place]) 06/2021   Neuromuscular disorder ([place]) 2021   Osteoporosis 2021   Pulmonary emboli ([place])    Vitamin D deficiency    Vitamin D deficiency disease 10/07/2019   Past Surgical History:  Procedure Laterality Date   CORONARY STENT INTERVENTION N/A 07/04/2021   Procedure: CORONARY STENT INTERVENTION;  Surgeon: Nelva Bush, MD;  Location: [place] [facility];  Service: Cardiovascular;  Laterality: N/A;   CORONARY STENT INTERVENTION N/A 07/06/2021   Procedure: CORONARY STENT INTERVENTION;  Surgeon: Troy Sine, MD;  Location: [place] [facility];  Service: Cardiovascular;  Laterality: N/A;   LEFT HEART CATH AND CORONARY ANGIOGRAPHY N/A 07/04/2021   Procedure: LEFT HEART CATH AND CORONARY ANGIOGRAPHY;  Surgeon: Nelva Bush, MD;  Location: [place] [facility];  Service: Cardiovascular;  Laterality: N/A;   PERIPHERAL VASCULAR THROMBECTOMY     TRANSURETHRAL RESECTION OF PROSTATE     VASECTOMY     Patient Active Problem List   Diagnosis Date Noted   Biceps tendonitis on right 11/14/2022   Bruising 05/16/2022   Prediabetes  04/10/2022   Vitamin D deficiency 04/10/2022   Gross hematuria 12/27/2021   Acute cystitis with hematuria 12/04/2021   Chronic cough 07/21/2021   Coronary artery disease involving native coronary artery of native heart with unstable angina pectoris (HCC)    NSTEMI (non-ST elevated myocardial infarction) ([place]) 07/04/2021   GAD (generalized anxiety disorder) 06/01/2021   Idiopathic peripheral neuropathy 06/01/2021   BPH with obstruction/lower urinary tract symptoms 10/07/2019   Hyperparathyroidism, primary ([place]) 11/20/2018   Osteopenia 11/20/2018   Anterior urethral stricture 06/22/2015   Elevated PSA 06/22/2015   Erectile dysfunction 06/22/2015   History of pulmonary embolus (PE) 04/21/2015   HTN (hypertension) 04/21/2015   Gout 04/21/2015   Mixed hyperlipidemia 04/21/2015    ONSET DATE: December 2023  REFERRING DIAG: Bicep Tendonitis  THERAPY DIAG:  Acute pain of right shoulder  Other symptoms and signs involving the musculoskeletal system  Rationale for Evaluation and Treatment: Rehabilitation  SUBJECTIVE:   SUBJECTIVE STATEMENT: "It's getting better, it doesn't hurt as much."   PERTINENT HISTORY: Pt reports that since Oct. 2023 he has noticed some pain in his R arm, by Dec 2023, he reports that the pain has become constant with reaching and moving his arm out from his body. No injections or surgeries have been performed at this time.   PRECAUTIONS: None  WEIGHT BEARING RESTRICTIONS: No  PAIN:  Are you having  pain? Yes: NPRS scale: 1/10 Pain location: Posterior deltoid/triceps Pain description: achy Aggravating factors: movements (abduction) Relieving factors: Tylenol  FALLS: Has patient fallen in last 6 months? No   PATIENT GOALS: To stop the pain   OBJECTIVE:   HAND DOMINANCE: Right  ADLs: Overall ADLs: Pt reports difficulty with IADL's, especially cooking, cleaning, and yard work due to difficulty lifting and managing items.   FUNCTIONAL OUTCOME  MEASURES: FOTO: 64.54  UPPER EXTREMITY ROM:     Active ROM Right eval  Shoulder flexion 153  Shoulder abduction 170  Shoulder internal rotation 90  Shoulder external rotation 73  (Blank rows = not tested)  UPPER EXTREMITY MMT:     MMT Right eval  Shoulder flexion 5/5  Shoulder abduction 5/5  Shoulder adduction 5/5  Shoulder extension 5/5  Shoulder internal rotation 5/5  Shoulder external rotation 4/5  Elbow flexion 5/5  Elbow extension 5/5  (Blank rows = not tested)  HAND FUNCTION: Grip strength: Right: 66 lbs; Left: 60 lbs  OBSERVATIONS: Severe fascial restrictions along the biceps, axillary region, scapularis.    TODAY'S TREATMENT:                                                                                                                              DATE:  12/07/22 -Myofascial release: myofascial release to right upper arm and trapezius regions to decrease pain and fascial restrictions and increase joint ROM -P/ROM: supine-flexion, abduction, horizontal abduction, 5 reps each -Shoulder strengthening: supine, 2#- protraction, flexion, horizontal abduction, er/IR, abduction, 10 reps -Proximal shoulder strengthening: supine, 2#- paddles, criss cross, circles each direction, 10 reps each -Theraband strengthening: sidelying, green band-protraction, flexion, horizontal abduction, abduction, er, 10 reps each -ABC writing: holding 2# weight at 90 degrees flexion, pt writing ABCs in air -X to V arms: standing, 10 reps -Overhead lacing: seated, lacing from top down then reversing, wearing 1# wrist weight -Ball on wall: 1' flexion, 1' abduction -UBE: level 2, 2' forward, 2' reverse, pace: 9.0  11/29/22 -Myofascial release: myofascial release to right upper arm and trapezius regions to decrease pain and fascial restrictions and increase joint ROM -Shoulder strengthening: protraction, flexion, horizontal abduction, er/IR, abduction, 10 reps -Proximal shoulder strengthening:  paddles, criss cross, circles each direction, 10 reps each -Ball on wall: 1' flexion, 1' abduction -Therapy ball strengthening: chest press, flexion, circles each direction, overhead press, 10 reps each  -Theraband strengthening: green band-protraction, flexion, horizontal abduction, abduction, er, 10 reps each -Scapular theraband: green-row, extension, retraction, 10 reps each   PATIENT EDUCATION: Education details: reviewed HEP Person educated: Patient Education method: Explanation and Demonstration Education comprehension: verbalized understanding and returned demonstration  HOME EXERCISE PROGRAM: 3/14: Wall Slides 3/20: Theraband strengthening-green  GOALS: Goals reviewed with patient? Yes  SHORT TERM GOALS: Target date: 12/30/22  Pt will be provided with and educated on HEP to improve mobility in RUE required for use during ADL completion.   Goal status: IN PROGRESS  2. Pt  will decrease pain in RUE to 3/10 or less to improve ability to sleep for 2+ consecutive hours without waking due to pain.   Goal status: IN PROGRESS   3.  Pt will decrease RUE fascial restrictions to min amounts or less to improve mobility required for functional reaching tasks.   Goal status: IN PROGRESS  4.  Pt will increase RUE strength to 5/5 to improve ability to lift and carry items during meal preparation, housework, and yardwork tasks.  Goal status: IN PROGRESS  5.  Pt will return to highest level of function using RUE as dominant during functional task completion.   Goal status: IN PROGRESS   ASSESSMENT:  CLINICAL IMPRESSION: Patient reports improvement in pain, HEP is going well. Continued with manual techniques then transitioned to strengthening. Initiated exercises using 2# weights and sidelying theraband task. Completed x to v arms and arnold press, overhead lacing and ball on wall for stability and activity tolerance. Verbal cuing for form and technique during tasks, pt reports no  increased pain.   PERFORMANCE DEFICITS: in functional skills including ADLs, IADLs, ROM, strength, pain, fascial restrictions, Gross motor control, body mechanics, and UE functional use.   PLAN:  OT FREQUENCY: 1x/week  OT DURATION: 4 weeks  PLANNED INTERVENTIONS: self care/ADL training, therapeutic exercise, therapeutic activity, manual therapy, passive range of motion, functional mobility training, electrical stimulation, ultrasound, moist heat, cryotherapy, patient/family education, and DME and/or AE instructions   CONSULTED AND AGREED WITH PLAN OF CARE: Patient  PLAN FOR NEXT SESSION: Follow up on HEP, RUE strengthening, overhead lacing    Guadelupe Sabin, OTR/L  [phone] 12/07/2022, 1:41 PM

## 2022-12-11 ENCOUNTER — Other Ambulatory Visit: Payer: Self-pay | Admitting: "Endocrinology

## 2022-12-13 ENCOUNTER — Ambulatory Visit (INDEPENDENT_AMBULATORY_CARE_PROVIDER_SITE_OTHER): Payer: Medicare Other | Admitting: "Endocrinology

## 2022-12-13 ENCOUNTER — Ambulatory Visit (HOSPITAL_COMMUNITY): Payer: Medicare Other | Attending: Internal Medicine | Admitting: Occupational Therapy

## 2022-12-13 ENCOUNTER — Encounter: Payer: Self-pay | Admitting: "Endocrinology

## 2022-12-13 ENCOUNTER — Encounter (HOSPITAL_COMMUNITY): Payer: Self-pay | Admitting: Occupational Therapy

## 2022-12-13 VITALS — BP 126/64 | HR 64 | Ht 69.0 in | Wt 160.2 lb

## 2022-12-13 DIAGNOSIS — M8580 Other specified disorders of bone density and structure, unspecified site: Secondary | ICD-10-CM

## 2022-12-13 DIAGNOSIS — E210 Primary hyperparathyroidism: Secondary | ICD-10-CM

## 2022-12-13 DIAGNOSIS — R7303 Prediabetes: Secondary | ICD-10-CM | POA: Diagnosis not present

## 2022-12-13 DIAGNOSIS — E559 Vitamin D deficiency, unspecified: Secondary | ICD-10-CM | POA: Diagnosis not present

## 2022-12-13 DIAGNOSIS — E782 Mixed hyperlipidemia: Secondary | ICD-10-CM

## 2022-12-13 DIAGNOSIS — M25511 Pain in right shoulder: Secondary | ICD-10-CM | POA: Diagnosis not present

## 2022-12-13 DIAGNOSIS — R29898 Other symptoms and signs involving the musculoskeletal system: Secondary | ICD-10-CM | POA: Insufficient documentation

## 2022-12-13 MED ORDER — CINACALCET HCL 30 MG PO TABS: 30.0000 mg | ORAL_TABLET | Freq: Every day | ORAL | 1 refills | Status: DC

## 2022-12-13 NOTE — Progress Notes (Signed)
12/13/2022, 3:59 PM  Endocrinology follow-up note   Billy Hunt is a 77 y.o.-year-old male, who is returning for follow-up after he was seen in consultation for hypercalcemia/hyperparathyroidism, osteopenia. PMD:   Lindell Spar, MD   Past Medical History:  Diagnosis Date   Acute coronary syndrome 07/04/2021   Allergy 1976   Anxiety    BPH (benign prostatic hyperplasia)    Gout    Hypercholesterolemia    Hyperparathyroidism    Hypertension    Myocardial infarction 06/2021   Neuromuscular disorder 2021   Osteoporosis 2021   Pulmonary emboli    Vitamin D deficiency    Vitamin D deficiency disease 10/07/2019    Past Surgical History:  Procedure Laterality Date   CORONARY STENT INTERVENTION N/A 07/04/2021   Procedure: CORONARY STENT INTERVENTION;  Surgeon: Nelva Bush, MD;  Location: [place] [facility];  Service: Cardiovascular;  Laterality: N/A;   CORONARY STENT INTERVENTION N/A 07/06/2021   Procedure: CORONARY STENT INTERVENTION;  Surgeon: Troy Sine, MD;  Location: [place] [facility];  Service: Cardiovascular;  Laterality: N/A;   LEFT HEART CATH AND CORONARY ANGIOGRAPHY N/A 07/04/2021   Procedure: LEFT HEART CATH AND CORONARY ANGIOGRAPHY;  Surgeon: Nelva Bush, MD;  Location: [place] [facility];  Service: Cardiovascular;  Laterality: N/A;   PERIPHERAL VASCULAR THROMBECTOMY     TRANSURETHRAL RESECTION OF PROSTATE     VASECTOMY      Social History   Tobacco Use   Smoking status: Former    Packs/day: 1.00    Years: 10.00    Additional pack years: 0.00    Total pack years: 10.00    Types: Cigarettes    Quit date: 04/21/1971    Years since quitting: 51.6   Smokeless tobacco: Never  Vaping Use   Vaping Use: Never used  Substance Use Topics   Alcohol use: No    Comment: former   Drug use: Yes    Types: Marijuana    Comment: last use: 9/30    Family History  Problem Relation Age of Onset   Alzheimer's disease Mother        Vascular  Dementia   Stroke Mother    Breast cancer Mother    Cancer Father        Multiple Myeloma   Hypertension Father    Deep vein thrombosis Brother    Heart disease Brother    Cancer Paternal Grandmother    Alcohol abuse Paternal Grandfather     Outpatient Encounter Medications as of 12/13/2022  Medication Sig   albuterol (VENTOLIN HFA) 108 (90 Base) MCG/ACT inhaler Inhale 2 puffs into the lungs every 6 (six) hours as needed for wheezing or shortness of breath.   cholecalciferol (VITAMIN D3) 25 MCG (1000 UT) tablet Take 1,000 Units by mouth daily.   cinacalcet (SENSIPAR) 30 MG tablet Take 1 tablet (30 mg total) by mouth daily with breakfast.   clopidogrel (PLAVIX) 75 MG tablet Take one (1) tablet by mouth (75 mg) daily.   inclisiran (LEQVIO) 284 MG/1.5ML SOSY injection Inject 1.5 mLs (284 mg total) into the skin. Every 6 months   nitroGLYCERIN (NITROSTAT) 0.4 MG SL tablet Place 1 tablet (0.4 mg total) under the tongue every 5 (five) minutes x 3 doses as needed for chest pain.   rosuvastatin (CRESTOR) 5 MG tablet Take 1 tablet (5 mg total) by mouth daily.   tamsulosin (FLOMAX) 0.4 MG CAPS capsule Take 1 capsule (0.4 mg total) by  mouth 2 (two) times daily.   venlafaxine XR (EFFEXOR-XR) 150 MG 24 hr capsule Take 1 capsule (150 mg total) by mouth daily.   [DISCONTINUED] cinacalcet (SENSIPAR) 30 MG tablet TAKE 1 TABLET BY MOUTH TWICE DAILY WITH A MEAL   [DISCONTINUED] simvastatin (ZOCOR) 20 MG tablet Take by mouth.   No facility-administered encounter medications on file as of 12/13/2022.    Allergies  Allergen Reactions   Sulfa Antibiotics Hives   Sulfamethoxazole Rash     HPI  [name] reports that he was diagnosed with hypercalcemia approximately 7 years ago with blood test showing high PTH and high calcium.  He denies any history of nephrolithiasis, seizure disorder, cardiac dysrhythmias.  Denies any history of CKD.   -He however was found to have osteopenia in 2014.  His repeat  bone density in [facility] on November 25, 2018 showed left femur osteopenia with a T score of -1.6, left forearm radius 33% T score of -0.2, spine excluded due to degenerative changes.   His bone density on November 26, 2020 shows similar findings still consistent with osteopenia. His most recent bone density in March 2024, showing worsening findings, still consistent with osteopenia. -He is currently not on calcium supplements, however on vitamin D3 2000 units daily.  His previsit labs show vitamin D replete at  53.9.   -He denies any family history of parathyroid, thyroid, pituitary, adrenal dysfunction.   -He returns with controlled calcium at 9.4.  This is associated with improving PTH of 52.   He is currently on Sensipar 30 mg p.o. twice daily.  - prior to his last visit, 24-hour urine calcium on November 22, 2018 was 221. He has no new complaints today.  No prior history of fragility fractures or falls.    He is a former  smoker.   he is not on HCTZ or other thiazide therapy.    His previsit labs show significant dyslipidemia, currently patient not on treatment.  He reports previous intolerance to Lipitor.   BP 126/64   Pulse 64   Ht 5\' 9"  (1.753 m)   Wt 160 lb 3.2 oz (72.7 kg)   BMI 23.66 kg/m , Body mass index is 23.66 kg/m.   CMP     Component Value Date/Time   NA 144 05/10/2022 0820   K 4.8 05/10/2022 0820   CL 105 05/10/2022 0820   CO2 24 05/10/2022 0820   GLUCOSE 95 05/10/2022 0820   GLUCOSE 137 (H) 07/05/2021 0320   BUN 12 05/10/2022 0820   CREATININE 1.06 05/10/2022 0820   CALCIUM 9.4 10/27/2022 0817   PROT 7.1 10/11/2022 0830   ALBUMIN 4.4 10/11/2022 0830   AST 18 10/11/2022 0830   ALT 11 10/11/2022 0830   ALKPHOS 102 10/11/2022 0830   BILITOT 0.6 10/11/2022 0830   GFRNONAA >60 07/05/2021 0320   GFRAA >60 07/27/2017 0100   Most recent official lab reports from September 17, 2018: 25-hydroxy vitamin D 43, PTH 76 elevated, calcium 11.4 mg per DL elevated,  BUN 25, creatinine 0.98. -He came with separate handwritten summaries of his labs from 2014 showing PTH elevated between 89-1 67 between May and June 2014, 25 hydroxy vitamin D ranging between 8.6-27 between June and August 2014, phosphorus range from 2.2-2.3 between May and August 2014, 24-hour urine calcium 192 mg in 24 hours in June 2014. DEXA scan showed mild osteopenia in June 2014.   Surveillance DEXA on November 25, 2018  DualFemur Neck Left 11/25/2018 70.4  N/A -1.6 0.868 g/cm2   Left Forearm Radius 33% 11/25/2018 70.4 N/A -0.2 0.787 g/cm2   ASSESSMENT: BMD as determined from Femur Neck Left is 0.868 g/cm2 with a T-Score of -1.6.   This patient is considered OSTEOPENIC according to the [facility]) criteria.   DualFemur Neck Left 12/06/2022 74.4 N/A -2.3 0.777 g/cm2 -9.3% Yes DualFemur Neck Left 11/26/2020 74.4 N/A -1.6 0.857 g/cm2 -1.3% - DualFemur Neck Left 11/25/2018 70.4 N/A -1.6 0.868 g/cm2 - -   DualFemur Total Mean 12/06/2022 74.4 N/A -1.8 0.843 g/cm2 -7.8% Yes DualFemur Total Mean 11/26/2020 72.4 N/A -1.3 0.914 g/cm2 -6.3% Yes DualFemur Total Mean 11/25/2018 70.4 N/A -0.9 0.975 g/cm2 - -   Left Forearm Radius 33% 12/06/2022 74.4 N/A -0.7 0.753 g/cm2 -2.0% - Left Forearm Radius 33% 11/26/2020 72.4 N/A -0.5 0.768 g/cm2 -2.3% - Left Forearm Radius 33% 11/25/2018 70.4 N/A -0.2 0.787 g/cm2 - - ASSESSMENT: BMD as determined from Femur Neck Left is 0.777 g/cm2 with a T-score of -2.3. This patient is considered osteopenic by World Healh Organization (WHO) Criteria.The scan quality is good. Compared with the prior study on 11/26/20, the BMD of the total mean shows a statistically significant decrease. Lumbar spine was excluded due to advanced degenerative changes.  Recent Results (from the past 2160 hour(s))  Lipid panel     Status: None   Collection Time: 10/11/22  8:30 AM  Result Value Ref Range   Cholesterol, Total 123 100 - 199 mg/dL   Triglycerides  56 0 - 149 mg/dL   HDL 57 >39 mg/dL   VLDL Cholesterol Cal 12 5 - 40 mg/dL   LDL Chol Calc (NIH) 54 0 - 99 mg/dL   Chol/HDL Ratio 2.2 0.0 - 5.0 ratio    Comment:                                   T. Chol/HDL Ratio                                             Men  Women                               1/2 Avg.Risk  3.4    3.3                                   Avg.Risk  5.0    4.4                                2X Avg.Risk  9.6    7.1                                3X Avg.Risk 23.4   11.0   Hepatic function panel     Status: None   Collection Time: 10/11/22  8:30 AM  Result Value Ref Range   Total Protein 7.1 6.0 - 8.5 g/dL   Albumin 4.4 3.8 - 4.8 g/dL   Bilirubin Total 0.6 0.0 - 1.2 mg/dL   Bilirubin, Direct 0.19 0.00 - 0.40  mg/dL   Alkaline Phosphatase 102 44 - 121 IU/L   AST 18 0 - 40 IU/L   ALT 11 0 - 44 IU/L  PTH, intact and calcium     Status: None   Collection Time: 10/27/22  8:17 AM  Result Value Ref Range   Calcium 9.4 8.6 - 10.2 mg/dL   PTH 52 15 - 65 pg/mL   PTH Interp Comment     Comment: Interpretation                 Intact PTH    Calcium                                 (pg/mL)      (mg/dL) Normal                          15 - 65     8.6 - 10.2 Primary Hyperparathyroidism         >65          >10.2 Secondary Hyperparathyroidism       >65          <10.2 Non-Parathyroid Hypercalcemia       <65          >10.2 Hypoparathyroidism                  <15          < 8.6 Non-Parathyroid Hypocalcemia    15 - 65          < 8.6   Urinalysis, Routine w reflex microscopic     Status: Abnormal   Collection Time: 11/17/22  8:39 AM  Result Value Ref Range   Specific Gravity, UA 1.015 1.005 - 1.030   pH, UA 6.0 5.0 - 7.5   Color, UA Yellow Yellow   Appearance Ur Clear Clear   Leukocytes,UA Negative Negative   Protein,UA Trace Negative/Trace   Glucose, UA Negative Negative   Ketones, UA Negative Negative   RBC, UA 1+ (A) Negative   Bilirubin, UA Negative Negative   Urobilinogen,  Ur 0.2 0.2 - 1.0 mg/dL   Nitrite, UA Negative Negative   Microscopic Examination See below:     Comment: Microscopic was indicated and was performed.  Microscopic Examination     Status: Abnormal   Collection Time: 11/17/22  8:39 AM   Urine  Result Value Ref Range   WBC, UA 0-5 0 - 5 /hpf   RBC, Urine 3-10 (A) 0 - 2 /hpf   Epithelial Cells (non renal) 0-10 0 - 10 /hpf   Bacteria, UA None seen None seen/Few  PSA     Status: Abnormal   Collection Time: 11/17/22  9:25 AM  Result Value Ref Range   Prostate Specific Ag, Serum 5.1 (H) 0.0 - 4.0 ng/mL    Comment: Roche ECLIA methodology. According to the American Urological Association, Serum PSA should decrease and remain at undetectable levels after radical prostatectomy. The AUA defines biochemical recurrence as an initial PSA value 0.2 ng/mL or greater followed by a subsequent confirmatory PSA value 0.2 ng/mL or greater. Values obtained with different assay methods or kits cannot be used interchangeably. Results cannot be interpreted as absolute evidence of the presence or absence of malignant disease.     Assessment: 1. Hypercalcemia / Hyperparathyroidism 2.  Osteopenia 3.  Hyperlipidemia  Plan: His previsit repeat labs show  improving PTH at 52, associated with calcium 946.  He is advised to lower Sensipar to 30 mg p.o. daily with supper.  His repeat  bone density was consistent with worsening finding, stable consistent with osteopenia of femur.  His lumbar spine was excluded due to degenerative joint diseases. He is due for next bone density before next visit. His prior 24-hour urine calcium was 221.    This is still consistent with primary hyperparathyroidism at a mild form.  He does not have criteria for considering surgery at this time.   -He remains on low-dose vitamin D supplement.  His current vitamin D supplements is with vitamin D3 1000 units daily, advised to continue.     From the point of view of hyperlipidemia on  the background of coronary artery disease, he remains to be a good candidate for lifestyle medicine.  While he maintains his Crestor 5 mg p.o. nightly , he will continue to benefit from lifestyle medicine.   - he acknowledges that there is a room for improvement in his food and drink choices. - Suggestion is made for him to avoid simple carbohydrates  from his diet including Cakes, Sweet Desserts, Ice Cream, Soda (diet and regular), Sweet Tea, Candies, Chips, Cookies, Store Bought Juices, Alcohol , Artificial Sweeteners,  Coffee Creamer, and "Sugar-free" Products, Lemonade. This will help patient to have more stable blood glucose profile and potentially avoid unintended weight gain.  The following Lifestyle Medicine recommendations according to [facility]) were discussed and and offered to patient and he  agrees to start the journey:  A. Whole Foods, Plant-Based Nutrition comprising of fruits and vegetables, plant-based proteins, whole-grain carbohydrates was discussed in detail with the patient.   A list for source of those nutrients were also provided to the patient.  Patient will use only water or unsweetened tea for hydration. B.  The need to stay away from risky substances including alcohol, smoking; obtaining 7 to 9 hours of restorative sleep, at least 150 minutes of moderate intensity exercise weekly, the importance of healthy social connections,  and stress management techniques were discussed. C.  A full color page of  Calorie density of various food groups per pound showing examples of each food groups was provided to the patient.  His repeat labs show improved LDL at 54 dropping from 99.  -He is advised to continue his primary care follow-up with Dr. Posey Pronto.    I spent  26  minutes in the care of the patient today including review of labs from Thyroid Function, CMP, and other relevant labs ; imaging/biopsy records (current and previous including  abstractions from other facilities); face-to-face time discussing  his lab results and symptoms, medications doses, his options of short and long term treatment based on the latest standards of care / guidelines;   and documenting the encounter.  Saunders Glance  participated in the discussions, expressed understanding, and voiced agreement with the above plans.  All questions were answered to his satisfaction. he is encouraged to contact clinic should he have any questions or concerns prior to his return visit.   - Return in about 4 months (around 04/14/2023) for F/U with Pre-visit Labs.   Glade Lloyd, MD [facility] [address] [place], [place] [address] Phone: [phone]  Fax: [phone]    This note was partially dictated with voice recognition software. Similar sounding words can be transcribed inadequately or may not  be corrected  upon review.  12/13/2022, 3:59 PM

## 2022-12-13 NOTE — Therapy (Signed)
OUTPATIENT OCCUPATIONAL THERAPY ORTHO TREATMENT  Patient Name: Billy Hunt MRN: [id number] DOB:1946/02/06, 77 y.o., male Today's Date: 12/13/2022  PCP: Lindell Spar, MD REFERRING PROVIDER: Lindell Spar, MD  END OF SESSION:  OT End of Session - 12/13/22 1510     Visit Number 4    Number of Visits 5    Date for OT Re-Evaluation 12/30/22    Authorization Type Medicare A and B    OT Start Time 74    OT Stop Time 1510    OT Time Calculation (min) 40 min    Activity Tolerance Patient tolerated treatment well    Behavior During Therapy [facility] for tasks assessed/performed                Past Medical History:  Diagnosis Date   Acute coronary syndrome 07/04/2021   Allergy 1976   Anxiety    BPH (benign prostatic hyperplasia)    Gout    Hypercholesterolemia    Hyperparathyroidism    Hypertension    Myocardial infarction 06/2021   Neuromuscular disorder 2021   Osteoporosis 2021   Pulmonary emboli    Vitamin D deficiency    Vitamin D deficiency disease 10/07/2019   Past Surgical History:  Procedure Laterality Date   CORONARY STENT INTERVENTION N/A 07/04/2021   Procedure: CORONARY STENT INTERVENTION;  Surgeon: Nelva Bush, MD;  Location: [place] [facility];  Service: Cardiovascular;  Laterality: N/A;   CORONARY STENT INTERVENTION N/A 07/06/2021   Procedure: CORONARY STENT INTERVENTION;  Surgeon: Troy Sine, MD;  Location: [place] [facility];  Service: Cardiovascular;  Laterality: N/A;   LEFT HEART CATH AND CORONARY ANGIOGRAPHY N/A 07/04/2021   Procedure: LEFT HEART CATH AND CORONARY ANGIOGRAPHY;  Surgeon: Nelva Bush, MD;  Location: [place] [facility];  Service: Cardiovascular;  Laterality: N/A;   PERIPHERAL VASCULAR THROMBECTOMY     TRANSURETHRAL RESECTION OF PROSTATE     VASECTOMY     Patient Active Problem List   Diagnosis Date Noted   Biceps tendonitis on right 11/14/2022   Bruising 05/16/2022   Prediabetes 04/10/2022   Vitamin D  deficiency 04/10/2022   Gross hematuria 12/27/2021   Acute cystitis with hematuria 12/04/2021   Chronic cough 07/21/2021   Coronary artery disease involving native coronary artery of native heart with unstable angina pectoris    NSTEMI (non-ST elevated myocardial infarction) 07/04/2021   GAD (generalized anxiety disorder) 06/01/2021   Idiopathic peripheral neuropathy 06/01/2021   BPH with obstruction/lower urinary tract symptoms 10/07/2019   Hyperparathyroidism, primary 11/20/2018   Osteopenia 11/20/2018   Anterior urethral stricture 06/22/2015   Elevated PSA 06/22/2015   Erectile dysfunction 06/22/2015   History of pulmonary embolus (PE) 04/21/2015   HTN (hypertension) 04/21/2015   Gout 04/21/2015   Mixed hyperlipidemia 04/21/2015    ONSET DATE: December 2023  REFERRING DIAG: Bicep Tendonitis  THERAPY DIAG:  Acute pain of right shoulder  Other symptoms and signs involving the musculoskeletal system  Rationale for Evaluation and Treatment: Rehabilitation  SUBJECTIVE:   SUBJECTIVE STATEMENT: "The pain is probably 2/3rds diminished."   PERTINENT HISTORY: Pt reports that since Oct. 2023 he has noticed some pain in his R arm, by Dec 2023, he reports that the pain has become constant with reaching and moving his arm out from his body. No injections or surgeries have been performed at this time.   PRECAUTIONS: None  WEIGHT BEARING RESTRICTIONS: No  PAIN:  Are you having pain? Yes: NPRS scale: 1/10 Pain location: Posterior deltoid/triceps  Pain description: achy Aggravating factors: movements (abduction) Relieving factors: Tylenol  FALLS: Has patient fallen in last 6 months? No   PATIENT GOALS: To stop the pain   OBJECTIVE:   HAND DOMINANCE: Right  ADLs: Overall ADLs: Pt reports difficulty with IADL's, especially cooking, cleaning, and yard work due to difficulty lifting and managing items.   FUNCTIONAL OUTCOME MEASURES: FOTO: 64.54  UPPER EXTREMITY ROM:      Active ROM Right eval  Shoulder flexion 153  Shoulder abduction 170  Shoulder internal rotation 90  Shoulder external rotation 73  (Blank rows = not tested)  UPPER EXTREMITY MMT:     MMT Right eval  Shoulder flexion 5/5  Shoulder abduction 5/5  Shoulder adduction 5/5  Shoulder extension 5/5  Shoulder internal rotation 5/5  Shoulder external rotation 4/5  Elbow flexion 5/5  Elbow extension 5/5  (Blank rows = not tested)  HAND FUNCTION: Grip strength: Right: 66 lbs; Left: 60 lbs  OBSERVATIONS: Severe fascial restrictions along the biceps, axillary region, scapularis.    TODAY'S TREATMENT:                                                                                                                              DATE:  12/13/22 -Shoulder strengthening: supine, 2#- protraction, flexion, horizontal abduction, er/IR, abduction, 10 reps -Proximal shoulder strengthening: supine, 2#- paddles, criss cross, circles each direction, 10 reps each -Theraband strengthening: sidelying, green band-protraction, flexion, horizontal abduction, abduction, er, 10 reps each -Therapy ball strengthening: standing-chest press, flexion, overhead press, circles each direction, 10 reps each -Shoulder strengthening: standing- 2#- protraction, flexion, horizontal abduction, er/IR, abduction, 10 reps -X to V arms: standing,  2#, 10 reps -Ball on wall: 1' flexion, 1' abduction -Scapular Theraband: row, extension, retraction, 10 reps -Overhead lacing: seated, lacing from top down then reversing, wearing 2# wrist weight -UBE: level 3, 2' forward, 2' reverse, pace: 8.5  12/07/22 -Myofascial release: myofascial release to right upper arm and trapezius regions to decrease pain and fascial restrictions and increase joint ROM -P/ROM: supine-flexion, abduction, horizontal abduction, 5 reps each -Shoulder strengthening: supine, 2#- protraction, flexion, horizontal abduction, er/IR, abduction, 10 reps -Proximal  shoulder strengthening: supine, 2#- paddles, criss cross, circles each direction, 10 reps each -Theraband strengthening: sidelying, green band-protraction, flexion, horizontal abduction, abduction, er, 10 reps each -ABC writing: holding 2# weight at 90 degrees flexion, pt writing ABCs in air -X to V arms: standing, 10 reps -Overhead lacing: seated, lacing from top down then reversing, wearing 1# wrist weight -Ball on wall: 1' flexion, 1' abduction -UBE: level 2, 2' forward, 2' reverse, pace: 9.0  11/29/22 -Myofascial release: myofascial release to right upper arm and trapezius regions to decrease pain and fascial restrictions and increase joint ROM -Shoulder strengthening: protraction, flexion, horizontal abduction, er/IR, abduction, 10 reps -Proximal shoulder strengthening: paddles, criss cross, circles each direction, 10 reps each -Ball on wall: 1' flexion, 1' abduction -Therapy ball strengthening: chest press, flexion, circles each direction,  overhead press, 10 reps each  -Theraband strengthening: green band-protraction, flexion, horizontal abduction, abduction, er, 10 reps each -Scapular theraband: green-row, extension, retraction, 10 reps each   PATIENT EDUCATION: Education details: reviewed HEP Person educated: Patient Education method: Customer service manager Education comprehension: verbalized understanding and returned demonstration  HOME EXERCISE PROGRAM: 3/14: Wall Slides 3/20: Theraband strengthening-green  GOALS: Goals reviewed with patient? Yes  SHORT TERM GOALS: Target date: 12/30/22  Pt will be provided with and educated on HEP to improve mobility in RUE required for use during ADL completion.   Goal status: IN PROGRESS  2. Pt will decrease pain in RUE to 3/10 or less to improve ability to sleep for 2+ consecutive hours without waking due to pain.   Goal status: IN PROGRESS   3.  Pt will decrease RUE fascial restrictions to min amounts or less to improve  mobility required for functional reaching tasks.   Goal status: IN PROGRESS  4.  Pt will increase RUE strength to 5/5 to improve ability to lift and carry items during meal preparation, housework, and yardwork tasks.  Goal status: IN PROGRESS  5.  Pt will return to highest level of function using RUE as dominant during functional task completion.   Goal status: IN PROGRESS   ASSESSMENT:  CLINICAL IMPRESSION: Patient reports continued improvement in pain, HEP is going well. Continued with shoulder strengthening pt with full ROM and trace fascial restrictions, therefore no manual techniques or passive stretching required. Added strengthening in standing with therapy ball and with weights. Continued with ball on wall with improvement in form. Added weight to x to v arms and increased wrist weight for overhead lacing. Verbal cuing for form and technique, intermittent visual demonstration.   PERFORMANCE DEFICITS: in functional skills including ADLs, IADLs, ROM, strength, pain, fascial restrictions, Gross motor control, body mechanics, and UE functional use.   PLAN:  OT FREQUENCY: 1x/week  OT DURATION: 4 weeks  PLANNED INTERVENTIONS: self care/ADL training, therapeutic exercise, therapeutic activity, manual therapy, passive range of motion, functional mobility training, electrical stimulation, ultrasound, moist heat, cryotherapy, patient/family education, and DME and/or AE instructions   CONSULTED AND AGREED WITH PLAN OF CARE: Patient  PLAN FOR NEXT SESSION: Follow up on HEP, RUE strengthening, [place], OTR/L  [phone] 12/13/2022, 3:11 PM

## 2022-12-13 NOTE — Patient Instructions (Signed)

## 2022-12-21 ENCOUNTER — Ambulatory Visit (HOSPITAL_COMMUNITY): Payer: Medicare Other | Admitting: Occupational Therapy

## 2022-12-21 DIAGNOSIS — M25511 Pain in right shoulder: Secondary | ICD-10-CM

## 2022-12-21 DIAGNOSIS — R29898 Other symptoms and signs involving the musculoskeletal system: Secondary | ICD-10-CM | POA: Diagnosis not present

## 2022-12-21 NOTE — Therapy (Signed)
OUTPATIENT OCCUPATIONAL THERAPY ORTHO TREATMENT DISCHARGE NOTE  Patient Name: Billy Hunt MRN: [id number] DOB:October 30, 1945, 77 y.o., male Today's Date: 12/21/2022  PCP: Anabel Halon, MD REFERRING PROVIDER: Anabel Halon, MD  OCCUPATIONAL THERAPY DISCHARGE SUMMARY  Visits from Start of Care: 5  Current functional level related to goals / functional outcomes: Patient has met 6 out of 6 goals. He has been provided a comprehensive HEP, his pain is minimal, no noted fascial restrictions, and he is able to use his RUE as his dominant in all ADL's and IADL's again. Additionally, his ROM is [facility] and strength is 5/5.    Remaining deficits: Pt presents with no remaining deficits at this time.    Education / Equipment: Pt was provided a comprehensive HEP and Theraband's for continued progression with strengthening   Plan: Patient agrees to discharge.  Patient goals were not met. Patient is being discharged due to meeting the stated rehab goals.       END OF SESSION:  OT End of Session - 12/21/22 1349     Visit Number 5    Number of Visits 5    Date for OT Re-Evaluation 12/30/22    Authorization Type Medicare A and B    OT Start Time 1349    OT Stop Time 1430    OT Time Calculation (min) 41 min    Activity Tolerance Patient tolerated treatment well    Behavior During Therapy [facility] for tasks assessed/performed                Past Medical History:  Diagnosis Date   Acute coronary syndrome 07/04/2021   Allergy 1976   Anxiety    BPH (benign prostatic hyperplasia)    Gout    Hypercholesterolemia    Hyperparathyroidism    Hypertension    Myocardial infarction 06/2021   Neuromuscular disorder 2021   Osteoporosis 2021   Pulmonary emboli    Vitamin D deficiency    Vitamin D deficiency disease 10/07/2019   Past Surgical History:  Procedure Laterality Date   CORONARY STENT INTERVENTION N/A 07/04/2021   Procedure: CORONARY STENT INTERVENTION;  Surgeon: Yvonne Kendall, MD;  [facility]: [facility];  Service: Cardiovascular;  Laterality: N/A;   CORONARY STENT INTERVENTION N/A 07/06/2021   Procedure: CORONARY STENT INTERVENTION;  Surgeon: Lennette Bihari, MD;  Location: [facility];  Service: Cardiovascular;  Laterality: N/A;   LEFT HEART CATH AND CORONARY ANGIOGRAPHY N/A 07/04/2021   Procedure: LEFT HEART CATH AND CORONARY ANGIOGRAPHY;  Surgeon: Yvonne Kendall, MD;  Location: [facility];  Service: Cardiovascular;  Laterality: N/A;   PERIPHERAL VASCULAR THROMBECTOMY     TRANSURETHRAL RESECTION OF PROSTATE     VASECTOMY     Patient Active Problem List   Diagnosis Date Noted   Biceps tendonitis on right 11/14/2022   Bruising 05/16/2022   Prediabetes 04/10/2022   Vitamin D deficiency 04/10/2022   Gross hematuria 12/27/2021   Acute cystitis with hematuria 12/04/2021   Chronic cough 07/21/2021   Coronary artery disease involving native coronary artery of native heart with unstable angina pectoris    NSTEMI (non-ST elevated myocardial infarction) 07/04/2021   GAD (generalized anxiety disorder) 06/01/2021   Idiopathic peripheral neuropathy 06/01/2021   BPH with obstruction/lower urinary tract symptoms 10/07/2019   Hyperparathyroidism, primary 11/20/2018   Osteopenia 11/20/2018   Anterior urethral stricture 06/22/2015   Elevated PSA 06/22/2015   Erectile dysfunction 06/22/2015   History of pulmonary embolus (PE) 04/21/2015  HTN (hypertension) 04/21/2015   Gout 04/21/2015   Mixed hyperlipidemia 04/21/2015    ONSET DATE: December 2023  REFERRING DIAG: Bicep Tendonitis  THERAPY DIAG:  Acute pain of right shoulder  Other symptoms and signs involving the musculoskeletal system  Rationale for Evaluation and Treatment: Rehabilitation  SUBJECTIVE:   SUBJECTIVE STATEMENT: "I've really been feeling good."   PERTINENT HISTORY: Pt reports that since Oct. 2023 he has noticed some pain in his R arm, by Dec 2023, he  reports that the pain has become constant with reaching and moving his arm out from his body. No injections or surgeries have been performed at this time.   PRECAUTIONS: None  WEIGHT BEARING RESTRICTIONS: No  PAIN:  Are you having pain? Yes: NPRS scale: 1/10 Pain location: Posterior deltoid/triceps Pain description: achy Aggravating factors: movements (abduction) Relieving factors: Tylenol  FALLS: Has patient fallen in last 6 months? No   PATIENT GOALS: To stop the pain   OBJECTIVE:   HAND DOMINANCE: Right  ADLs: Overall ADLs: Pt reports difficulty with IADL's, especially cooking, cleaning, and yard work due to difficulty lifting and managing items.   FUNCTIONAL OUTCOME MEASURES: FOTO: 64.54 12/21/22: 97.75  UPPER EXTREMITY ROM:     Active ROM Right eval Right 12/21/22  Shoulder flexion 153 155  Shoulder abduction 170 170  Shoulder internal rotation 90 90  Shoulder external rotation 73 70  (Blank rows = not tested)  UPPER EXTREMITY MMT:     MMT Right eval Right  12/21/22  Shoulder flexion 5/5 5/5  Shoulder abduction 5/5 5/5  Shoulder adduction 5/5 5/5  Shoulder extension 5/5 5/5  Shoulder internal rotation 5/5 5/5  Shoulder external rotation 4/5 5/5  Elbow flexion 5/5 5/5  Elbow extension 5/5 5/5  (Blank rows = not tested)  HAND FUNCTION: Grip strength: Right: 66 lbs; Left: 60 lbs  OBSERVATIONS: Severe fascial restrictions along the biceps, axillary region, scapularis.    TODAY'S TREATMENT:                                                                                                                              DATE:  12/21/22 -A/ROM: flexion, abduction, protraction, horizontal abduction, er/IR, x15 -Crazy eights: around the head then around the body with yellow weighted ball, x10 -Therapy ball exercises: chest press, flexion, overhead press, V ups, circles both directions, x10 -Measurements for Reassessment  12/13/22 -Shoulder strengthening: supine,  2#- protraction, flexion, horizontal abduction, er/IR, abduction, 10 reps -Proximal shoulder strengthening: supine, 2#- paddles, criss cross, circles each direction, 10 reps each -Theraband strengthening: sidelying, green band-protraction, flexion, horizontal abduction, abduction, er, 10 reps each -Therapy ball strengthening: standing-chest press, flexion, overhead press, circles each direction, 10 reps each -Shoulder strengthening: standing- 2#- protraction, flexion, horizontal abduction, er/IR, abduction, 10 reps -X to V arms: standing,  2#, 10 reps -Ball on wall: 1' flexion, 1' abduction -Scapular Theraband: row, extension, retraction, 10 reps -Overhead lacing: seated, lacing from top down then reversing,  wearing 2# wrist weight -UBE: level 3, 2' forward, 2' reverse, pace: 8.5  12/07/22 -Myofascial release: myofascial release to right upper arm and trapezius regions to decrease pain and fascial restrictions and increase joint ROM -P/ROM: supine-flexion, abduction, horizontal abduction, 5 reps each -Shoulder strengthening: supine, 2#- protraction, flexion, horizontal abduction, er/IR, abduction, 10 reps -Proximal shoulder strengthening: supine, 2#- paddles, criss cross, circles each direction, 10 reps each -Theraband strengthening: sidelying, green band-protraction, flexion, horizontal abduction, abduction, er, 10 reps each -ABC writing: holding 2# weight at 90 degrees flexion, pt writing ABCs in air -X to V arms: standing, 10 reps -Overhead lacing: seated, lacing from top down then reversing, wearing 1# wrist weight -Ball on wall: 1' flexion, 1' abduction -UBE: level 2, 2' forward, 2' reverse, pace: 9.0  PATIENT EDUCATION: Education details: Therapy ball Exercises Person educated: Patient Education method: Explanation and Demonstration Education comprehension: verbalized understanding and returned demonstration  HOME EXERCISE PROGRAM: 3/14: Wall Slides 3/20: Theraband  strengthening-green 4/11: therapy ball Exercises  GOALS: Goals reviewed with patient? Yes  SHORT TERM GOALS: Target date: 12/30/22  Pt will be provided with and educated on HEP to improve mobility in RUE required for use during ADL completion.   Goal status: MET  2. Pt will decrease pain in RUE to 3/10 or less to improve ability to sleep for 2+ consecutive hours without waking due to pain.   Goal status: MET   3.  Pt will decrease RUE fascial restrictions to min amounts or less to improve mobility required for functional reaching tasks.   Goal status: MET  4.  Pt will increase RUE strength to 5/5 to improve ability to lift and carry items during meal preparation, housework, and yardwork tasks.  Goal status: MET  5.  Pt will return to highest level of function using RUE as dominant during functional task completion.   Goal status: MET   ASSESSMENT:  CLINICAL IMPRESSION: This session pt completed his reassessment for discharge. OT completed his comprehensive HEP and he is able to manage all exercises and tasks to maintain mobility and strength. His ROM and strength are [facility] and per his report at this time he has no further concerns. OT reviewed his HEP and ensured good movement pattern, positioning, and technique. Pt has no further skilled OT concerns at this time and will be discharged from OP OT.   PERFORMANCE DEFICITS: in functional skills including ADLs, IADLs, ROM, strength, pain, fascial restrictions, Gross motor control, body mechanics, and UE functional use.   PLAN:  OT FREQUENCY: 1x/week  OT DURATION: 4 weeks  PLANNED INTERVENTIONS: self care/ADL training, therapeutic exercise, therapeutic activity, manual therapy, passive range of motion, functional mobility training, electrical stimulation, ultrasound, moist heat, cryotherapy, patient/family education, and DME and/or AE instructions   CONSULTED AND AGREED WITH PLAN OF CARE: Patient  PLAN FOR NEXT SESSION:  Discharge.    Trish Mage, OTR/L [phone] 12/21/2022, 1:50 PM

## 2022-12-21 NOTE — Patient Instructions (Signed)
Therapy Ball Strengthening Exercises: Complete all exercises 10-15X each, 2x/day.   1) Overhead press: Hold a medicine ball or other ball at your chest. Next, extend your elbows and push the ball over your head as shown. Return to starting position and repeat.     2) Chest press: Hold a medicine ball or other ball at your chest. Next, extend your elbows and push the ball outward in front of your body as shown. Return to starting position and repeat.     3) Ball circles:  Hold a medicine ball or other ball at your chest. Next, extend your elbows and push the ball outward in front of your body as shown. Then, move the ball in a circular pattern for several revolutions and then reverse the direction.     4) Flexion: Start holding an exercise ball out in front of your body with elbows extended. Then, slowly lift the ball up over head. Return the ball to starting position and repeat.     5) Ball diagonals: Begin holding a ball with both hands and then move it through a diagonal pattern from your hip to over your opposite shoulder as shown.    

## 2023-02-25 NOTE — Progress Notes (Deleted)
Cardiology Office Note   Date:  02/25/2023   ID:  Billy Hunt, DOB 09-04-1946, MRN [id number]  PCP:  Anabel Halon, MD  Cardiologist:   Dietrich Pates, MD   F?U of CAD      History of Present Illness: Billy Hunt is a 77 y.o. male with a history of CAD (NSTEMI in 06/2021), Cath showed multivessel dz.   Pt underwent PCI/DES to an occluded OM1 and then PCI/DES to LAD (staged intervention)    Pt also with a hx of HTN, hyperparthyoidism, BPH, DVT/PE, HL, gout, neuropathy, COPD    I saw the pt in clnic in March 2023   At that time he said he held his carvedilol and losartan and was feeling better   His BP was high in clinic so I recomm he bring cuff I saw the pt in clinci in May 2023     PT goes to gym about 3x per week   Works in yard  No CP  No SOB   Rare dizziess     I saw the pt in clinc in Dec 2023     Allergies:   Sulfa antibiotics and Sulfamethoxazole   Past Medical History:  Diagnosis Date   Acute coronary syndrome (HCC) 07/04/2021   Allergy 1976   Anxiety    BPH (benign prostatic hyperplasia)    Gout    Hypercholesterolemia    Hyperparathyroidism (HCC)    Hypertension    Myocardial infarction (HCC) 06/2021   Neuromuscular disorder (HCC) 2021   Osteoporosis 2021   Pulmonary emboli (HCC)    Vitamin D deficiency    Vitamin D deficiency disease 10/07/2019    Past Surgical History:  Procedure Laterality Date   CORONARY STENT INTERVENTION N/A 07/04/2021   Procedure: CORONARY STENT INTERVENTION;  Surgeon: Yvonne Kendall, MD;  [facility];  Service: Cardiovascular;  Laterality: N/A;   CORONARY STENT INTERVENTION N/A 07/06/2021   Procedure: CORONARY STENT INTERVENTION;  Surgeon: Lennette Bihari, MD;  [facility]: [facility];  Service: Cardiovascular;  Laterality: N/A;   LEFT HEART CATH AND CORONARY ANGIOGRAPHY N/A 07/04/2021   Procedure: LEFT HEART CATH AND CORONARY ANGIOGRAPHY;  Surgeon: Yvonne Kendall, MD;  [facility]: [facility];  Service: Cardiovascular;  Laterality: N/A;   PERIPHERAL VASCULAR THROMBECTOMY     TRANSURETHRAL RESECTION OF PROSTATE     VASECTOMY       Social History:  The patient  reports that he quit smoking about 51 years ago. His smoking use included cigarettes. He has a 10.00 pack-year smoking history. He has never used smokeless tobacco. He reports current drug use. Drug: Marijuana. He reports that he does not drink alcohol.   Family History:  The patient's family history includes Alcohol abuse in his paternal grandfather; Alzheimer's disease in his mother; Breast cancer in his mother; Cancer in his father and paternal grandmother; Deep vein thrombosis in his brother; Heart disease in his brother; Hypertension in his father; Stroke in his mother.    ROS:  Please see the history of present illness. All other systems are reviewed and  Negative to the above problem except as noted.    PHYSICAL EXAM: VS:  There were no vitals taken for this visit.  GEN: Well nourished, well developed, in no acute distress  HEENT: normal  Neck: no JVD Cardiac: RRR; no murmurs,  No LE edema  Respiratory:  clear to auscultation bilaterally, GI: soft, nontender, nondistended, + BS  No  hepatomegaly  MS: no deformity Moving all extremities   Skin: warm and dry, no rash Neuro:  Strength and sensation are intact Psych: euthymic mood, full affect   EKG:  EKG is  ordered today.Sinus bradycardia   54 bpm  CATH 06/2021     Ost LM to Mid LM lesion is 20% stenosed.   Prox LAD lesion is 30% stenosed.   Mid LAD-2 lesion is 70% stenosed.   Prox Cx to Mid Cx lesion is 40% stenosed.   1st Diag-1 lesion is 30% stenosed.   1st Diag-2 lesion is 70% stenosed.   2nd Diag lesion is 80% stenosed.   1st Mrg lesion is 70% stenosed.   Mid LAD-1 lesion is 90% stenosed.   Mid LAD-3 lesion is 60% stenosed.   Non-stenotic 2nd Mrg lesion was previously treated.   A drug-eluting stent was successfully placed.   Post  intervention, there is a 0% residual stenosis.   Post intervention, there is a 0% residual stenosis.   Post intervention, there is a 0% residual stenosis.   Successful staged PCI to the LAD in this patient who is 2 days status post initial presentation with ACS and total occlusion of the circumflex marginal vessel which was stented.   The LAD had diffuse disease of 90, 70 % and 60% between the 1st and second diagonal vessel which was successfully stented with a 2.5 x 38 mm Onyx frontier DES stent postdilated to 2.75 mm with the stenosis being reduced to 0%.   RECOMMENDATION: DAPT for minimum of 12 months in this patient status post ACS 2 days previously with successful stenting of the circumflex marginal vessel, and is successful staged PCI to diffusely diseased mid LAD today.  Medical therapy for concomitant RCA disease.  Aggressive lipid-lowering therapy with target LDL less than 70 and optimal blood pressure control.  Diagnostic Dominance: Right Intervention         PCI 07/04/2021 Dr. Okey Dupre CORONARY STENT INTERVENTION  LEFT HEART CATH AND CORONARY ANGIOGRAPHY    Conclusion   Conclusions: Significant multivessel coronary artery disease, as detailed below.  Culprit lesion for the patient's NSTEMI is likely occluded ostial/proximal OM2 branch.  There is also severe mid LAD and D1 disease. Low normal left ventricular filling pressure (LVEDP ~5 mmHg). Successful PCI to OM2 using Onyx Frontier 2.5 x 26 mm drug-eluting stent with 0% residual stenosis and TIMI-3 flow.   Recommendations: Continue cangrelor infusion for 2 hours following ticagrelor load. Aggressive secondary prevention. Wean IV nitroglycerin as tolerated. Consider staged PCI to mid LAD; timing (during this admission versus as an outpatient in the next few weeks) to be determined based on symptoms.     Diagnostic Dominance: Right Intervention        Echocardiogram 07/04/2021 1. Left ventricular ejection fraction, by  estimation, is 60 to 65%. The  left ventricle has normal function. The left ventricle has no regional  wall motion abnormalities. The left ventricular internal cavity size was  mildly dilated. There is mild left  ventricular hypertrophy. Left ventricular diastolic parameters are  consistent with Grade I diastolic dysfunction (impaired relaxation).   2. Right ventricular systolic function is normal. The right ventricular  size is normal. There is mildly elevated pulmonary artery systolic  pressure.   3. Left atrial size was mildly dilated.   4. The mitral valve is normal in structure. Mild mitral valve  regurgitation.   5. The aortic valve is tricuspid. Aortic valve regurgitation is mild.  Mild aortic valve sclerosis  is present, with no evidence of aortic valve  stenosis.   Comparison(s): The left ventricular function is unchanged.          Lipid Panel    Component Value Date/Time   CHOL 123 10/11/2022 0830   TRIG 56 10/11/2022 0830   HDL 57 10/11/2022 0830   CHOLHDL 2.2 10/11/2022 0830   CHOLHDL 4.4 07/05/2021 0320   VLDL 9 07/05/2021 0320   LDLCALC 54 10/11/2022 0830      Wt Readings from Last 3 Encounters:  12/13/22 160 lb 3.2 oz (72.7 kg)  11/14/22 159 lb 9.6 oz (72.4 kg)  10/16/22 162 lb 9.6 oz (73.8 kg)      ASSESSMENT AND PLAN:   CAD    Pt s/p NSTEMI in October 2022   s/p PTCA/Stent to an occluded OM and then staged procedure for PTCA/DES to LAD        The pt is doing well  Denies CP  Very active      Currently on ASA/Plavix   Will review switching to monotherapy    2   HTN    BP readings a little high this morning   Pt will check at home   Write back if 130s/140s /90s     May need to intensify therapy       3   HL  Pt reports he has been approved for incliseran   Will review with research area  Follow up in 6 months    Current medicines are reviewed at length with the patient today.  The patient does not have concerns regarding medicines.  Signed, Dietrich Pates, MD  02/25/2023 1:40 PM    [facility] [address] [place], [place], [place]  [address] Phone: [phone]; Fax: [phone]

## 2023-02-26 ENCOUNTER — Ambulatory Visit: Payer: Medicare Other | Admitting: Internal Medicine

## 2023-03-12 ENCOUNTER — Other Ambulatory Visit: Payer: Self-pay | Admitting: "Endocrinology

## 2023-03-24 IMAGING — DX DG CHEST 2V
2 series · 2 of 2 positions shown · non-contrast
Comparison: 07/04/2021

CLINICAL DATA: Short of breath

EXAM:
CHEST - 2 VIEW

[chest pa]
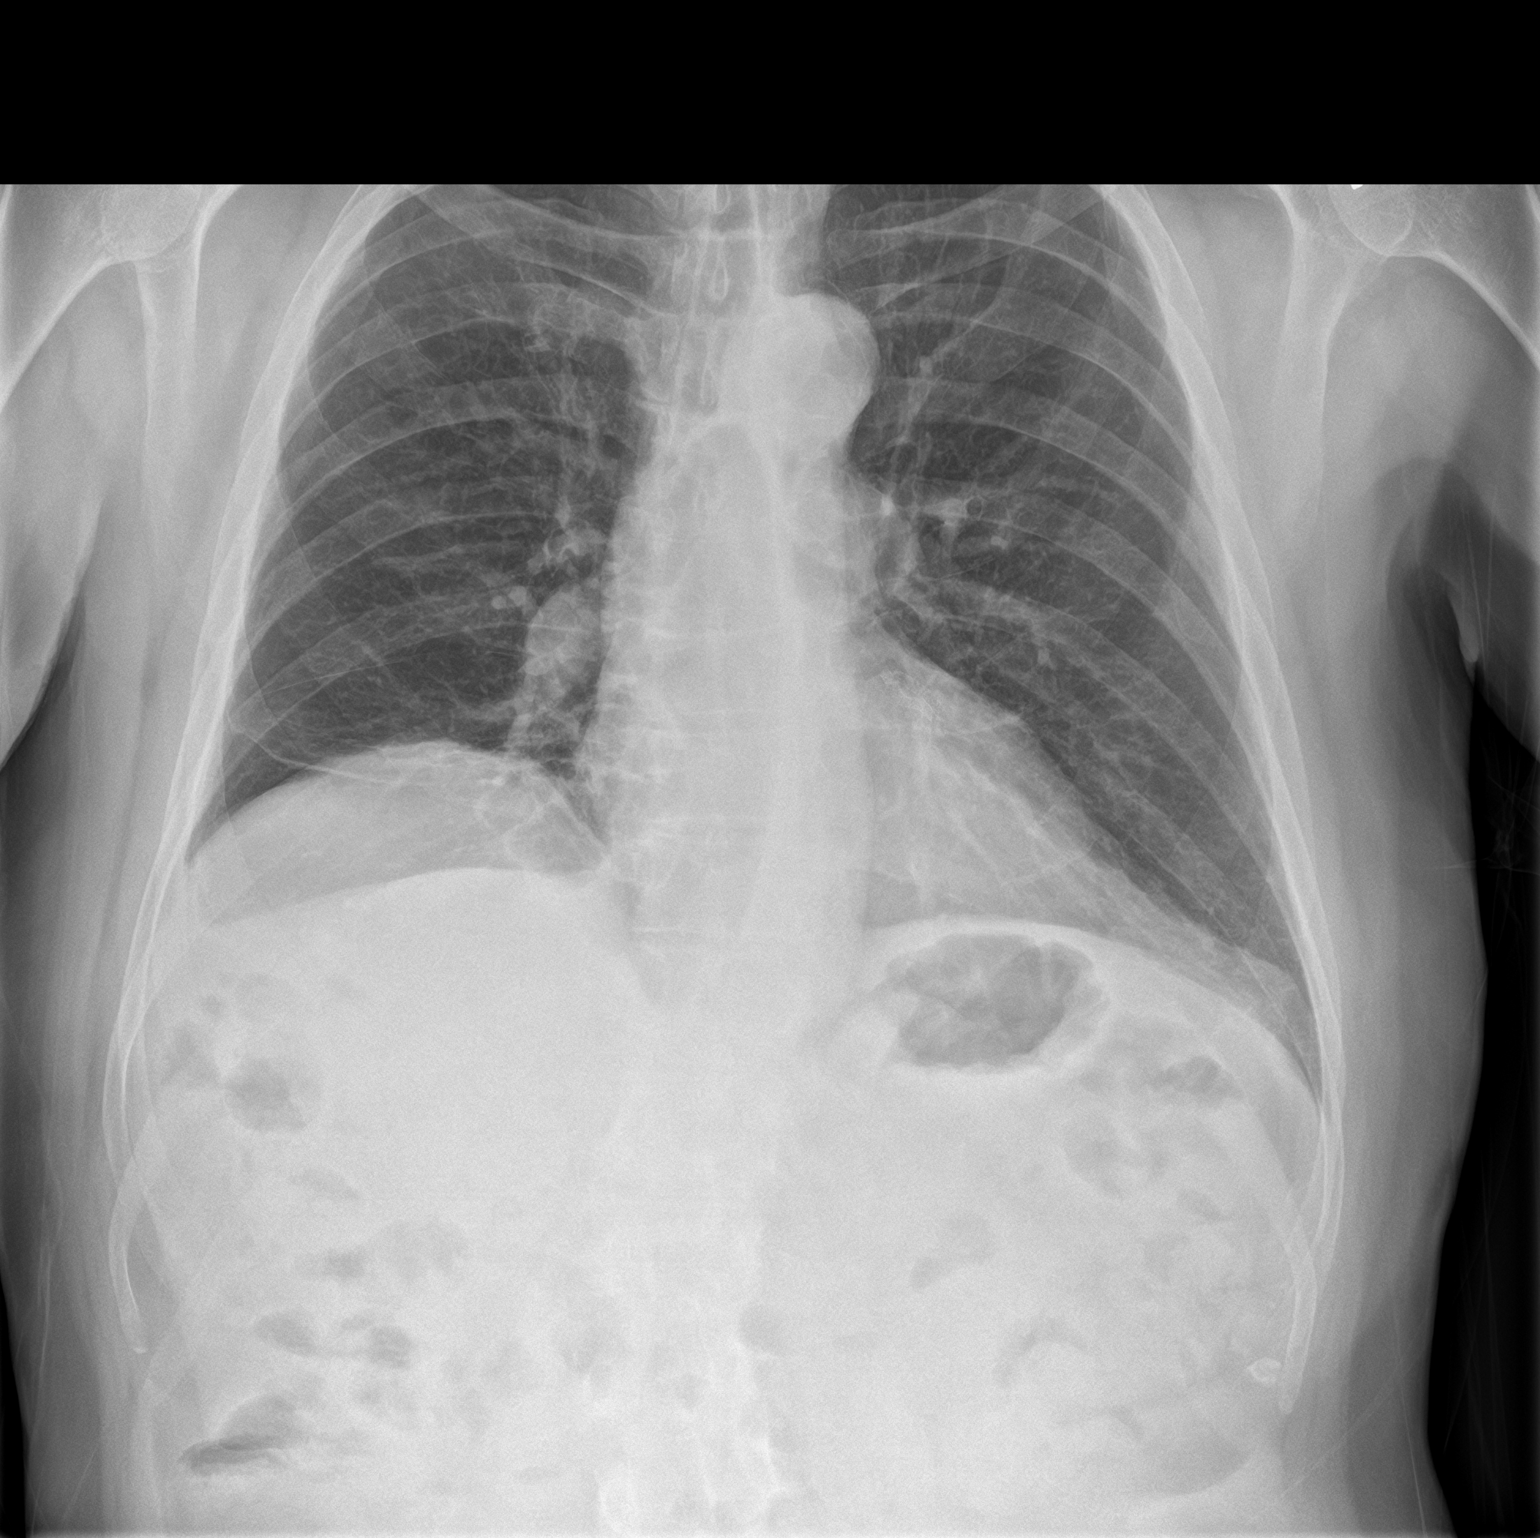

[chest lat]
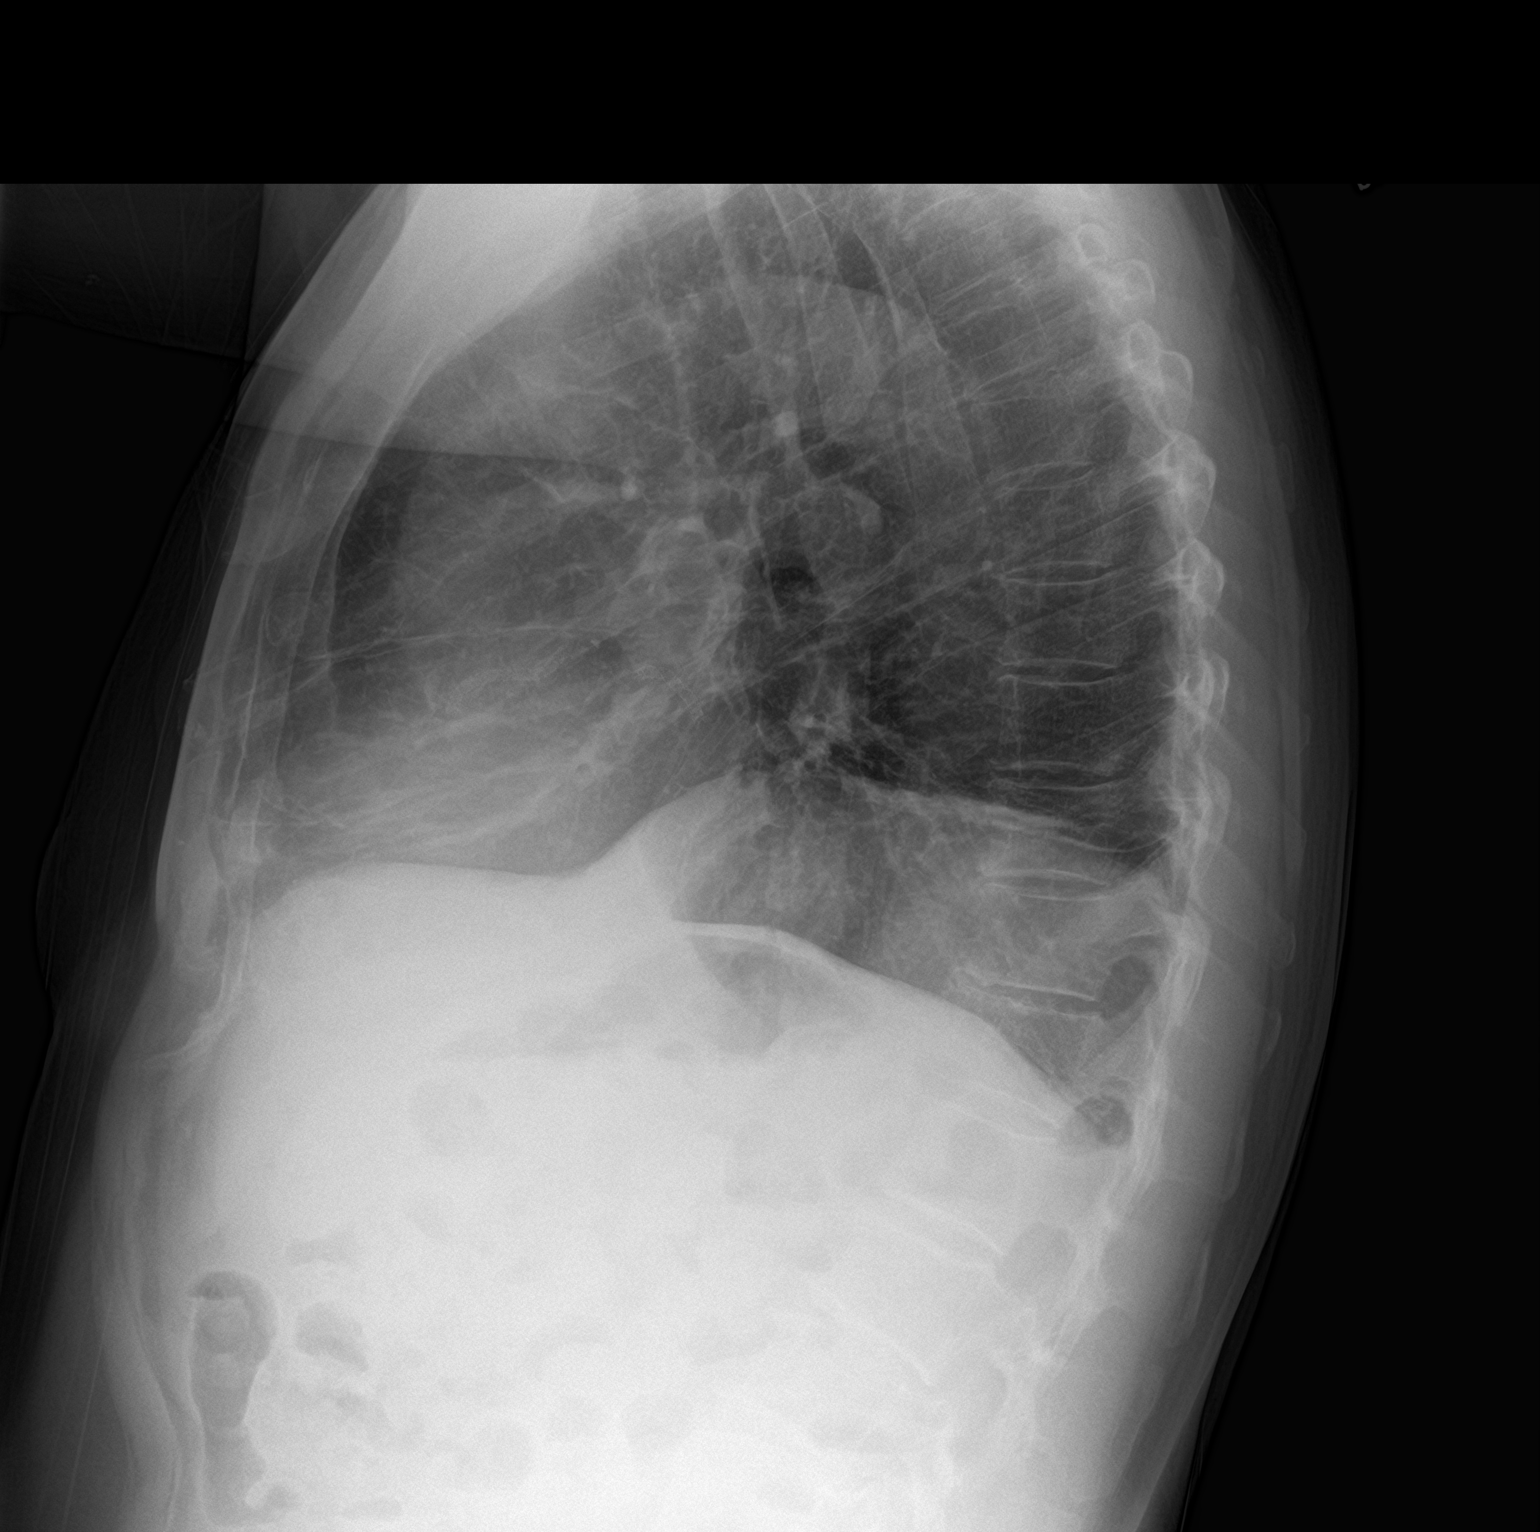

[2 of 2 positions shown; findings below may reference images not displayed]

FINDINGS: Normal mediastinum and cardiac silhouette. Normal pulmonary
vasculature. No evidence of effusion, infiltrate, or pneumothorax.
No acute bony abnormality. Chronic elevation RIGHT hemidiaphragm.
IMPRESSION: No acute cardiopulmonary process.

## 2023-04-05 DIAGNOSIS — E210 Primary hyperparathyroidism: Secondary | ICD-10-CM | POA: Diagnosis not present

## 2023-04-05 DIAGNOSIS — E559 Vitamin D deficiency, unspecified: Secondary | ICD-10-CM | POA: Diagnosis not present

## 2023-04-09 NOTE — Progress Notes (Unsigned)
Cardiology Office Note   Date:  04/12/2023   ID:  Billy Hunt 01-01-1946, MRN [id number]  PCP:  Anabel Halon, MD  Cardiologist:   Dietrich Pates, MD   F/U of CAD      History of Present Illness: Billy Hunt is a 77 y.o. male with a history of CAD (NSTEMI in 06/2021), Cath showed multivessel dz.   Pt underwent PCI/DES to an occluded OM1 and then PCI/DES to LAD (staged intervention)    Pt also with a hx of HTN, hyperparthyoidism, BPH, DVT/PE, HL, gout, neuropathy, COPD    I saw the pt in Dec 2023   Since seen the pt says he has done well  Breathing is good  No CP   no dizziness     Current Meds  Medication Sig   albuterol (VENTOLIN HFA) 108 (90 Base) MCG/ACT inhaler Inhale 2 puffs into the lungs every 6 (six) hours as needed for wheezing or shortness of breath.   cholecalciferol (VITAMIN D3) 25 MCG (1000 UT) tablet Take 1,000 Units by mouth daily.   cinacalcet (SENSIPAR) 30 MG tablet Take 1 tablet (30 mg total) by mouth daily with supper.   clopidogrel (PLAVIX) 75 MG tablet Take one (1) tablet by mouth (75 mg) daily.   inclisiran (LEQVIO) 284 MG/1.5ML SOSY injection Inject 1.5 mLs (284 mg total) into the skin. Every 6 months   nitroGLYCERIN (NITROSTAT) 0.4 MG SL tablet Place 1 tablet (0.4 mg total) under the tongue every 5 (five) minutes x 3 doses as needed for chest pain.   rosuvastatin (CRESTOR) 5 MG tablet Take 1 tablet (5 mg total) by mouth daily.   tamsulosin (FLOMAX) 0.4 MG CAPS capsule Take 1 capsule (0.4 mg total) by mouth 2 (two) times daily.   venlafaxine XR (EFFEXOR-XR) 150 MG 24 hr capsule Take 1 capsule (150 mg total) by mouth daily.     Allergies:   Sulfa antibiotics and Sulfamethoxazole   Past Medical History:  Diagnosis Date   Acute coronary syndrome (HCC) 07/04/2021   Allergy 1976   Anxiety    BPH (benign prostatic hyperplasia)    Gout    Hypercholesterolemia    Hyperparathyroidism (HCC)    Hypertension    Myocardial infarction (HCC) 06/2021    Neuromuscular disorder (HCC) 2021   Osteoporosis 2021   Pulmonary emboli (HCC)    Vitamin D deficiency    Vitamin D deficiency disease 10/07/2019    Past Surgical History:  Procedure Laterality Date   CORONARY STENT INTERVENTION N/A 07/04/2021   Procedure: CORONARY STENT INTERVENTION;  Surgeon: Yvonne Kendall, MD;  [facility];  Service: Cardiovascular;  Laterality: N/A;   CORONARY STENT INTERVENTION N/A 07/06/2021   Procedure: CORONARY STENT INTERVENTION;  Surgeon: Lennette Bihari, MD;  [facility]: [facility];  Service: Cardiovascular;  Laterality: N/A;   LEFT HEART CATH AND CORONARY ANGIOGRAPHY N/A 07/04/2021   Procedure: LEFT HEART CATH AND CORONARY ANGIOGRAPHY;  Surgeon: Yvonne Kendall, MD;  [facility]: [facility];  Service: Cardiovascular;  Laterality: N/A;   PERIPHERAL VASCULAR THROMBECTOMY     TRANSURETHRAL RESECTION OF PROSTATE     VASECTOMY       Social History:  The patient  reports that he quit smoking about 52 years ago. His smoking use included cigarettes. He started smoking about 62 years ago. He has a 10 pack-year smoking history. He has never used smokeless tobacco. He reports current drug use. Drug: Marijuana. He reports that he  does not drink alcohol.   Family History:  The patient's family history includes Alcohol abuse in his paternal grandfather; Alzheimer's disease in his mother; Breast cancer in his mother; Cancer in his father and paternal grandmother; Deep vein thrombosis in his brother; Heart disease in his brother; Hypertension in his father; Stroke in his mother.    ROS:  Please see the history of present illness. All other systems are reviewed and  Negative to the above problem except as noted.    PHYSICAL EXAM: VS:  BP (!) 150/78 (BP Location: Right Arm, Cuff Size: Normal)   Pulse 62   Ht 5\' 9"  (1.753 m)   Wt 161 lb 6.4 oz (73.2 kg)   SpO2 97%   BMI 23.83 kg/m    BP on my check  R arm 150/80   L arm 135/80     GEN: Well nourished, well developed, in no acute distress  HEENT: normal  Neck: no JVD or bruits  Cardiac: RRR; no murmur,  No LE edema  Respiratory:  clear to auscultation  GI: soft, nontender  No hepatomegaly  MS: no deformity Moving all extremities     EKG:  EKG is not ordered today  CATH 06/2021     Ost LM to Mid LM lesion is 20% stenosed.   Prox LAD lesion is 30% stenosed.   Mid LAD-2 lesion is 70% stenosed.   Prox Cx to Mid Cx lesion is 40% stenosed.   1st Diag-1 lesion is 30% stenosed.   1st Diag-2 lesion is 70% stenosed.   2nd Diag lesion is 80% stenosed.   1st Mrg lesion is 70% stenosed.   Mid LAD-1 lesion is 90% stenosed.   Mid LAD-3 lesion is 60% stenosed.   Non-stenotic 2nd Mrg lesion was previously treated.   A drug-eluting stent was successfully placed.   Post intervention, there is a 0% residual stenosis.   Post intervention, there is a 0% residual stenosis.   Post intervention, there is a 0% residual stenosis.   Successful staged PCI to the LAD in this patient who is 2 days status post initial presentation with ACS and total occlusion of the circumflex marginal vessel which was stented.   The LAD had diffuse disease of 90, 70 % and 60% between the 1st and second diagonal vessel which was successfully stented with a 2.5 x 38 mm Onyx frontier DES stent postdilated to 2.75 mm with the stenosis being reduced to 0%.   RECOMMENDATION: DAPT for minimum of 12 months in this patient status post ACS 2 days previously with successful stenting of the circumflex marginal vessel, and is successful staged PCI to diffusely diseased mid LAD today.  Medical therapy for concomitant RCA disease.  Aggressive lipid-lowering therapy with target LDL less than 70 and optimal blood pressure control.  Diagnostic Dominance: Right Intervention         PCI 07/04/2021 Dr. Okey Dupre CORONARY STENT INTERVENTION  LEFT HEART CATH AND CORONARY ANGIOGRAPHY    Conclusion    Conclusions: Significant multivessel coronary artery disease, as detailed below.  Culprit lesion for the patient's NSTEMI is likely occluded ostial/proximal OM2 branch.  There is also severe mid LAD and D1 disease. Low normal left ventricular filling pressure (LVEDP ~5 mmHg). Successful PCI to OM2 using Onyx Frontier 2.5 x 26 mm drug-eluting stent with 0% residual stenosis and TIMI-3 flow.   Recommendations: Continue cangrelor infusion for 2 hours following ticagrelor load. Aggressive secondary prevention. Wean IV nitroglycerin as tolerated. Consider staged PCI to  mid LAD; timing (during this admission versus as an outpatient in the next few weeks) to be determined based on symptoms.     Diagnostic Dominance: Right Intervention        Echocardiogram 07/04/2021 1. Left ventricular ejection fraction, by estimation, is 60 to 65%. The  left ventricle has normal function. The left ventricle has no regional  wall motion abnormalities. The left ventricular internal cavity size was  mildly dilated. There is mild left  ventricular hypertrophy. Left ventricular diastolic parameters are  consistent with Grade I diastolic dysfunction (impaired relaxation).   2. Right ventricular systolic function is normal. The right ventricular  size is normal. There is mildly elevated pulmonary artery systolic  pressure.   3. Left atrial size was mildly dilated.   4. The mitral valve is normal in structure. Mild mitral valve  regurgitation.   5. The aortic valve is tricuspid. Aortic valve regurgitation is mild.  Mild aortic valve sclerosis is present, with no evidence of aortic valve  stenosis.   Comparison(s): The left ventricular function is unchanged.          Lipid Panel    Component Value Date/Time   CHOL 123 10/11/2022 0830   TRIG 56 10/11/2022 0830   HDL 57 10/11/2022 0830   CHOLHDL 2.2 10/11/2022 0830   CHOLHDL 4.4 07/05/2021 0320   VLDL 9 07/05/2021 0320   LDLCALC 54 10/11/2022  0830      Wt Readings from Last 3 Encounters:  04/12/23 161 lb 6.4 oz (73.2 kg)  12/13/22 160 lb 3.2 oz (72.7 kg)  11/14/22 159 lb 9.6 oz (72.4 kg)      ASSESSMENT AND PLAN:   CAD    Pt s/p NSTEMI in October 2022   s/p PTCA/Stent to an occluded OM and then staged procedure for PTCA/DES to LAD        He continues to do well  No CP   No SOB   Continue with activity   2   HTN    BP high in R arm  (discrepancy R and L) Pt ques vs white coat effect REcomm:  Take BP in right arm at home   Bring in log and cuff in about 1 month    Will compare at that time  3   HL  LDL 64  HDL 57  Trig 56   Keep on same meds    Follow up in Jan  with me  Nurses visit in 1 month  Current medicines are reviewed at length with the patient today.  The patient does not have concerns regarding medicines.  Signed, Dietrich Pates, MD  04/12/2023 1:59 PM    [facility] [address] [place], [place], [place]  [address] Phone: [phone]; Fax: [phone]

## 2023-04-12 ENCOUNTER — Ambulatory Visit: Payer: Medicare Other | Attending: Internal Medicine | Admitting: Internal Medicine

## 2023-04-12 ENCOUNTER — Encounter: Payer: Self-pay | Admitting: Internal Medicine

## 2023-04-12 VITALS — BP 150/78 | HR 62 | Ht 69.0 in | Wt 161.4 lb

## 2023-04-12 DIAGNOSIS — I2510 Atherosclerotic heart disease of native coronary artery without angina pectoris: Secondary | ICD-10-CM | POA: Insufficient documentation

## 2023-04-12 NOTE — Patient Instructions (Signed)
Medication Instructions:  Your physician recommends that you continue on your current medications as directed. Please refer to the Current Medication list given to you today.  *If you need a refill on your cardiac medications before your next appointment, please call your pharmacy*   Lab Work: None If you have labs (blood work) drawn today and your tests are completely normal, you will receive your results only by: MyChart Message (if you have MyChart) OR A paper copy in the mail If you have any lab test that is abnormal or we need to change your treatment, we will call you to review the results.   Testing/Procedures: None   Follow-Up: At [facility], you and your health needs are our priority.  As part of our continuing mission to provide you with exceptional heart care, we have created designated Provider Care Teams.  These Care Teams include your primary Cardiologist (physician) and Advanced Practice Providers (APPs -  Physician Assistants and Nurse Practitioners) who all work together to provide you with the care you need, when you need it.  We recommend signing up for the patient portal called "MyChart".  Sign up information is provided on this After Visit Summary.  MyChart is used to connect with patients for Virtual Visits (Telemedicine).  Patients are able to view lab/test results, encounter notes, upcoming appointments, etc.  Non-urgent messages can be sent to your provider as well.   To learn more about what you can do with MyChart, go to [email or web address].    Your next appointment:   5-6 month(s)  Provider:   Dietrich Pates, MD    Other Instructions Nurse Visit-4 weeks- Blood Pressure- Please bring bp cuff to this appointment.

## 2023-04-16 ENCOUNTER — Encounter: Payer: Self-pay | Admitting: "Endocrinology

## 2023-04-16 ENCOUNTER — Ambulatory Visit (INDEPENDENT_AMBULATORY_CARE_PROVIDER_SITE_OTHER): Payer: Medicare Other | Admitting: "Endocrinology

## 2023-04-16 VITALS — BP 136/70 | HR 68 | Ht 69.0 in | Wt 157.4 lb

## 2023-04-16 DIAGNOSIS — E210 Primary hyperparathyroidism: Secondary | ICD-10-CM | POA: Diagnosis not present

## 2023-04-16 DIAGNOSIS — E782 Mixed hyperlipidemia: Secondary | ICD-10-CM | POA: Diagnosis not present

## 2023-04-16 DIAGNOSIS — R7303 Prediabetes: Secondary | ICD-10-CM | POA: Diagnosis not present

## 2023-04-16 DIAGNOSIS — E559 Vitamin D deficiency, unspecified: Secondary | ICD-10-CM | POA: Diagnosis not present

## 2023-04-16 DIAGNOSIS — M8580 Other specified disorders of bone density and structure, unspecified site: Secondary | ICD-10-CM

## 2023-04-16 NOTE — Progress Notes (Signed)
04/16/2023, 11:12 AM  Endocrinology follow-up note   Billy Hunt is a 77 y.o.-year-old male, who is returning for follow-up after he was seen in consultation for hypercalcemia/hyperparathyroidism, osteopenia. PMD:   Anabel Halon, MD   Past Medical History:  Diagnosis Date   Acute coronary syndrome (HCC) 07/04/2021   Allergy 1976   Anxiety    BPH (benign prostatic hyperplasia)    Gout    Hypercholesterolemia    Hyperparathyroidism (HCC)    Hypertension    Myocardial infarction (HCC) 06/2021   Neuromuscular disorder (HCC) 2021   Osteoporosis 2021   Pulmonary emboli (HCC)    Vitamin D deficiency    Vitamin D deficiency disease 10/07/2019    Past Surgical History:  Procedure Laterality Date   CORONARY STENT INTERVENTION N/A 07/04/2021   Procedure: CORONARY STENT INTERVENTION;  Surgeon: Yvonne Kendall, MD;  [facility];  Service: Cardiovascular;  Laterality: N/A;   CORONARY STENT INTERVENTION N/A 07/06/2021   Procedure: CORONARY STENT INTERVENTION;  Surgeon: Lennette Bihari, MD;  [facility];  Service: Cardiovascular;  Laterality: N/A;   LEFT HEART CATH AND CORONARY ANGIOGRAPHY N/A 07/04/2021   Procedure: LEFT HEART CATH AND CORONARY ANGIOGRAPHY;  Surgeon: Yvonne Kendall, MD;  [facility]: [facility];  Service: Cardiovascular;  Laterality: N/A;   PERIPHERAL VASCULAR THROMBECTOMY     TRANSURETHRAL RESECTION OF PROSTATE     VASECTOMY      Social History   Tobacco Use   Smoking status: Former    Current packs/day: 0.00    Average packs/day: 1 pack/day for 10.0 years (10.0 ttl pk-yrs)    Types: Cigarettes    Start date: 04/20/1961    Quit date: 04/21/1971    Years since quitting: 52.0   Smokeless tobacco: Never  Vaping Use   Vaping status: Never Used  Substance Use Topics   Alcohol use: No    Comment: former   Drug use: Yes    Types: Marijuana    Comment: last use: 9/30    Family History  Problem Relation Age  of Onset   Alzheimer's disease Mother        Vascular Dementia   Stroke Mother    Breast cancer Mother    Cancer Father        Multiple Myeloma   Hypertension Father    Deep vein thrombosis Brother    Heart disease Brother    Cancer Paternal Grandmother    Alcohol abuse Paternal Grandfather     Outpatient Encounter Medications as of 04/16/2023  Medication Sig   albuterol (VENTOLIN HFA) 108 (90 Base) MCG/ACT inhaler Inhale 2 puffs into the lungs every 6 (six) hours as needed for wheezing or shortness of breath.   cholecalciferol (VITAMIN D3) 25 MCG (1000 UT) tablet Take 1,000 Units by mouth daily.   cinacalcet (SENSIPAR) 30 MG tablet Take 1 tablet (30 mg total) by mouth daily with supper.   clopidogrel (PLAVIX) 75 MG tablet Take one (1) tablet by mouth (75 mg) daily.   inclisiran (LEQVIO) 284 MG/1.5ML SOSY injection Inject 1.5 mLs (284 mg total) into the skin. Every 6 months   nitroGLYCERIN (NITROSTAT) 0.4 MG SL tablet Place 1 tablet (0.4 mg total) under the tongue every 5 (five) minutes x 3 doses as needed for chest pain.   rosuvastatin (CRESTOR) 5 MG tablet Take 1 tablet (5 mg total) by mouth daily.   tamsulosin (FLOMAX) 0.4 MG CAPS capsule Take  1 capsule (0.4 mg total) by mouth 2 (two) times daily.   venlafaxine XR (EFFEXOR-XR) 150 MG 24 hr capsule Take 1 capsule (150 mg total) by mouth daily.   No facility-administered encounter medications on file as of 04/16/2023.    Allergies  Allergen Reactions   Sulfa Antibiotics Hives   Sulfamethoxazole Rash     HPI  Billy Hunt reports that he was diagnosed with hypercalcemia approximately 7 years ago with blood test showing high PTH and high calcium.  He denies any history of nephrolithiasis, seizure disorder, cardiac dysrhythmias.  Denies any history of CKD.   -He however was found to have osteopenia in 2014.  His repeat bone density in [facility] on November 25, 2018 showed left femur osteopenia with a T score of -1.6, left  forearm radius 33% T score of -0.2, spine excluded due to degenerative changes.   His bone density on November 26, 2020 shows similar findings still consistent with osteopenia. His most recent bone density in March 2024, showing worsening findings, still consistent with osteopenia. -He is currently not on calcium supplements, however on vitamin D3 2000 units daily.  His previsit labs show vitamin D replete at  53.9, calcium stable at 10 mg/dl, and PTH of 70 He remains on Sensipar 30 mg p.o. daily at breakfast.  -He denies any family history of parathyroid, thyroid, pituitary, adrenal dysfunction.    - prior to his last visit, 24-hour urine calcium on November 22, 2018 was 221. He has no new complaints today.  No prior history of fragility fractures or falls.    He is a former  smoker.   he is not on HCTZ or other thiazide therapy.  She is on Leqvio and Crestor for management of dyslipidemia.  His previsit labs show significant dyslipidemia, currently patient not on treatment.  He reports previous intolerance to Lipitor.   BP 136/70   Pulse 68   Ht 5\' 9"  (1.753 m)   Wt 157 lb 6.4 oz (71.4 kg)   BMI 23.24 kg/m , Body mass index is 23.24 kg/m.   CMP     Component Value Date/Time   NA 142 04/05/2023 0824   K 4.8 04/05/2023 0824   CL 105 04/05/2023 0824   CO2 24 04/05/2023 0824   GLUCOSE 87 04/05/2023 0824   GLUCOSE 137 (H) 07/05/2021 0320   BUN 16 04/05/2023 0824   CREATININE 0.94 04/05/2023 0824   CALCIUM 10.0 04/05/2023 0824   PROT 7.0 04/05/2023 0824   ALBUMIN 4.2 04/05/2023 0824   AST 16 04/05/2023 0824   ALT 13 04/05/2023 0824   ALKPHOS 101 04/05/2023 0824   BILITOT 0.5 04/05/2023 0824   GFRNONAA >60 07/05/2021 0320   GFRAA >60 07/27/2017 0100   Most recent official lab reports from September 17, 2018: 25-hydroxy vitamin D 43, PTH 76 elevated, calcium 11.4 mg per DL elevated, BUN 25, creatinine 0.98. -He came with separate handwritten summaries of his labs from 2014 showing  PTH elevated between 89-1 67 between May and June 2014, 25 hydroxy vitamin D ranging between 8.6-27 between June and August 2014, phosphorus range from 2.2-2.3 between May and August 2014, 24-hour urine calcium 192 mg in 24 hours in June 2014. DEXA scan showed mild osteopenia in June 2014.   Surveillance DEXA on November 25, 2018  DualFemur Neck Left 11/25/2018 70.4 N/A -1.6 0.868 g/cm2   Left Forearm Radius 33% 11/25/2018 70.4 N/A -0.2 0.787 g/cm2   ASSESSMENT: BMD as determined from Femur  Neck Left is 0.868 g/cm2 with a T-Score of -1.6.   This patient is considered OSTEOPENIC according to the World Health Organization [facility]) criteria.   DualFemur Neck Left 12/06/2022 74.4 N/A -2.3 0.777 g/cm2 -9.3% Yes DualFemur Neck Left 11/26/2020 72.4 N/A -1.6 0.857 g/cm2 -1.3% - DualFemur Neck Left 11/25/2018 70.4 N/A -1.6 0.868 g/cm2 - -   DualFemur Total Mean 12/06/2022 74.4 N/A -1.8 0.843 g/cm2 -7.8% Yes DualFemur Total Mean 11/26/2020 72.4 N/A -1.3 0.914 g/cm2 -6.3% Yes DualFemur Total Mean 11/25/2018 70.4 N/A -0.9 0.975 g/cm2 - -   Left Forearm Radius 33% 12/06/2022 74.4 N/A -0.7 0.753 g/cm2 -2.0% - Left Forearm Radius 33% 11/26/2020 72.4 N/A -0.5 0.768 g/cm2 -2.3% - Left Forearm Radius 33% 11/25/2018 70.4 N/A -0.2 0.787 g/cm2 - - ASSESSMENT: BMD as determined from Femur Neck Left is 0.777 g/cm2 with a T-score of -2.3. This patient is considered osteopenic by World Healh Organization (WHO) Criteria.The scan quality is good. Compared with the prior study on 11/26/20, the BMD of the total mean shows a statistically significant decrease. Lumbar spine was excluded due to advanced degenerative changes.  Recent Results (from the past 2160 hour(s))  Comprehensive metabolic panel     Status: None   Collection Time: 04/05/23  8:24 AM  Result Value Ref Range   Glucose 87 70 - 99 mg/dL   BUN 16 8 - 27 mg/dL   Creatinine, Ser 1.61 0.76 - 1.27 mg/dL   eGFR 85 >09 UE/AVW/0.98   BUN/Creatinine  Ratio 17 10 - 24   Sodium 142 134 - 144 mmol/L   Potassium 4.8 3.5 - 5.2 mmol/L   Chloride 105 96 - 106 mmol/L   CO2 24 20 - 29 mmol/L   Calcium 10.0 8.6 - 10.2 mg/dL   Total Protein 7.0 6.0 - 8.5 g/dL   Albumin 4.2 3.8 - 4.8 g/dL   Globulin, Total 2.8 1.5 - 4.5 g/dL   Bilirubin Total 0.5 0.0 - 1.2 mg/dL   Alkaline Phosphatase 101 44 - 121 IU/L   AST 16 0 - 40 IU/L   ALT 13 0 - 44 IU/L  VITAMIN D 25 Hydroxy (Vit-D Deficiency, Fractures)     Status: None   Collection Time: 04/05/23  8:24 AM  Result Value Ref Range   Vit D, 25-Hydroxy 60.4 30.0 - 100.0 ng/mL    Comment: Vitamin D deficiency has been defined by the Institute of Medicine and an Endocrine Society practice guideline as a level of serum 25-OH vitamin D less than 20 ng/mL (1,2). The Endocrine Society went on to further define vitamin D insufficiency as a level between 21 and 29 ng/mL (2). 1. IOM (Institute of Medicine). 2010. Dietary reference    intakes for calcium and D. [place]: The    [employer]. 2. Holick MF, Binkley Daggett, Bischoff-Ferrari HA, et al.    Evaluation, treatment, and prevention of vitamin D    deficiency: an Endocrine Society clinical practice    guideline. JCEM. 2011 Jul; 96(7):1911-30.   PTH, intact and calcium     Status: Abnormal   Collection Time: 04/05/23  8:24 AM  Result Value Ref Range   PTH 70 (H) 15 - 65 pg/mL   PTH Interp Comment     Comment: Interpretation                 Intact PTH    Calcium                                 (  pg/mL)      (mg/dL) Normal                          15 - 65     8.6 - 10.2 Primary Hyperparathyroidism         >65          >10.2 Secondary Hyperparathyroidism       >65          <10.2 Non-Parathyroid Hypercalcemia       <65          >10.2 Hypoparathyroidism                  <15          < 8.6 Non-Parathyroid Hypocalcemia    15 - 65          < 8.6     Assessment: 1. Hypercalcemia / Hyperparathyroidism 2.  Osteopenia 3.   Hyperlipidemia  Plan: His previsit repeat labs show stable calcium at 10 and PTH at 70.  He is advised to continue Sensipar 30 mg p.o. daily at  supper.  His repeat  bone density was consistent with worsening finding, stable consistent with osteopenia of femur.  His lumbar spine was excluded due to degenerative joint diseases.  His prior 24-hour urine calcium was 221.    This is still consistent with primary hyperparathyroidism at a mild form.  He does not have criteria for considering surgery at this time.  He will be considered for repeat bone density in March 2026.    -He remains on low-dose vitamin D supplement.  His current vitamin D supplements is with vitamin D3 1000 units daily, advised to continue.     From the point of view of hyperlipidemia on the background of coronary artery disease, he remains to be a good candidate for lifestyle medicine.  His recent labs show LDL control at 54.  Patient now reveals that he is on Leqvio 284 mg subcutaneously every 6 months.  He is also on Crestor 5 mg nightly.    -  he will continue to benefit from lifestyle medicine.  His most recent A1c was 5.6%.  - he acknowledges that there is a room for improvement in his food and drink choices. - Suggestion is made for him to avoid simple carbohydrates  from his diet including Cakes, Sweet Desserts, Ice Cream, Soda (diet and regular), Sweet Tea, Candies, Chips, Cookies, Store Bought Juices, Alcohol , Artificial Sweeteners,  Coffee Creamer, and "Sugar-free" Products, Lemonade. This will help patient to have more stable blood glucose profile and potentially avoid unintended weight gain.  The following Lifestyle Medicine recommendations according to American College of Lifestyle Medicine  [facility]) were discussed and and offered to patient and he  agrees to start the journey:  A. Whole Foods, Plant-Based Nutrition comprising of fruits and vegetables, plant-based proteins, whole-grain carbohydrates was discussed  in detail with the patient.   A list for source of those nutrients were also provided to the patient.  Patient will use only water or unsweetened tea for hydration. B.  The need to stay away from risky substances including alcohol, smoking; obtaining 7 to 9 hours of restorative sleep, at least 150 minutes of moderate intensity exercise weekly, the importance of healthy social connections,  and stress management techniques were discussed. C.  A full color page of  Calorie density of various food groups per pound showing examples of each food groups was  provided to the patient.    -He is advised to continue his primary care follow-up with Dr. Allena Katz.    I spent  22  minutes in the care of the patient today including review of labs from Thyroid Function, CMP, and other relevant labs ; imaging/biopsy records (current and previous including abstractions from other facilities); face-to-face time discussing  his lab results and symptoms, medications doses, his options of short and long term treatment based on the latest standards of care / guidelines;   and documenting the encounter.  [name]  participated in the discussions, expressed understanding, and voiced agreement with the above plans.  All questions were answered to his satisfaction. he is encouraged to contact clinic should he have any questions or concerns prior to his return visit.   - Return in about 6 months (around 10/17/2023) for Fasting Labs  in AM B4 8.   Marquis Lunch, MD [facility] [address] [place], [place] [address] Phone: [phone]  Fax: [phone]    This note was partially dictated with voice recognition software. Similar sounding words can be transcribed inadequately or may not  be corrected upon review.  04/16/2023, 11:12 AM

## 2023-04-17 ENCOUNTER — Ambulatory Visit (INDEPENDENT_AMBULATORY_CARE_PROVIDER_SITE_OTHER): Payer: Medicare Other

## 2023-04-17 VITALS — BP 149/72 | HR 54 | Temp 97.5°F | Resp 16 | Ht 69.0 in | Wt 158.8 lb

## 2023-04-17 DIAGNOSIS — I25110 Atherosclerotic heart disease of native coronary artery with unstable angina pectoris: Secondary | ICD-10-CM

## 2023-04-17 DIAGNOSIS — E782 Mixed hyperlipidemia: Secondary | ICD-10-CM | POA: Diagnosis not present

## 2023-04-17 MED ORDER — INCLISIRAN SODIUM 284 MG/1.5ML ~~LOC~~ SOSY
284.0000 mg | PREFILLED_SYRINGE | Freq: Once | SUBCUTANEOUS | Status: AC
  Administered 2023-04-17: 284 mg via SUBCUTANEOUS
  Filled 2023-04-17: qty 1.5

## 2023-04-17 NOTE — Progress Notes (Signed)
Diagnosis: Hyperlipidemia  Provider:  Chilton Greathouse MD  Procedure: Injection  Leqvio (inclisiran), Dose: 284 mg, Site: subcutaneous, Number of injections: 1  Right lower abdomen  Post Care: Patient declined observation  Discharge: Condition: Good, Destination: Home . AVS Provided  Performed by:  Marlow Baars [place]tt, RN

## 2023-05-11 ENCOUNTER — Ambulatory Visit: Payer: Medicare Other | Attending: Cardiology

## 2023-05-11 VITALS — BP 124/58 | HR 74

## 2023-05-11 DIAGNOSIS — Z0130 Encounter for examination of blood pressure without abnormal findings: Secondary | ICD-10-CM | POA: Insufficient documentation

## 2023-05-11 NOTE — Progress Notes (Signed)
BP today in office 148/58 manual  Pt's home manual machine, brought two different cuffs purchased from [facility] and [facility] along with several stethoscopes   Readings: 124/58, 122/60  Patient also presented home BP's ranging 108/60- 132/68    I will FYI Dr.Ross

## 2023-05-11 NOTE — Patient Instructions (Addendum)
Thank you for bringing in your home BP cuffs and BP log.   I will forward your readings to Dr.Ross    Thank you for choosing [facility] !

## 2023-05-15 ENCOUNTER — Other Ambulatory Visit: Payer: Medicare Other

## 2023-05-15 DIAGNOSIS — R972 Elevated prostate specific antigen [PSA]: Secondary | ICD-10-CM | POA: Diagnosis not present

## 2023-05-18 ENCOUNTER — Encounter: Payer: Self-pay | Admitting: Internal Medicine

## 2023-05-18 ENCOUNTER — Ambulatory Visit (INDEPENDENT_AMBULATORY_CARE_PROVIDER_SITE_OTHER): Payer: Medicare Other | Admitting: Internal Medicine

## 2023-05-18 VITALS — BP 136/73 | HR 62 | Ht 69.0 in | Wt 159.0 lb

## 2023-05-18 DIAGNOSIS — N401 Enlarged prostate with lower urinary tract symptoms: Secondary | ICD-10-CM | POA: Diagnosis not present

## 2023-05-18 DIAGNOSIS — Z23 Encounter for immunization: Secondary | ICD-10-CM

## 2023-05-18 DIAGNOSIS — I10 Essential (primary) hypertension: Secondary | ICD-10-CM

## 2023-05-18 DIAGNOSIS — E210 Primary hyperparathyroidism: Secondary | ICD-10-CM | POA: Diagnosis not present

## 2023-05-18 DIAGNOSIS — Z1211 Encounter for screening for malignant neoplasm of colon: Secondary | ICD-10-CM

## 2023-05-18 DIAGNOSIS — I25110 Atherosclerotic heart disease of native coronary artery with unstable angina pectoris: Secondary | ICD-10-CM

## 2023-05-18 DIAGNOSIS — F411 Generalized anxiety disorder: Secondary | ICD-10-CM | POA: Diagnosis not present

## 2023-05-18 DIAGNOSIS — N138 Other obstructive and reflux uropathy: Secondary | ICD-10-CM

## 2023-05-18 HISTORY — DX: Encounter for screening for malignant neoplasm of colon: Z12.11

## 2023-05-18 NOTE — Assessment & Plan Note (Signed)
BP Readings from Last 1 Encounters:  05/18/23 136/73   Well-controlled with diet alone Discontinued Losartan and Coreg in the last visit due to low BP at home Advised DASH diet and moderate exercise/walking, at least 150 mins/week

## 2023-05-18 NOTE — Progress Notes (Signed)
Established Patient Office Visit  Subjective:  Patient ID: Billy Hunt, male    DOB: 12-02-1945  Age: 77 y.o. MRN: [id number]  CC:  Chief Complaint  Patient presents with   Hypertension    Six month follow up    Coronary Artery Disease    Six month follow up     HPI Billy Hunt is a 77 y.o. male with past medical history of CAD s/p stent placement, hyperparathyroidism, osteopenia, BPH, PE, HLD, gout and anxiety who presents for f/u of his chronic medical conditions.  CAD and HTN:  BP is well-controlled. Patient denies headache, dizziness, chest pain, dyspnea or palpitations. He is on Plavix currently. He is in research study with Leqvio for HLD and is tolerating it well. His liver enzymes were elevated with statin, which have trended down since stopping it.  BPH: Currently well-controlled with Flomax.  His urinary hesitancy has improved with Flomax twice daily.  His PSA was slightly higher compared to prior, followed by urology.  Denies any dysuria or hematuria currently.  Hyperparathyroidism: He takes Cinacalcet for it.  His calcium level was WNL.  Past Medical History:  Diagnosis Date   Acute coronary syndrome (HCC) 07/04/2021   Allergy 1976   Anxiety    BPH (benign prostatic hyperplasia)    Colon cancer screening 05/18/2023   Gout    Hypercholesterolemia    Hyperparathyroidism (HCC)    Hypertension    Myocardial infarction (HCC) 06/2021   Neuromuscular disorder (HCC) 2021   Osteoporosis 2021   Pulmonary emboli (HCC)    Vitamin D deficiency    Vitamin D deficiency disease 10/07/2019    Past Surgical History:  Procedure Laterality Date   CORONARY STENT INTERVENTION N/A 07/04/2021   Procedure: CORONARY STENT INTERVENTION;  Surgeon: Yvonne Kendall, MD;  [facility];  Service: Cardiovascular;  Laterality: N/A;   CORONARY STENT INTERVENTION N/A 07/06/2021   Procedure: CORONARY STENT INTERVENTION;  Surgeon: Lennette Bihari, MD;  Location: [facility]  INVAS[facility] CV [facility];  Service: Cardiovascular;  Laterality: N/A;   LEFT HEART CATH AND CORONARY ANGIOGRAPHY N/A 07/04/2021   Procedure: LEFT HEART CATH AND CORONARY ANGIOGRAPHY;  Surgeon: Yvonne Kendall, MD;  Location: [facility];  Service: Cardiovascular;  Laterality: N/A;   PERIPHERAL VASCULAR THROMBECTOMY     TRANSURETHRAL RESECTION OF PROSTATE     VASECTOMY      Family History  Problem Relation Age of Onset   Alzheimer's disease Mother        Vascular Dementia   Stroke Mother    Breast cancer Mother    Cancer Father        Multiple Myeloma   Hypertension Father    Deep vein thrombosis Brother    Heart disease Brother    Cancer Paternal Grandmother    Alcohol abuse Paternal Grandfather     Social History   Socioeconomic History   Marital status: Divorced    Spouse name: Not on file   Number of children: 2   Years of education: 12   Highest education level: Manufacturing engineer (e.g., MA, MS, MEng, MEd, MSW, MBA)  Occupational History   Not on file  Tobacco Use   Smoking status: Former    Current packs/day: 0.00    Average packs/day: 1 pack/day for 10.0 years (10.0 ttl pk-yrs)    Types: Cigarettes    Start date: 04/20/1961    Quit date: 04/21/1971    Years since quitting: 52.1   Smokeless tobacco:  Never  Vaping Use   Vaping status: Never Used  Substance and Sexual Activity   Alcohol use: No    Comment: former   Drug use: Yes    Types: Marijuana    Comment: last use: 9/30   Sexual activity: Yes    Birth control/protection: Other-see comments  Other Topics Concern   Not on file  Social History Narrative   Not on file   Social Determinants of Health   Financial Resource Strain: Low Risk  (09/24/2022)   Overall Financial Resource Strain (CARDIA)    Difficulty of Paying Living Expenses: Not hard at all  Food Insecurity: No Food Insecurity (09/24/2022)   Hunger Vital Sign    Worried About Running Out of Food in the Last Year: Never true    Ran Out of Food in  the Last Year: Never true  Transportation Needs: No Transportation Needs (09/24/2022)   PRAPARE - Administrator, Civil Service (Medical): No    Lack of Transportation (Non-Medical): No  Physical Activity: Insufficiently Active (09/24/2022)   Exercise Vital Sign    Days of Exercise per Week: 3 days    Minutes of Exercise per Session: 40 min  Stress: No Stress Concern Present (09/24/2022)   [employer] of Occupational Health - Occupational Stress Questionnaire    Feeling of Stress : Not at all  Social Connections: Unknown (09/24/2022)   Social Connection and Isolation Panel [NHANES]    Frequency of Communication with Friends and Family: Three times a week    Frequency of Social Gatherings with Friends and Family: Once a week    Attends Religious Services: Not on Marketing executive or Organizations: No    Attends Banker Meetings: Never    Marital Status: Living with partner  Recent Concern: Social Connections - Moderately Isolated (09/24/2022)   Social Connection and Isolation Panel [NHANES]    Frequency of Communication with Friends and Family: Three times a week    Frequency of Social Gatherings with Friends and Family: Once a week    Attends Religious Services: Never    Database administrator or Organizations: No    Attends Banker Meetings: Never    Marital Status: Living with partner  Intimate Partner Violence: Unknown (09/20/2021)   Humiliation, Afraid, Rape, and Kick questionnaire    Fear of Current or Ex-Partner: No    Emotionally Abused: No    Physically Abused: Not on file    Sexually Abused: No    Outpatient Medications Prior to Visit  Medication Sig Dispense Refill   albuterol (VENTOLIN HFA) 108 (90 Base) MCG/ACT inhaler Inhale 2 puffs into the lungs every 6 (six) hours as needed for wheezing or shortness of breath. 8 g 0   cholecalciferol (VITAMIN D3) 25 MCG (1000 UT) tablet Take 1,000 Units by mouth daily.      cinacalcet (SENSIPAR) 30 MG tablet Take 1 tablet (30 mg total) by mouth daily with supper. 90 tablet 0   clopidogrel (PLAVIX) 75 MG tablet Take one (1) tablet by mouth (75 mg) daily. 90 tablet 3   inclisiran (LEQVIO) 284 MG/1.5ML SOSY injection Inject 1.5 mLs (284 mg total) into the skin. Every 6 months 1.5 mL 0   nitroGLYCERIN (NITROSTAT) 0.4 MG SL tablet Place 1 tablet (0.4 mg total) under the tongue every 5 (five) minutes x 3 doses as needed for chest pain. 25 tablet 2   rosuvastatin (CRESTOR) 5 MG tablet Take 1  tablet (5 mg total) by mouth daily. 90 tablet 3   tamsulosin (FLOMAX) 0.4 MG CAPS capsule Take 1 capsule (0.4 mg total) by mouth 2 (two) times daily. 180 capsule 3   venlafaxine XR (EFFEXOR-XR) 150 MG 24 hr capsule Take 1 capsule (150 mg total) by mouth daily. 90 capsule 3   No facility-administered medications prior to visit.    Allergies  Allergen Reactions   Sulfa Antibiotics Hives   Sulfamethoxazole Rash    ROS Review of Systems  Constitutional:  Negative for chills and fever.  HENT:  Negative for congestion and sore throat.   Eyes:  Negative for pain and discharge.  Respiratory:  Negative for cough and shortness of breath.   Cardiovascular:  Negative for chest pain and palpitations.  Gastrointestinal:  Negative for diarrhea, nausea and vomiting.  Endocrine: Negative for polydipsia and polyuria.  Genitourinary:  Negative for dysuria and hematuria.  Musculoskeletal:  Negative for neck pain and neck stiffness.       Right shoulder pain  Skin:  Negative for rash.  Neurological:  Negative for dizziness, weakness and headaches.  Psychiatric/Behavioral:  Negative for agitation and behavioral problems.       Objective:    Physical Exam Vitals reviewed.  Constitutional:      General: He is not in acute distress.    Appearance: He is not diaphoretic.  HENT:     Head: Normocephalic and atraumatic.     Nose: Nose normal.     Mouth/Throat:     Mouth: Mucous membranes  are moist.  Eyes:     General: No scleral icterus.    Extraocular Movements: Extraocular movements intact.  Cardiovascular:     Rate and Rhythm: Normal rate and regular rhythm.     Pulses: Normal pulses.     Heart sounds: Normal heart sounds. No murmur heard. Pulmonary:     Breath sounds: Normal breath sounds. No wheezing or rales.  Musculoskeletal:     Right shoulder: Tenderness present. Decreased range of motion.     Cervical back: Neck supple. No tenderness.     Right lower leg: No edema.     Left lower leg: No edema.  Skin:    General: Skin is warm.     Findings: No rash.  Neurological:     General: No focal deficit present.     Mental Status: He is alert and oriented to person, place, and time.  Psychiatric:        Mood and Affect: Mood normal.        Behavior: Behavior normal.     BP 136/73 (BP Location: Right Arm, Patient Position: Sitting, Cuff Size: Normal)   Pulse 62   Ht 5\' 9"  (1.753 m)   Wt 159 lb (72.1 kg)   SpO2 98%   BMI 23.48 kg/m  Wt Readings from Last 3 Encounters:  05/18/23 159 lb (72.1 kg)  04/17/23 158 lb 12.8 oz (72 kg)  04/16/23 157 lb 6.4 oz (71.4 kg)    Lab Results  Component Value Date   TSH 1.910 05/10/2022   Lab Results  Component Value Date   WBC 5.1 05/10/2022   HGB 15.0 05/10/2022   HCT 45.5 05/10/2022   MCV 92 05/10/2022   PLT 214 05/10/2022   Lab Results  Component Value Date   NA 142 04/05/2023   K 4.8 04/05/2023   CO2 24 04/05/2023   GLUCOSE 87 04/05/2023   BUN 16 04/05/2023   CREATININE 0.94 04/05/2023  BILITOT 0.5 04/05/2023   ALKPHOS 101 04/05/2023   AST 16 04/05/2023   ALT 13 04/05/2023   PROT 7.0 04/05/2023   ALBUMIN 4.2 04/05/2023   CALCIUM 10.0 04/05/2023   ANIONGAP 9 07/05/2021   EGFR 85 04/05/2023   Lab Results  Component Value Date   CHOL 123 10/11/2022   Lab Results  Component Value Date   HDL 57 10/11/2022   Lab Results  Component Value Date   LDLCALC 54 10/11/2022   Lab Results   Component Value Date   TRIG 56 10/11/2022   Lab Results  Component Value Date   CHOLHDL 2.2 10/11/2022   Lab Results  Component Value Date   HGBA1C 5.6 05/10/2022      Assessment & Plan:   Problem List Items Addressed This Visit       Cardiovascular and Mediastinum   HTN (hypertension) - Primary    BP Readings from Last 1 Encounters:  05/18/23 136/73   Well-controlled with diet alone Discontinued Losartan and Coreg in the last visit due to low BP at home Advised DASH diet and moderate exercise/walking, at least 150 mins/week      Coronary artery disease involving native coronary artery of native heart with unstable angina pectoris (HCC)    S/p PCI X 2 (10/22) On Plavix and Leqvio No chest pain or dyspnea currently Followed by Cardiology        Endocrine   Hyperparathyroidism, primary (HCC)    Stable On Cinacalcet Followed by Dr Fransico Him        Genitourinary   BPH with obstruction/lower urinary tract symptoms    S/p TURP On Tamsulosin, refilled Followed by Urology        Other   GAD (generalized anxiety disorder)    Well-controlled with Effexor      Colon cancer screening    Discussed about colonoscopy and cologuard - benefits of each procedure discussed. Patient prefers Cologuard - ordered.      Relevant Orders   Cologuard   Other Visit Diagnoses     Encounter for immunization       Relevant Orders   Flu Vaccine Trivalent High Dose (Fluad) (Completed)       No orders of the defined types were placed in this encounter.   Follow-up: Return in about 6 months (around 11/15/2023).    Anabel Halon, MD

## 2023-05-18 NOTE — Assessment & Plan Note (Signed)
Discussed about colonoscopy and cologuard - benefits of each procedure discussed. Patient prefers Cologuard - ordered.

## 2023-05-18 NOTE — Assessment & Plan Note (Signed)
Stable On Cinacalcet Followed by Dr Nida 

## 2023-05-18 NOTE — Patient Instructions (Signed)
Please continue to take medications as prescribed.  Please continue to follow low salt diet and perform moderate exercise/walking at least 150 mins/week. 

## 2023-05-18 NOTE — Assessment & Plan Note (Signed)
S/p TURP On Tamsulosin, refilled Followed by Urology

## 2023-05-18 NOTE — Assessment & Plan Note (Signed)
Well-controlled with Effexor

## 2023-05-18 NOTE — Assessment & Plan Note (Addendum)
S/p PCI X 2 (10/22) On Plavix and Leqvio No chest pain or dyspnea currently Followed by Cardiology

## 2023-05-25 ENCOUNTER — Ambulatory Visit (INDEPENDENT_AMBULATORY_CARE_PROVIDER_SITE_OTHER): Payer: Medicare Other | Admitting: Urology

## 2023-05-25 ENCOUNTER — Encounter: Payer: Self-pay | Admitting: Urology

## 2023-05-25 VITALS — BP 149/80 | HR 56

## 2023-05-25 DIAGNOSIS — R972 Elevated prostate specific antigen [PSA]: Secondary | ICD-10-CM

## 2023-05-25 DIAGNOSIS — R350 Frequency of micturition: Secondary | ICD-10-CM

## 2023-05-25 DIAGNOSIS — N138 Other obstructive and reflux uropathy: Secondary | ICD-10-CM | POA: Diagnosis not present

## 2023-05-25 DIAGNOSIS — R339 Retention of urine, unspecified: Secondary | ICD-10-CM | POA: Diagnosis not present

## 2023-05-25 DIAGNOSIS — N401 Enlarged prostate with lower urinary tract symptoms: Secondary | ICD-10-CM

## 2023-05-25 LAB — URINALYSIS, ROUTINE W REFLEX MICROSCOPIC
Bilirubin, UA: NEGATIVE
Glucose, UA: NEGATIVE
Ketones, UA: NEGATIVE
Leukocytes,UA: NEGATIVE
Nitrite, UA: NEGATIVE
Protein,UA: NEGATIVE
Specific Gravity, UA: 1.01 (ref 1.005–1.030)
Urobilinogen, Ur: 0.2 mg/dL (ref 0.2–1.0)
pH, UA: 6.5 (ref 5.0–7.5)

## 2023-05-25 LAB — MICROSCOPIC EXAMINATION
Bacteria, UA: NONE SEEN
Epithelial Cells (non renal): NONE SEEN /HPF (ref 0–10)
WBC, UA: NONE SEEN /HPF (ref 0–5)

## 2023-05-25 LAB — BLADDER SCAN AMB NON-IMAGING: Scan Result: 223

## 2023-05-25 MED ORDER — LEVOFLOXACIN 750 MG PO TABS: 750.0000 mg | ORAL_TABLET | Freq: Once | ORAL | 0 refills | Status: DC

## 2023-05-25 MED ORDER — TAMSULOSIN HCL 0.4 MG PO CAPS
0.4000 mg | ORAL_CAPSULE | Freq: Two times a day (BID) | ORAL | 3 refills | Status: DC
Start: 2023-05-25 — End: 2023-11-28

## 2023-05-25 NOTE — Progress Notes (Unsigned)
post void residual=223

## 2023-05-25 NOTE — Patient Instructions (Signed)

## 2023-05-25 NOTE — Progress Notes (Unsigned)
05/25/2023 9:03 AM   Billy Hunt Dec 16, 1945 [id number]  Referring provider: Anabel Halon, MD [address] [place],  [place] [address]  Followup elevated PSA and BPH   HPI: PSA increased to 6.4 from 5.1. PSA last year was 7.6. IPSS 16 QOL 3 on flomax BID. PVR 223cc. Uirne strema strong. No strainignt o urinate. Nocturia 1-3x.    PMH: Past Medical History:  Diagnosis Date   Acute coronary syndrome (HCC) 07/04/2021   Allergy 1976   Anxiety    BPH (benign prostatic hyperplasia)    Colon cancer screening 05/18/2023   Gout    Hypercholesterolemia    Hyperparathyroidism (HCC)    Hypertension    Myocardial infarction (HCC) 06/2021   Neuromuscular disorder (HCC) 2021   Osteoporosis 2021   Pulmonary emboli (HCC)    Vitamin D deficiency    Vitamin D deficiency disease 10/07/2019    Surgical History: Past Surgical History:  Procedure Laterality Date   CORONARY STENT INTERVENTION N/A 07/04/2021   Procedure: CORONARY STENT INTERVENTION;  Surgeon: Yvonne Kendall, MD;  [facility]: [facility];  Service: Cardiovascular;  Laterality: N/A;   CORONARY STENT INTERVENTION N/A 07/06/2021   Procedure: CORONARY STENT INTERVENTION;  Surgeon: Lennette Bihari, MD;  Location: [facility];  Service: Cardiovascular;  Laterality: N/A;   LEFT HEART CATH AND CORONARY ANGIOGRAPHY N/A 07/04/2021   Procedure: LEFT HEART CATH AND CORONARY ANGIOGRAPHY;  Surgeon: Yvonne Kendall, MD;  Location: [facility];  Service: Cardiovascular;  Laterality: N/A;   PERIPHERAL VASCULAR THROMBECTOMY     TRANSURETHRAL RESECTION OF PROSTATE     VASECTOMY      Home Medications:  Allergies as of 05/25/2023       Reactions   Sulfa Antibiotics Hives   Sulfamethoxazole Rash        Medication List        Accurate as of May 25, 2023  9:03 AM. If you have any questions, ask your nurse or doctor.          albuterol 108 (90 Base) MCG/ACT inhaler Commonly known as: VENTOLIN  HFA Inhale 2 puffs into the lungs every 6 (six) hours as needed for wheezing or shortness of breath.   cholecalciferol 25 MCG (1000 UNIT) tablet Commonly known as: VITAMIN D3 Take 1,000 Units by mouth daily.   cinacalcet 30 MG tablet Commonly known as: SENSIPAR Take 1 tablet (30 mg total) by mouth daily with supper.   clopidogrel 75 MG tablet Commonly known as: PLAVIX Take one (1) tablet by mouth (75 mg) daily.   Leqvio 284 MG/1.5ML Sosy injection Generic drug: inclisiran Inject 1.5 mLs (284 mg total) into the skin. Every 6 months   nitroGLYCERIN 0.4 MG SL tablet Commonly known as: NITROSTAT Place 1 tablet (0.4 mg total) under the tongue every 5 (five) minutes x 3 doses as needed for chest pain.   rosuvastatin 5 MG tablet Commonly known as: CRESTOR Take 1 tablet (5 mg total) by mouth daily.   tamsulosin 0.4 MG Caps capsule Commonly known as: FLOMAX Take 1 capsule (0.4 mg total) by mouth 2 (two) times daily.   venlafaxine XR 150 MG 24 hr capsule Commonly known as: EFFEXOR-XR Take 1 capsule (150 mg total) by mouth daily.        Allergies:  Allergies  Allergen Reactions   Sulfa Antibiotics Hives   Sulfamethoxazole Rash    Family History: Family History  Problem Relation Age of Onset   Alzheimer's disease Mother  Vascular Dementia   Stroke Mother    Breast cancer Mother    Cancer Father        Multiple Myeloma   Hypertension Father    Deep vein thrombosis Brother    Heart disease Brother    Cancer Paternal Grandmother    Alcohol abuse Paternal Grandfather     Social History:  reports that he quit smoking about 52 years ago. His smoking use included cigarettes. He started smoking about 62 years ago. He has a 10 pack-year smoking history. He has never used smokeless tobacco. He reports current drug use. Drug: Marijuana. He reports that he does not drink alcohol.  ROS: All other review of systems were reviewed and are negative except what is noted above  in HPI  Physical Exam: BP (!) 149/80   Pulse (!) 56   Constitutional:  Alert and oriented, No acute distress. HEENT: [place] AT, moist mucus membranes.  Trachea midline, no masses. Cardiovascular: No clubbing, cyanosis, or edema. Respiratory: Normal respiratory effort, no increased work of breathing. GI: Abdomen is soft, nontender, nondistended, no abdominal masses GU: No CVA tenderness.  Lymph: No cervical or inguinal lymphadenopathy. Skin: No rashes, bruises or suspicious lesions. Neurologic: Grossly intact, no focal deficits, moving all 4 extremities. Psychiatric: Normal mood and affect.  Laboratory Data: Lab Results  Component Value Date   WBC 5.1 05/10/2022   HGB 15.0 05/10/2022   HCT 45.5 05/10/2022   MCV 92 05/10/2022   PLT 214 05/10/2022    Lab Results  Component Value Date   CREATININE 0.94 04/05/2023    No results found for: "PSA"  No results found for: "TESTOSTERONE"  Lab Results  Component Value Date   HGBA1C 5.6 05/10/2022    Urinalysis    Component Value Date/Time   COLORURINE YELLOW 06/12/2015 0855   APPEARANCEUR Clear 11/17/2022 0839   LABSPEC 1.010 06/12/2015 0855   PHURINE 6.0 06/12/2015 0855   GLUCOSEU Negative 11/17/2022 0839   HGBUR LARGE (A) 06/12/2015 0855   BILIRUBINUR Negative 11/17/2022 0839   KETONESUR small (15) (A) 12/01/2021 0920   KETONESUR NEGATIVE 06/12/2015 0855   PROTEINUR Trace 11/17/2022 0839   PROTEINUR NEGATIVE 06/12/2015 0855   UROBILINOGEN 4.0 (A) 12/01/2021 0920   UROBILINOGEN 0.2 06/12/2015 0855   NITRITE Negative 11/17/2022 0839   NITRITE NEGATIVE 06/12/2015 0855   LEUKOCYTESUR Negative 11/17/2022 0839    Lab Results  Component Value Date   LABMICR See below: 11/17/2022   WBCUA 0-5 11/17/2022   LABEPIT 0-10 11/17/2022   MUCUS Present 05/02/2022   BACTERIA None seen 11/17/2022    Pertinent Imaging: *** No results found for this or any previous visit.  No results found for this or any previous visit.  No  results found for this or any previous visit.  No results found for this or any previous visit.  No results found for this or any previous visit.  No valid procedures specified. Results for orders placed during the hospital encounter of 12/09/21  CT HEMATURIA WORKUP  Narrative CLINICAL DATA:  Gross hematuria for 2 weeks, elevated PSA, history of BPH and TURP  EXAM: CT ABDOMEN AND PELVIS WITHOUT AND WITH CONTRAST  TECHNIQUE: Multidetector CT imaging of the abdomen and pelvis was performed following the standard protocol before and following the bolus administration of intravenous contrast.  RADIATION DOSE REDUCTION: This exam was performed according to the departmental dose-optimization program which includes automated exposure control, adjustment of the mA and/or kV according to patient size and/or use of  iterative reconstruction technique.  CONTRAST:  OMNIPAQUE IOHEXOL 300 MG/ML  SOLN  COMPARISON:  None.  FINDINGS: Lower chest: No acute abnormality. Eventration of the posterior right hemidiaphragm with associated scarring and atelectasis. Coronary artery calcifications. Small hiatal hernia.  Hepatobiliary: No solid liver abnormality is seen. No gallstones, gallbladder wall thickening, or biliary dilatation.  Pancreas: Unremarkable. No pancreatic ductal dilatation or surrounding inflammatory changes.  Spleen: Normal in size without significant abnormality.  Adrenals/Urinary Tract: Adrenal glands are unremarkable. The right kidney is in a very high position due to eventration of the right hemidiaphragm. Punctuate nonobstructive calculi of the superior pole of the right kidney (series 3, image 6). No left-sided calculi, ureteral calculi, or hydronephrosis. No suspicious mass or contrast enhancement. Simple, benign cyst of the posterior midportion of the left kidney, for which no further follow-up or characterization is required. No urinary tract filling defect  on delayed phase imaging. Bladder is unremarkable.  Stomach/Bowel: Stomach is within normal limits. Appendix appears normal. No evidence of bowel wall thickening, distention, or inflammatory changes. Descending and sigmoid diverticulosis. Large burden of stool throughout the colon and rectum.  Vascular/Lymphatic: Aortic atherosclerosis. No enlarged abdominal or pelvic lymph nodes.  Reproductive: Severe prostatomegaly with TURP defect.  Other: No abdominal wall hernia or abnormality. No ascites.  Musculoskeletal: No acute or significant osseous findings.  IMPRESSION: 1. Punctuate nonobstructive calculi of the superior pole of the right kidney. No left-sided calculi, ureteral calculi, or hydronephrosis. 2. No suspicious mass or contrast enhancement. No urinary tract filling defect on delayed phase imaging. 3. Severe prostatomegaly with TURP defect. 4. Descending and sigmoid diverticulosis without evidence of acute diverticulitis. 5. Coronary artery disease.  Aortic Atherosclerosis (ICD10-I70.0).   Electronically Signed By: Jearld Lesch M.D. On: 12/10/2021 11:00  No results found for this or any previous visit.   Assessment & Plan:    1. Incomplete bladder emptying Continue flomax 0.4mg  BID - Urinalysis, Routine w reflex microscopic - BLADDER SCAN AMB NON-IMAGING  2. BPH with obstruction/lower urinary tract symptoms -continue flomax BID - Urinalysis, Routine w reflex microscopic - BLADDER SCAN AMB NON-IMAGING  3. Elevated PSA -followup 6 months with PSA - Urinalysis, Routine w reflex microscopic   No follow-ups on file.  Wilkie Aye, MD  [facility] [place]

## 2023-05-30 DIAGNOSIS — Z1211 Encounter for screening for malignant neoplasm of colon: Secondary | ICD-10-CM | POA: Diagnosis not present

## 2023-06-04 LAB — COLOGUARD: COLOGUARD: POSITIVE — AB

## 2023-06-05 ENCOUNTER — Other Ambulatory Visit: Payer: Self-pay

## 2023-06-05 DIAGNOSIS — R195 Other fecal abnormalities: Secondary | ICD-10-CM

## 2023-06-06 ENCOUNTER — Encounter (INDEPENDENT_AMBULATORY_CARE_PROVIDER_SITE_OTHER): Payer: Self-pay | Admitting: *Deleted

## 2023-06-08 ENCOUNTER — Ambulatory Visit: Payer: Medicare Other | Admitting: Internal Medicine

## 2023-06-10 ENCOUNTER — Other Ambulatory Visit: Payer: Self-pay | Admitting: "Endocrinology

## 2023-06-11 ENCOUNTER — Encounter (INDEPENDENT_AMBULATORY_CARE_PROVIDER_SITE_OTHER): Payer: Self-pay | Admitting: Gastroenterology

## 2023-06-11 ENCOUNTER — Ambulatory Visit (INDEPENDENT_AMBULATORY_CARE_PROVIDER_SITE_OTHER): Payer: Medicare Other | Admitting: Gastroenterology

## 2023-06-11 VITALS — BP 156/82 | HR 65 | Temp 97.1°F | Ht 69.0 in | Wt 159.1 lb

## 2023-06-11 DIAGNOSIS — R195 Other fecal abnormalities: Secondary | ICD-10-CM | POA: Diagnosis not present

## 2023-06-11 NOTE — Patient Instructions (Signed)
Schedule colonoscopy will need to obtain clearance from cardiologist to stop Plavix 5 days before the procedure

## 2023-06-11 NOTE — Progress Notes (Signed)
Katrinka Blazing, M.D. Gastroenterology & Hepatology [facility] [address] [place], [place] [address] Primary Care Physician: Anabel Halon, MD [address] [place] [address]  Referring MD: PCP  Chief Complaint: Positive Cologuard  History of Present Illness: Billy Hunt is a 77 y.o. male with past medical history of coronary artery disease, BPH, gout, hyperlipidemia, hyperparathyroidism, hypertension, osteoporosis, who presents for evaluation of positive Cologuard.  Patient was referred to our clinic after presenting a positive Cologuard test on 05/30/2023.  Most recent labs from 04/05/2023 showed a normal CMP.  No recent CBCs available.  The patient denies having any nausea, vomiting, fever, chills, hematochezia, melena, hematemesis, abdominal distention, abdominal pain, diarrhea, jaundice, pruritus or weight loss.  Patient had a  NSTEMI on 2022, had two stents placed and is currently on Plavix.  Last WGN:FAOZHYQM Last Colonoscopy:Possibly 10 years ago, had it performed at [facility] - states it was normal, no report available  FHx: neg for any gastrointestinal/liver disease, father multiple myeloma, mother breast cancer Social: neg smoking, used to drink alcohol - quit 9 years ago, used to smoke marijuana in the past Surgical: no abdominal surgeries  Past Medical History: Past Medical History:  Diagnosis Date   Acute coronary syndrome (HCC) 07/04/2021   Allergy 1976   Anxiety    BPH (benign prostatic hyperplasia)    Colon cancer screening 05/18/2023   Gout    Hypercholesterolemia    Hyperparathyroidism (HCC)    Hypertension    Myocardial infarction (HCC) 06/2021   Neuromuscular disorder (HCC) 2021   Osteoporosis 2021   Pulmonary emboli (HCC)    Vitamin D deficiency    Vitamin D deficiency disease 10/07/2019    Past Surgical History: Past Surgical History:  Procedure Laterality Date   CORONARY STENT  INTERVENTION N/A 07/04/2021   Procedure: CORONARY STENT INTERVENTION;  Surgeon: Yvonne Kendall, MD;  Location: [facility] IN[facility];  Service: Cardiovascular;  Laterality: N/A;   CORONARY STENT INTERVENTION N/A 07/06/2021   Procedure: CORONARY STENT INTERVENTION;  Surgeon: Lennette Bihari, MD;  Location: [facility] INVAS[facility] CV [facility];  Service: Cardiovascular;  Laterality: N/A;   LEFT HEART CATH AND CORONARY ANGIOGRAPHY N/A 07/04/2021   Procedure: LEFT HEART CATH AND CORONARY ANGIOGRAPHY;  Surgeon: Yvonne Kendall, MD;  Location: [facility] CV [facility];  Service: Cardiovascular;  Laterality: N/A;   PERIPHERAL VASCULAR THROMBECTOMY     TRANSURETHRAL RESECTION OF PROSTATE     VASECTOMY      Family History: Family History  Problem Relation Age of Onset   Alzheimer's disease Mother        Vascular Dementia   Stroke Mother    Breast cancer Mother    Cancer Father        Multiple Myeloma   Hypertension Father    Deep vein thrombosis Brother    Heart disease Brother    Cancer Paternal Grandmother    Alcohol abuse Paternal Grandfather     Social History: Social History   Tobacco Use  Smoking Status Former   Current packs/day: 0.00   Average packs/day: 1 pack/day for 10.0 years (10.0 ttl pk-yrs)   Types: Cigarettes   Start date: 04/20/1961   Quit date: 04/21/1971   Years since quitting: 52.1  Smokeless Tobacco Never   Social History   Substance and Sexual Activity  Alcohol Use No   Comment: former   Social History   Substance and Sexual Activity  Drug Use Yes   Types: Marijuana  Comment: last use: 9/30    Allergies: Allergies  Allergen Reactions   Sulfa Antibiotics Hives   Sulfamethoxazole Rash    Medications: Current Outpatient Medications  Medication Sig Dispense Refill   cholecalciferol (VITAMIN D3) 25 MCG (1000 UT) tablet Take 1,000 Units by mouth daily.     cinacalcet (SENSIPAR) 30 MG tablet Take 1 tablet (30 mg total) by mouth daily with supper. 90 tablet 0    clopidogrel (PLAVIX) 75 MG tablet Take one (1) tablet by mouth (75 mg) daily. 90 tablet 3   inclisiran (LEQVIO) 284 MG/1.5ML SOSY injection Inject 1.5 mLs (284 mg total) into the skin. Every 6 months 1.5 mL 0   nitroGLYCERIN (NITROSTAT) 0.4 MG SL tablet Place 1 tablet (0.4 mg total) under the tongue every 5 (five) minutes x 3 doses as needed for chest pain. 25 tablet 2   rosuvastatin (CRESTOR) 5 MG tablet Take 1 tablet (5 mg total) by mouth daily. 90 tablet 3   tamsulosin (FLOMAX) 0.4 MG CAPS capsule Take 1 capsule (0.4 mg total) by mouth 2 (two) times daily. 180 capsule 3   venlafaxine XR (EFFEXOR-XR) 150 MG 24 hr capsule Take 1 capsule (150 mg total) by mouth daily. 90 capsule 3   No current facility-administered medications for this visit.    Review of Systems: GENERAL: negative for malaise, night sweats HEENT: No changes in hearing or vision, no nose bleeds or other nasal problems. NECK: Negative for lumps, goiter, pain and significant neck swelling RESPIRATORY: Negative for cough, wheezing CARDIOVASCULAR: Negative for chest pain, leg swelling, palpitations, orthopnea GI: SEE HPI MUSCULOSKELETAL: Negative for joint pain or swelling, back pain, and muscle pain. SKIN: Negative for lesions, rash PSYCH: Negative for sleep disturbance, mood disorder and recent psychosocial stressors. HEMATOLOGY Negative for prolonged bleeding, bruising easily, and swollen nodes. ENDOCRINE: Negative for cold or heat intolerance, polyuria, polydipsia and goiter. NEURO: negative for tremor, gait imbalance, syncope and seizures. The remainder of the review of systems is noncontributory.   Physical Exam: BP (!) 156/82 (BP Location: Left Arm, Patient Position: Sitting, Cuff Size: Normal)   Pulse 65   Temp (!) 97.1 F (36.2 C) (Tympanic)   Ht 5\' 9"  (1.753 m)   Wt 159 lb 1.6 oz (72.2 kg)   BMI 23.49 kg/m  GENERAL: The patient is AO x3, in no acute distress. HEENT: Head is normocephalic and atraumatic. EOMI  are intact. Mouth is well hydrated and without lesions. NECK: Supple. No masses LUNGS: Clear to auscultation. No presence of rhonchi/wheezing/rales. Adequate chest expansion HEART: RRR, normal s1 and s2. ABDOMEN: Soft, nontender, no guarding, no peritoneal signs, and nondistended. BS +. No masses. EXTREMITIES: Without any cyanosis, clubbing, rash, lesions or edema. NEUROLOGIC: AOx3, no focal motor deficit. SKIN: no jaundice, no rashes   Imaging/Labs: as above  I personally reviewed and interpreted the available labs, imaging and endoscopic files.  Impression and Plan: Billy Hunt is a 77 y.o. male with past medical history of coronary artery disease, BPH, gout, hyperlipidemia, hyperparathyroidism, hypertension, osteoporosis, who presents for evaluation of positive Cologuard.  Patient does not have any high risk factors for colorectal cancer malignancy and has been asymptomatic. Discussed cologuard test results in detail, specifically what it means when the test is positive or negative.  Discussed that there is a possibility that even when the test is positive there may not be a polyp found on colonoscopy. More than 50% of the office visit was dedicated to discussing the procedure, including the day of and  risks involved. Patient understands what the procedure involves including the benefits and any risks. Patient understands alternatives to the proposed procedure. Risks including (but not limited to) bleeding, tearing of the lining (perforation), rupture of adjacent organs, problems with heart and lung function, infection, and medication reactions. A small percentage of complications may require surgery, hospitalization, repeat endoscopic procedure, and/or transfusion. A small percentage of polyps and other tumors may not be seen.  - Schedule colonoscopy - will need to obtain clearance from cardiologist to stop Plavix 5 days before the procedure  All questions were answered.      Katrinka Blazing, MD Gastroenterology and Hepatology [facility]

## 2023-06-13 ENCOUNTER — Telehealth (INDEPENDENT_AMBULATORY_CARE_PROVIDER_SITE_OTHER): Payer: Self-pay | Admitting: Gastroenterology

## 2023-06-13 NOTE — Telephone Encounter (Addendum)
Good Morning Dr. Tenny Craw  We have received a surgical clearance request for colonoscopy.  Billy Hunt has a PMH NSTEMI 06/2021 with PCI/DES to occluded OM1 and then PCI/DES to LAD with staged PCI intervention with long DES (38 mm), HLD, HTN, DVT/PE. Can you please comment on guidance for  holding Plavix. Please forward you guidance and recommendations to P CV DIV PREOP   Thank you, Robin Searing, NP

## 2023-06-13 NOTE — Telephone Encounter (Signed)
06/13/23  Billy Hunt 1945/12/31  What type of surgery is being performed? Colonoscopy  When is surgery scheduled? TBD  Clearance by cardiology to hold Plavix for 5 days  Name of physician performing surgery?  Dr. Katrinka Blazing [facility] Phone: [phone] Fax: [phone]  Anethesia type (none, local, MAC, general)? MAC

## 2023-06-14 NOTE — Telephone Encounter (Signed)
Patient Name: Billy Hunt  DOB: 20-Jul-1946 MRN: [id number]  Primary Cardiologist: Dietrich Pates, MD  Chart reviewed as part of pre-operative protocol coverage. Given past medical history and time since last visit, based on ACC/AHA guidelines, PRANEETH HILBURN is at acceptable risk for the planned procedure without further cardiovascular testing.   Per Dr. Elease Hashimoto: "Pt was seen by Dr. Tenny Craw several months ago  He reported no angina and was doing very well  His last PCI was 2 years ago   He is at low risk for his colonoscopy  He may hold his Plavix for 5 days prior to his procedure"    The patient was advised that if he develops new symptoms prior to surgery to contact our office to arrange for a follow-up visit, and he verbalized understanding.  I will route this recommendation to the requesting party via Epic fax function and remove from pre-op pool.  Please call with questions.  Joni Reining, NP 06/14/2023, 8:21 AM

## 2023-07-02 DIAGNOSIS — Z23 Encounter for immunization: Secondary | ICD-10-CM | POA: Diagnosis not present

## 2023-07-04 ENCOUNTER — Telehealth (INDEPENDENT_AMBULATORY_CARE_PROVIDER_SITE_OTHER): Payer: Self-pay | Admitting: Gastroenterology

## 2023-07-04 MED ORDER — PEG 3350-KCL-NA BICARB-NACL 420 G PO SOLR: 4000.0000 mL | Freq: Once | ORAL | 0 refills | Status: AC

## 2023-07-04 NOTE — Telephone Encounter (Signed)
Pt called in and spoke with Dewayne Hatch. Pt needing to reschedule procedure from 07/25/23 to December. ( I was on the phone with another pt at this time) Returned call to pt but had to leave message

## 2023-07-04 NOTE — Telephone Encounter (Signed)
Pt returned call and reschedule to 08/15/23. Updated instructions sent to pt. Message sent to endo also

## 2023-07-04 NOTE — Addendum Note (Signed)
Addended by: Marlowe Shores on: 07/04/2023 08:19 AM   Modules accepted: Orders

## 2023-08-10 ENCOUNTER — Other Ambulatory Visit: Payer: Self-pay | Admitting: Internal Medicine

## 2023-08-12 IMAGING — CT CT ABD-PEL WO/W CM
5 of 16 series · 12 of 46 positions shown, 17 images · IV contrast (Omnipaque or Isovue)
Comparison: None.

CLINICAL DATA: Gross hematuria for 2 weeks, elevated PSA, history
of BPH and TURP

EXAM:
CT ABDOMEN AND PELVIS WITHOUT AND WITH CONTRAST
TECHNIQUE: Multidetector CT imaging of the abdomen and pelvis was performed
following the standard protocol before and following the bolus
administration of intravenous contrast.

[Series 3: axial pre · axial · non-contrast · 0.93mm/px · z∈[+1179,+1334]mm · 2 of 95 slices shown]
[im 32/95  soft-tissue]
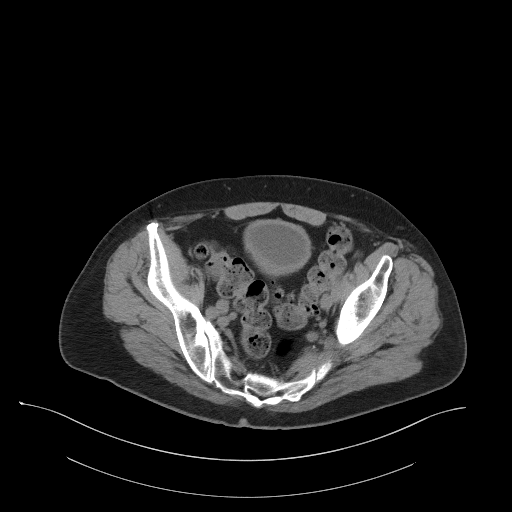
[im 63/95  soft-tissue]
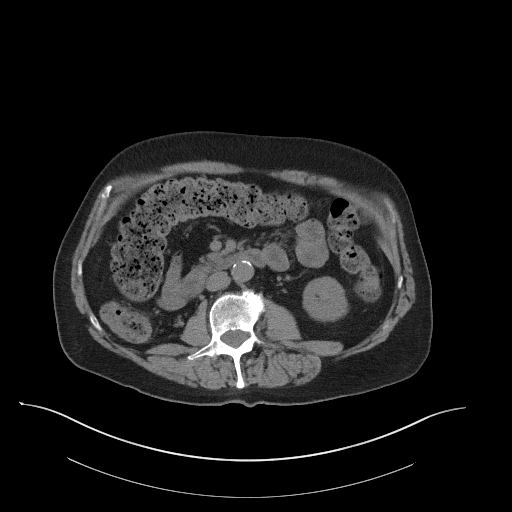

[Series 6: coronal pre · coronal · non-contrast · 0.76mm/px · 1 of 106 slices shown, 2 images]
[im 53/106  soft-tissue]
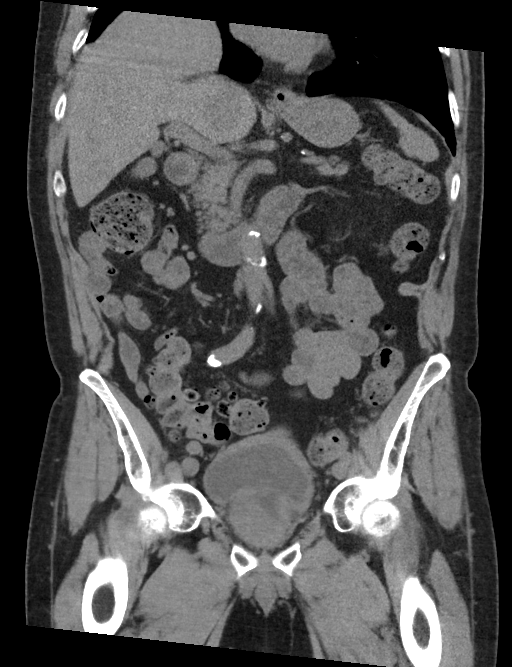
[im 53/106  bone]
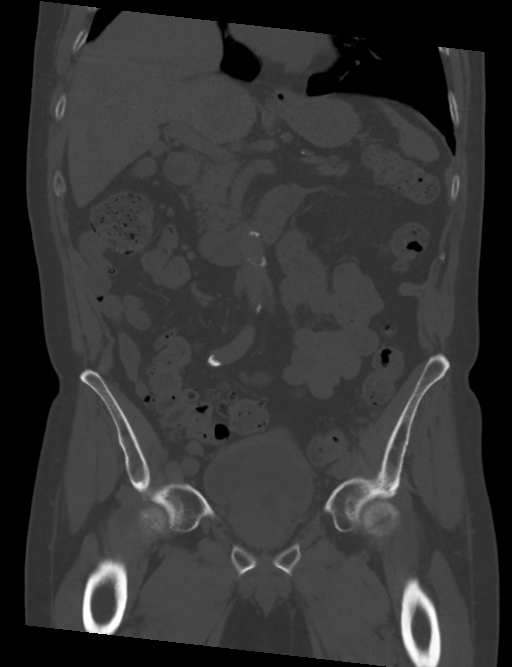

[Series 13: axial post · axial · 0.93mm/px · z∈[+1146,+1411]mm · 3 of 107 slices shown, 7 images]
[im 27/107  soft-tissue]
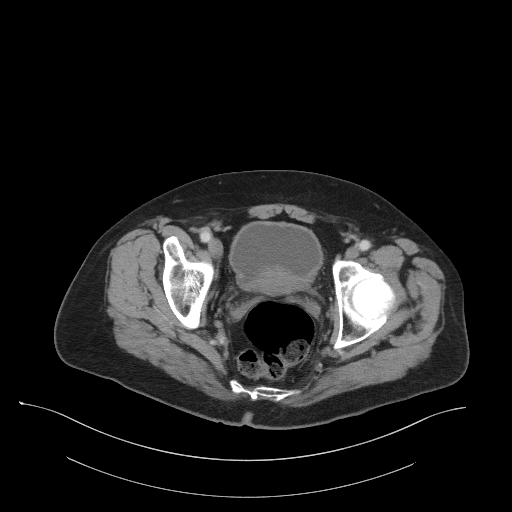
[im 27/107  lung]
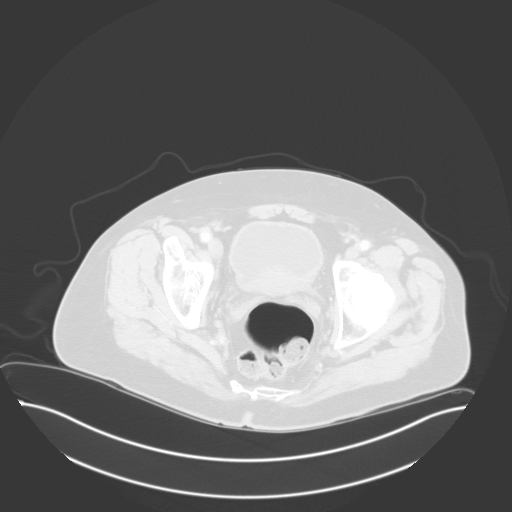
[im 27/107  bone]
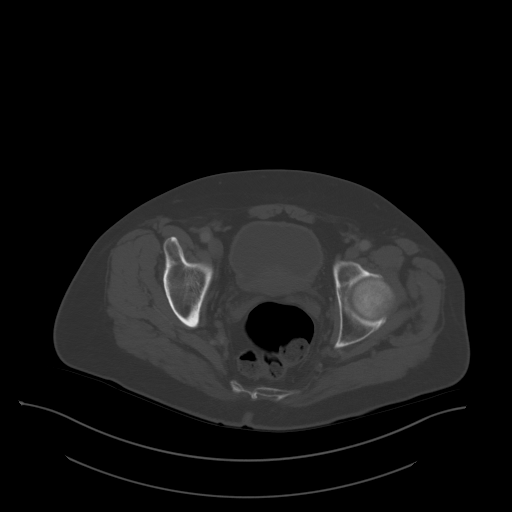
[im 54/107  soft-tissue]
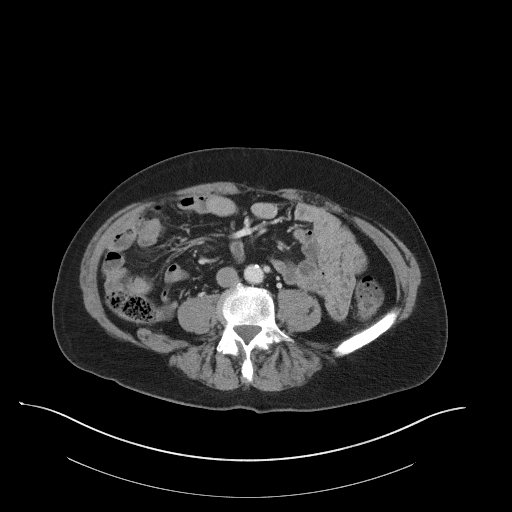
[im 54/107  lung]
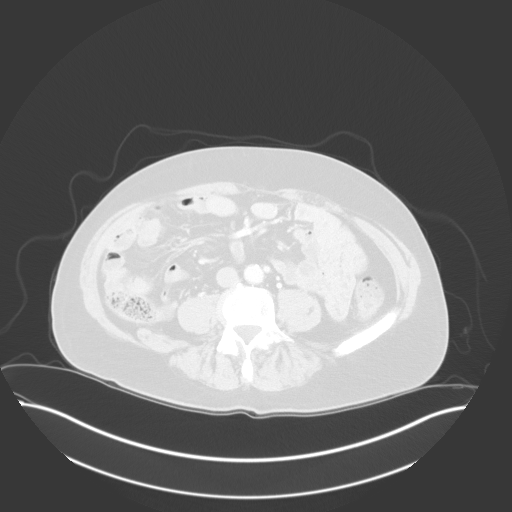
[im 80/107  soft-tissue]
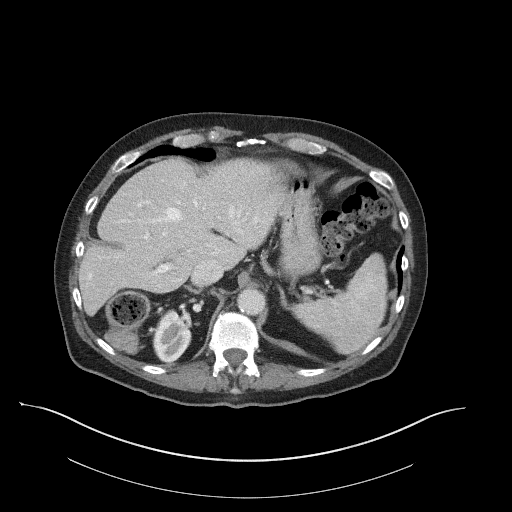
[im 80/107  lung]
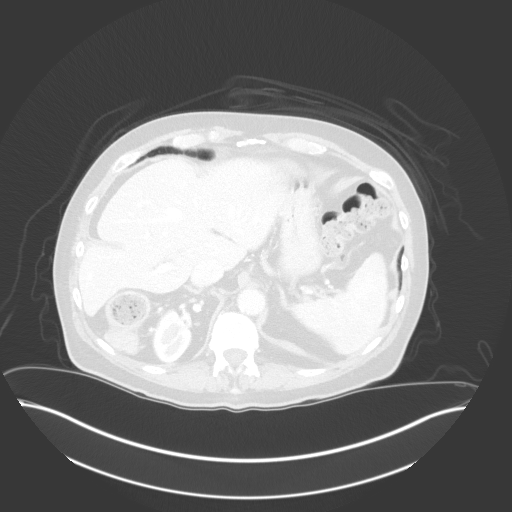

[Series 18: axial delay · axial · delayed · 0.90mm/px · z∈[+1238,+1498]mm · 3 of 104 slices shown]
[im 26/104  soft-tissue]
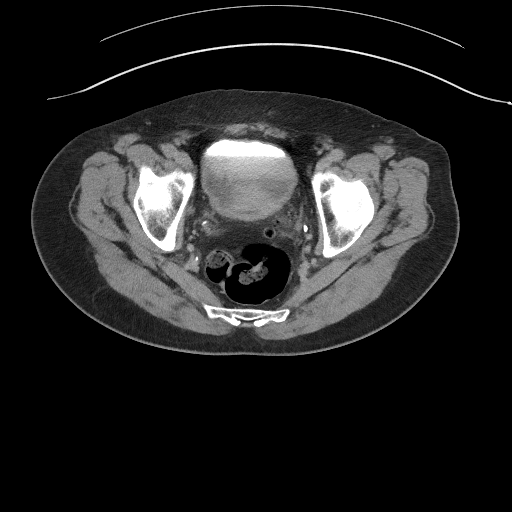
[im 52/104  soft-tissue]
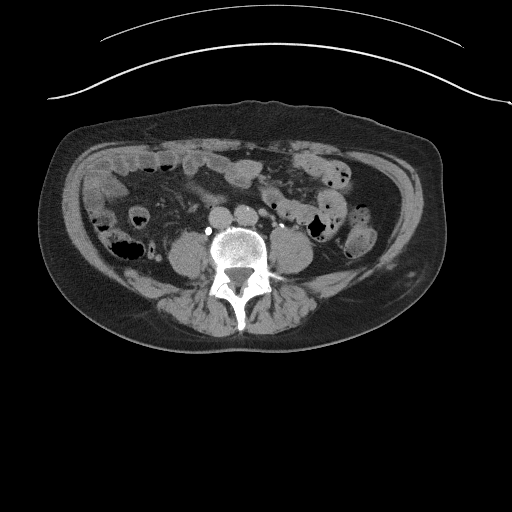
[im 78/104  soft-tissue]
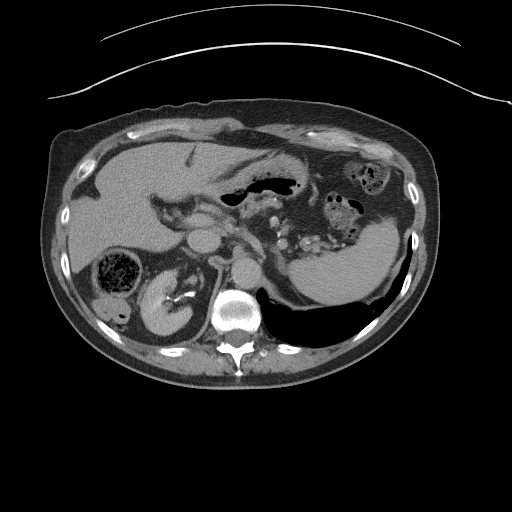

[Series 20: lung delay · axial · delayed · 0.90mm/px · z∈[+1460,+1572]mm · 3 of 113 slices shown]
[im 29/113  bone]
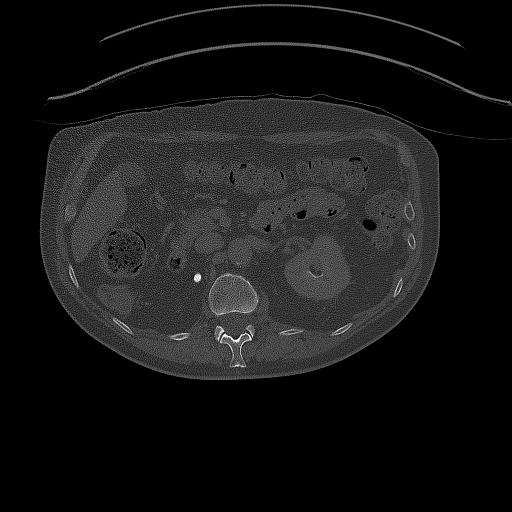
[im 57/113  bone]
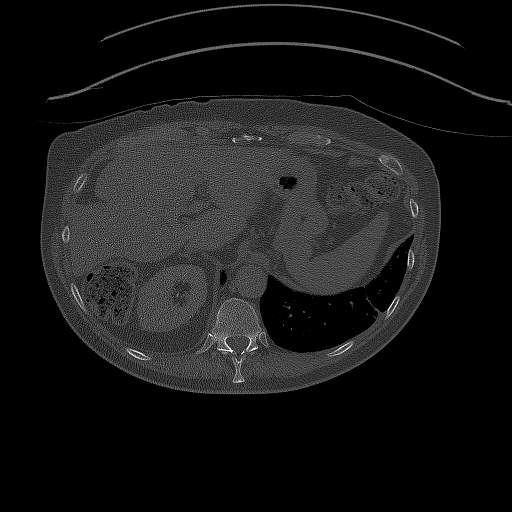
[im 85/113  bone]
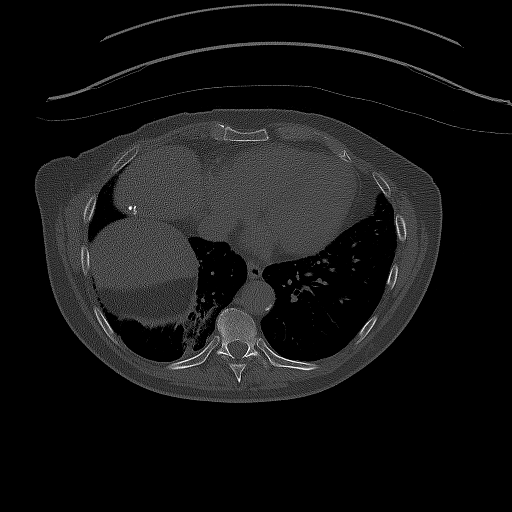

[12 of 46 positions shown; findings below may reference images not displayed]

RADIATION DOSE REDUCTION: This exam was performed according to the
departmental dose-optimization program which includes automated
exposure control, adjustment of the mA and/or kV according to
patient size and/or use of iterative reconstruction technique.

CONTRAST:  100mL OMNIPAQUE IOHEXOL 300 MG/ML  SOLN
FINDINGS: Lower chest: No acute abnormality. Eventration of the posterior
right hemidiaphragm with associated scarring and atelectasis.
Coronary artery calcifications. Small hiatal hernia.

Hepatobiliary: No solid liver abnormality is seen. No gallstones,
gallbladder wall thickening, or biliary dilatation.

Pancreas: Unremarkable. No pancreatic ductal dilatation or
surrounding inflammatory changes.

Spleen: Normal in size without significant abnormality.

Adrenals/Urinary Tract: Adrenal glands are unremarkable. The right
kidney is in a very high position due to eventration of the right
hemidiaphragm. Punctuate nonobstructive calculi of the superior pole
of the right kidney (series 3, image 6). No left-sided calculi,
ureteral calculi, or hydronephrosis. No suspicious mass or contrast
enhancement. Simple, benign cyst of the posterior midportion of the
left kidney, for which no further follow-up or characterization is
required. No urinary tract filling defect on delayed phase imaging.
Bladder is unremarkable.

Stomach/Bowel: Stomach is within normal limits. Appendix appears
normal. No evidence of bowel wall thickening, distention, or
inflammatory changes. Descending and sigmoid diverticulosis. Large
burden of stool throughout the colon and rectum.

Vascular/Lymphatic: Aortic atherosclerosis. No enlarged abdominal or
pelvic lymph nodes.

Reproductive: Severe prostatomegaly with TURP defect.

Other: No abdominal wall hernia or abnormality. No ascites.

Musculoskeletal: No acute or significant osseous findings.
IMPRESSION: 1. Punctuate nonobstructive calculi of the superior pole of the
right kidney. No left-sided calculi, ureteral calculi, or
hydronephrosis.
2. No suspicious mass or contrast enhancement. No urinary tract
filling defect on delayed phase imaging.
3. Severe prostatomegaly with TURP defect.
4. Descending and sigmoid diverticulosis without evidence of acute
diverticulitis.
5. Coronary artery disease.

Aortic Atherosclerosis ([id number]).

## 2023-08-13 ENCOUNTER — Telehealth (INDEPENDENT_AMBULATORY_CARE_PROVIDER_SITE_OTHER): Payer: Self-pay | Admitting: Otolaryngology

## 2023-08-13 NOTE — Telephone Encounter (Signed)
Elocon cream was called in with 5 refills at [facility] in [place]

## 2023-08-14 NOTE — Anesthesia Preprocedure Evaluation (Signed)
Anesthesia Evaluation  Patient identified by MRN, date of birth, ID band Patient awake    Reviewed: Allergy & Precautions, H&P , NPO status , Patient's Chart, lab work & pertinent test results, reviewed documented beta blocker date and time   Airway Mallampati: II  TM Distance: >3 FB Neck ROM: full    Dental no notable dental hx. (+) Dental Advisory Given, Teeth Intact   Pulmonary former smoker PE history   Pulmonary exam normal breath sounds clear to auscultation       Cardiovascular Exercise Tolerance: Good hypertension, + angina  + CAD, + Past MI and + Cardiac Stents  Normal cardiovascular exam Rhythm:regular Rate:Normal  Grade 1 diastolic dysfunction   Neuro/Psych  PSYCHIATRIC DISORDERS Anxiety      Neuromuscular disease    GI/Hepatic negative GI ROS, Neg liver ROS,,,  Endo/Other  hyperparathyroidism  Renal/GU negative Renal ROS  negative genitourinary   Musculoskeletal   Abdominal   Peds  Hematology negative hematology ROS (+)   Anesthesia Other Findings   Reproductive/Obstetrics negative OB ROS                             Anesthesia Physical Anesthesia Plan  ASA: 3  Anesthesia Plan: General   Post-op Pain Management: Minimal or no pain anticipated   Induction: Intravenous  PONV Risk Score and Plan: Propofol infusion  Airway Management Planned: Nasal Cannula and Natural Airway  Additional Equipment: None  Intra-op Plan:   Post-operative Plan:   Informed Consent: I have reviewed the patients History and Physical, chart, labs and discussed the procedure including the risks, benefits and alternatives for the proposed anesthesia with the patient or authorized representative who has indicated his/her understanding and acceptance.     Dental Advisory Given  Plan Discussed with: CRNA  Anesthesia Plan Comments:         Anesthesia Quick Evaluation

## 2023-08-15 ENCOUNTER — Ambulatory Visit (HOSPITAL_COMMUNITY)
Admission: RE | Admit: 2023-08-15 | Discharge: 2023-08-15 | Disposition: A | Discharge status: Home / Self Care | Payer: Medicare Other | Attending: Gastroenterology | Admitting: Gastroenterology

## 2023-08-15 ENCOUNTER — Encounter (HOSPITAL_COMMUNITY): Payer: Self-pay | Admitting: Gastroenterology

## 2023-08-15 ENCOUNTER — Ambulatory Visit (HOSPITAL_COMMUNITY): Payer: Medicare Other | Admitting: Anesthesiology

## 2023-08-15 ENCOUNTER — Other Ambulatory Visit: Payer: Self-pay

## 2023-08-15 ENCOUNTER — Ambulatory Visit (HOSPITAL_BASED_OUTPATIENT_CLINIC_OR_DEPARTMENT_OTHER): Payer: Medicare Other | Admitting: Anesthesiology

## 2023-08-15 ENCOUNTER — Encounter (HOSPITAL_COMMUNITY)
Admission: RE | Disposition: A | Discharge status: Home / Self Care | Payer: Self-pay | Source: Home / Self Care | Attending: Gastroenterology

## 2023-08-15 DIAGNOSIS — F419 Anxiety disorder, unspecified: Secondary | ICD-10-CM | POA: Diagnosis not present

## 2023-08-15 DIAGNOSIS — R195 Other fecal abnormalities: Secondary | ICD-10-CM | POA: Insufficient documentation

## 2023-08-15 DIAGNOSIS — N400 Enlarged prostate without lower urinary tract symptoms: Secondary | ICD-10-CM | POA: Diagnosis not present

## 2023-08-15 DIAGNOSIS — E785 Hyperlipidemia, unspecified: Secondary | ICD-10-CM | POA: Insufficient documentation

## 2023-08-15 DIAGNOSIS — E213 Hyperparathyroidism, unspecified: Secondary | ICD-10-CM | POA: Diagnosis not present

## 2023-08-15 DIAGNOSIS — E7800 Pure hypercholesterolemia, unspecified: Secondary | ICD-10-CM | POA: Diagnosis not present

## 2023-08-15 DIAGNOSIS — Z86711 Personal history of pulmonary embolism: Secondary | ICD-10-CM | POA: Diagnosis not present

## 2023-08-15 DIAGNOSIS — Z79899 Other long term (current) drug therapy: Secondary | ICD-10-CM | POA: Diagnosis not present

## 2023-08-15 DIAGNOSIS — K648 Other hemorrhoids: Secondary | ICD-10-CM | POA: Diagnosis not present

## 2023-08-15 DIAGNOSIS — M810 Age-related osteoporosis without current pathological fracture: Secondary | ICD-10-CM | POA: Insufficient documentation

## 2023-08-15 DIAGNOSIS — Z87891 Personal history of nicotine dependence: Secondary | ICD-10-CM | POA: Insufficient documentation

## 2023-08-15 DIAGNOSIS — Z955 Presence of coronary angioplasty implant and graft: Secondary | ICD-10-CM | POA: Diagnosis not present

## 2023-08-15 DIAGNOSIS — I252 Old myocardial infarction: Secondary | ICD-10-CM | POA: Insufficient documentation

## 2023-08-15 DIAGNOSIS — I10 Essential (primary) hypertension: Secondary | ICD-10-CM | POA: Diagnosis not present

## 2023-08-15 DIAGNOSIS — M109 Gout, unspecified: Secondary | ICD-10-CM | POA: Insufficient documentation

## 2023-08-15 DIAGNOSIS — K6389 Other specified diseases of intestine: Secondary | ICD-10-CM | POA: Diagnosis not present

## 2023-08-15 DIAGNOSIS — Z7902 Long term (current) use of antithrombotics/antiplatelets: Secondary | ICD-10-CM | POA: Diagnosis not present

## 2023-08-15 DIAGNOSIS — K5730 Diverticulosis of large intestine without perforation or abscess without bleeding: Secondary | ICD-10-CM | POA: Insufficient documentation

## 2023-08-15 DIAGNOSIS — I25119 Atherosclerotic heart disease of native coronary artery with unspecified angina pectoris: Secondary | ICD-10-CM | POA: Diagnosis not present

## 2023-08-15 DIAGNOSIS — I2510 Atherosclerotic heart disease of native coronary artery without angina pectoris: Secondary | ICD-10-CM | POA: Insufficient documentation

## 2023-08-15 DIAGNOSIS — Z1211 Encounter for screening for malignant neoplasm of colon: Secondary | ICD-10-CM | POA: Insufficient documentation

## 2023-08-15 HISTORY — PX: COLONOSCOPY WITH PROPOFOL: SHX5780

## 2023-08-15 LAB — HM COLONOSCOPY

## 2023-08-15 SURGERY — COLONOSCOPY WITH PROPOFOL
Anesthesia: General

## 2023-08-15 MED ORDER — EPHEDRINE 5 MG/ML INJ
INTRAVENOUS | Status: AC
  Filled 2023-08-15: qty 5

## 2023-08-15 MED ORDER — PHENYLEPHRINE 80 MCG/ML (10ML) SYRINGE FOR IV PUSH (FOR BLOOD PRESSURE SUPPORT)
PREFILLED_SYRINGE | INTRAVENOUS | Status: AC
  Filled 2023-08-15: qty 10

## 2023-08-15 MED ORDER — LIDOCAINE HCL (PF) 2 % IJ SOLN
INTRAMUSCULAR | Status: DC | PRN
  Administered 2023-08-15: 50 mg via INTRADERMAL

## 2023-08-15 MED ORDER — LACTATED RINGERS IV SOLN: INTRAVENOUS | Status: DC | PRN

## 2023-08-15 MED ORDER — PHENYLEPHRINE 80 MCG/ML (10ML) SYRINGE FOR IV PUSH (FOR BLOOD PRESSURE SUPPORT)
PREFILLED_SYRINGE | INTRAVENOUS | Status: DC | PRN
  Administered 2023-08-15 (×5): 160 ug via INTRAVENOUS

## 2023-08-15 MED ORDER — PROPOFOL 500 MG/50ML IV EMUL
INTRAVENOUS | Status: DC | PRN
Start: 2023-08-15 — End: 2023-08-15
  Administered 2023-08-15: 150 ug/kg/min via INTRAVENOUS

## 2023-08-15 MED ORDER — PROPOFOL 10 MG/ML IV BOLUS
INTRAVENOUS | Status: DC | PRN
  Administered 2023-08-15: 100 mg via INTRAVENOUS
  Administered 2023-08-15: 50 mg via INTRAVENOUS
  Administered 2023-08-15 (×2): 40 mg via INTRAVENOUS

## 2023-08-15 MED ORDER — EPHEDRINE SULFATE-NACL 50-0.9 MG/10ML-% IV SOSY
PREFILLED_SYRINGE | INTRAVENOUS | Status: DC | PRN
  Administered 2023-08-15 (×4): 5 mg via INTRAVENOUS

## 2023-08-15 NOTE — Anesthesia Procedure Notes (Signed)
Date/Time: 08/15/2023 7:30 AM  Performed by: Julian Reil, CRNAPre-anesthesia Checklist: Patient identified, Emergency Drugs available, Suction available and Patient being monitored Patient Re-evaluated:Patient Re-evaluated prior to induction Oxygen Delivery Method: Nasal cannula Induction Type: IV induction Placement Confirmation: positive ETCO2

## 2023-08-15 NOTE — Transfer of Care (Signed)
Immediate Anesthesia Transfer of Care Note  Patient: Billy Hunt  Procedure(s) Performed: COLONOSCOPY WITH PROPOFOL  Patient Location: Endoscopy Unit  Anesthesia Type:General  Level of Consciousness: awake  Airway & Oxygen Therapy: Patient Spontanous Breathing  Post-op Assessment: Report given to RN and Post -op Vital signs reviewed and stable  Post vital signs: Reviewed and stable  Last Vitals:  Vitals Value Taken Time  BP    Temp    Pulse    Resp    SpO2      Last Pain:  Vitals:   08/15/23 0730  TempSrc:   PainSc: 0-No pain      Patients Stated Pain Goal: 7 (08/15/23 1610)  Complications: No notable events documented.

## 2023-08-15 NOTE — Anesthesia Postprocedure Evaluation (Signed)
Anesthesia Post Note  Patient: Billy Hunt  Procedure(s) Performed: COLONOSCOPY WITH PROPOFOL  Patient location during evaluation: PACU Anesthesia Type: General Level of consciousness: awake and alert Pain management: pain level controlled Vital Signs Assessment: post-procedure vital signs reviewed and stable Respiratory status: spontaneous breathing, nonlabored ventilation, respiratory function stable and patient connected to nasal cannula oxygen Cardiovascular status: blood pressure returned to baseline and stable Postop Assessment: no apparent nausea or vomiting Anesthetic complications: no   There were no known notable events for this encounter.   Last Vitals:  Vitals:   08/15/23 0647 08/15/23 0825  BP: (!) 157/65 (!) 103/47  Pulse: 64 71  Resp: 20 (!) 23  Temp: 36.6 C 36.6 C  SpO2: 96% 95%    Last Pain:  Vitals:   08/15/23 0825  TempSrc: Oral  PainSc: 0-No pain                 Courtland Coppa L Viera Okonski

## 2023-08-15 NOTE — Op Note (Addendum)
[facility] Patient Name: Billy Hunt Procedure Date: 08/15/2023 7:09 AM MRN: [id number] Date of Birth: Feb 18, 1946 Attending MD: Katrinka Blazing , , [id number] CSN: [id number] Age: 77 Admit Type: Outpatient Procedure:                Colonoscopy Indications:              Positive Cologuard test Providers:                Katrinka Blazing, Edrick Kins, RN, Zena Amos Referring MD:              Medicines:                Monitored Anesthesia Care Complications:            No immediate complications. Estimated Blood Loss:     Estimated blood loss: none. Procedure:                Pre-Anesthesia Assessment:                           - Prior to the procedure, a History and Physical                            was performed, and patient medications, allergies                            and sensitivities were reviewed. The patient's                            tolerance of previous anesthesia was reviewed.                           - The risks and benefits of the procedure and the                            sedation options and risks were discussed with the                            patient. All questions were answered and informed                            consent was obtained.                           - ASA Grade Assessment: II - A patient with mild                            systemic disease.                           After obtaining informed consent, the colonoscope                            was passed under direct vision. Throughout the                            procedure, the patient's blood pressure, pulse,  and                            oxygen saturations were monitored continuously. The                            [id number]-[id number] ([id number]) was introduced through the                            anus and advanced to the the ileocecal valve. The                            [id number] scope was introduced through                            the anus and advanced to the  the cecum, identified                            by appendiceal orifice and ileocecal valve. The                            colonoscopy was performed with difficulty due to                            significant looping. Successful completion of the                            procedure was aided by changing scope to adult                            colonoscope. The patient tolerated the procedure                            well. The quality of the bowel preparation was good. Scope In: 7:34:43 AM Scope Out: 8:21:51 AM Scope Withdrawal Time: 0 hours 7 minutes 55 seconds  Total Procedure Duration: 0 hours 47 minutes 8 seconds  Findings:      The perianal and digital rectal examinations were normal.      The proximal ascending colon revealed significantly excessive looping.       Scope could not be advanced despite pressure and repositioning. I had to       switch to an adult colonoscope and use a forceps to depress the       ileocecal valve, which helped me reach the appendiceal orifice.      Scattered small-mouthed diverticula were found in the sigmoid colon,       descending colon and ascending colon.      Non-bleeding internal hemorrhoids were found during retroflexion. The       hemorrhoids were small. Impression:               - There was significant looping of the colon.                           - Diverticulosis in the sigmoid colon, in the  descending colon and in the ascending colon.                           - Non-bleeding internal hemorrhoids.                           - No specimens collected. Moderate Sedation:      Per Anesthesia Care Recommendation:           - Discharge patient to home (ambulatory).                           - Resume previous diet.                           - Repeat colonoscopy is not recommended due to                            current age (64 years or older) for screening                            purposes.                            - Restart Plavix today. Procedure Code(s):        --- Professional ---                           [phone], Colonoscopy, flexible; diagnostic, including                            collection of specimen(s) by brushing or washing,                            when performed (separate procedure) Diagnosis Code(s):        --- Professional ---                           K64.8, Other hemorrhoids                           R19.5, Other fecal abnormalities CPT copyright 2022 American Medical Association. All rights reserved. The codes documented in this report are preliminary and upon coder review may  be revised to meet current compliance requirements. Katrinka Blazing, MD Katrinka Blazing,  08/15/2023 8:28:50 AM This report has been signed electronically. Number of Addenda: 0

## 2023-08-15 NOTE — H&P (Signed)
Billy Hunt is an 77 y.o. male.   Chief Complaint: Positive Cologuard   History of Present Illness: Billy Hunt is a 77 y.o. male with past medical history of coronary artery disease, BPH, gout, hyperlipidemia, hyperparathyroidism, hypertension, osteoporosis, who presents for evaluation of positive Cologuard.   The patient denies having any complaints such as melena, hematochezia, abdominal pain or distention, change in her bowel movement consistency or frequency, no changes in weight recently.  No family history of colorectal cancer.   Past Medical History:  Diagnosis Date   Acute coronary syndrome (HCC) 07/04/2021   Allergy 1976   Anxiety    BPH (benign prostatic hyperplasia)    Colon cancer screening 05/18/2023   Gout    Hypercholesterolemia    Hyperparathyroidism (HCC)    Hypertension    Myocardial infarction (HCC) 06/2021   Neuromuscular disorder (HCC) 2021   Osteoporosis 2021   Pulmonary emboli (HCC)    Vitamin D deficiency    Vitamin D deficiency disease 10/07/2019    Past Surgical History:  Procedure Laterality Date   CORONARY STENT INTERVENTION N/A 07/04/2021   Procedure: CORONARY STENT INTERVENTION;  Surgeon: Yvonne Kendall, MD;  Location: [facility];  Service: Cardiovascular;  Laterality: N/A;   CORONARY STENT INTERVENTION N/A 07/06/2021   Procedure: CORONARY STENT INTERVENTION;  Surgeon: Lennette Bihari, MD;  Location: [facility];  Service: Cardiovascular;  Laterality: N/A;   LEFT HEART CATH AND CORONARY ANGIOGRAPHY N/A 07/04/2021   Procedure: LEFT HEART CATH AND CORONARY ANGIOGRAPHY;  Surgeon: Yvonne Kendall, MD;  Location: [facility];  Service: Cardiovascular;  Laterality: N/A;   PERIPHERAL VASCULAR THROMBECTOMY     TRANSURETHRAL RESECTION OF PROSTATE     VASECTOMY      Family History  Problem Relation Age of Onset   Alzheimer's disease Mother        Vascular Dementia   Stroke Mother    Breast cancer Mother    Cancer  Father        Multiple Myeloma   Hypertension Father    Deep vein thrombosis Brother    Heart disease Brother    Cancer Paternal Grandmother    Alcohol abuse Paternal Grandfather    Social History:  reports that he quit smoking about 52 years ago. His smoking use included cigarettes. He started smoking about 62 years ago. He has a 10 pack-year smoking history. He has never used smokeless tobacco. He reports current drug use. Frequency: 7.00 times per week. Drug: Marijuana. He reports that he does not drink alcohol.  Allergies:  Allergies  Allergen Reactions   Sulfa Antibiotics Hives   Sulfamethoxazole Rash    Medications Prior to Admission  Medication Sig Dispense Refill   cholecalciferol (VITAMIN D3) 25 MCG (1000 UT) tablet Take 1,000 Units by mouth daily.     rosuvastatin (CRESTOR) 5 MG tablet Take 1 tablet by mouth once daily 90 tablet 2   tamsulosin (FLOMAX) 0.4 MG CAPS capsule Take 1 capsule (0.4 mg total) by mouth 2 (two) times daily. 180 capsule 3   venlafaxine XR (EFFEXOR-XR) 150 MG 24 hr capsule Take 1 capsule (150 mg total) by mouth daily. 90 capsule 3   cinacalcet (SENSIPAR) 30 MG tablet TAKE 1 TABLET BY MOUTH TWICE DAILY WITH A MEAL 180 tablet 0   clopidogrel (PLAVIX) 75 MG tablet Take one (1) tablet by mouth (75 mg) daily. 90 tablet 3   inclisiran (LEQVIO) 284 MG/1.5ML SOSY injection Inject 1.5 mLs (284 mg  total) into the skin. Every 6 months 1.5 mL 0   nitroGLYCERIN (NITROSTAT) 0.4 MG SL tablet Place 1 tablet (0.4 mg total) under the tongue every 5 (five) minutes x 3 doses as needed for chest pain. 25 tablet 2    No results found for this or any previous visit (from the past 48 hour(s)). No results found.  Review of Systems  All other systems reviewed and are negative.   Blood pressure (!) 157/65, pulse 64, temperature 97.9 F (36.6 C), temperature source Oral, resp. rate 20, height 5\' 9"  (1.753 m), weight 70.3 kg, SpO2 96%. Physical Exam  GENERAL: The patient is  AO x3, in no acute distress. HEENT: Head is normocephalic and atraumatic. EOMI are intact. Mouth is well hydrated and without lesions. NECK: Supple. No masses LUNGS: Clear to auscultation. No presence of rhonchi/wheezing/rales. Adequate chest expansion HEART: RRR, normal s1 and s2. ABDOMEN: Soft, nontender, no guarding, no peritoneal signs, and nondistended. BS +. No masses. EXTREMITIES: Without any cyanosis, clubbing, rash, lesions or edema. NEUROLOGIC: AOx3, no focal motor deficit. SKIN: no jaundice, no rashes  Assessment/Plan Billy Hunt is a 77 y.o. male with past medical history of coronary artery disease, BPH, gout, hyperlipidemia, hyperparathyroidism, hypertension, osteoporosis, who presents for evaluation of positive Cologuard. We will proceed with colonoscopy  Dolores Frame, MD 08/15/2023, 7:28 AM

## 2023-08-15 NOTE — Discharge Instructions (Addendum)
You are being discharged to home.  Resume your previous diet.  Your physician has indicated that a repeat colonoscopy is not recommended due to your current age (47 years or older) for screening purposes.  Restart Plavix today.

## 2023-08-16 ENCOUNTER — Encounter (INDEPENDENT_AMBULATORY_CARE_PROVIDER_SITE_OTHER): Payer: Self-pay | Admitting: *Deleted

## 2023-08-22 ENCOUNTER — Ambulatory Visit (INDEPENDENT_AMBULATORY_CARE_PROVIDER_SITE_OTHER): Payer: Medicare Other

## 2023-08-22 ENCOUNTER — Ambulatory Visit (INDEPENDENT_AMBULATORY_CARE_PROVIDER_SITE_OTHER): Payer: Medicare Other | Admitting: Audiology

## 2023-08-22 ENCOUNTER — Encounter (INDEPENDENT_AMBULATORY_CARE_PROVIDER_SITE_OTHER): Payer: Self-pay

## 2023-08-22 VITALS — Ht 69.0 in | Wt 155.0 lb

## 2023-08-22 DIAGNOSIS — H903 Sensorineural hearing loss, bilateral: Secondary | ICD-10-CM

## 2023-08-22 DIAGNOSIS — H6123 Impacted cerumen, bilateral: Secondary | ICD-10-CM | POA: Diagnosis not present

## 2023-08-22 NOTE — Progress Notes (Signed)
Patient ID: Billy Hunt, male   DOB: 02-Dec-1945, 77 y.o.   MRN: [id number]  Follow up: Hearing loss  HPI: The patient is a 77 year old male who returns today for his follow-up evaluation.  He was previously seen for asymmetric hearing loss, worse on the left side.  His MRI scan was negative for retrocochlear lesion.  He currently wears bilateral hearing aids.  Currently he denies any otalgia, otorrhea, or vertigo.  Exam: General: Communicates without difficulty, well nourished, no acute distress. Head: Normocephalic, no evidence injury, no tenderness, facial buttresses intact without stepoff. Face/sinus: No tenderness to palpation and percussion. Facial movement is normal and symmetric. Eyes: PERRL, EOMI. No scleral icterus, conjunctivae clear. Neuro: CN II exam reveals vision grossly intact.  No nystagmus at any point of gaze. EAC: Bilateral cerumen impaction.  Under the operating microscope, the cerumen is carefully removed with a combination of cerumen currette, alligator forceps, and suction catheters.  After the cerumen is removed, the TMs are noted to be normal. Nose: External evaluation reveals normal support and skin without lesions.  Dorsum is intact.  Anterior rhinoscopy reveals pink mucosa over anterior aspect of inferior turbinates and intact septum.  No purulence noted. Oral:  Oral cavity and oropharynx are intact, symmetric, without erythema or edema.  Mucosa is moist without lesions. Neck: Full range of motion without pain.  There is no significant lymphadenopathy.  No masses palpable.  Thyroid bed within normal limits to palpation.  Parotid glands and submandibular glands equal bilaterally without mass.  Trachea is midline. Neuro:  CN 2-12 grossly intact.   Procedure: Bilateral cerumen disimpaction Anesthesia: None Description: Under the operating microscope, the cerumen is carefully removed with a combination of cerumen currette, alligator forceps, and suction catheters.  After the  cerumen is removed, the TMs are noted to be normal.  No mass, erythema, or lesions. The patient tolerated the procedure well.   AUDIOMETRIC TESTING: I have read and reviewed the audiometric test, which shows stable bilateral high-frequency sensorineural hearing loss.   Assessment  1.  Bilateral cerumen impaction. The patient's ear canals, tympanic membranes and middle ear spaces are otherwise normal.  2.  Stable bilateral high-frequency sensorineural hearing loss is noted with minimal asymmetry.  3.  Previous brain MRI showed no evidence of intracranial lesion.   Plan: 1. Otomicroscopy with cerumen disimpaction.  2. The hearing test findings are reviewed with the patient.  3. Continue the use of his hearing aids. 4. The patient will follow up for re-evaluation in 12 months.

## 2023-08-22 NOTE — Progress Notes (Signed)
  [address] [place], [place] [address] [phone]  Audiological Evaluation    Name: Billy Hunt     DOB:   1945-11-27      MRN:   [id number]                                                                                     Service Date: 08/22/2023        Patient was referred today for a hearing evaluation by Dr. Karle Barr.   Symptoms Yes Details  Hearing loss  [x]  Patient reported perceiving hearing loss.  Tinnitus  []  Patient denied experiencing tinnitus.  Balance problems  []  Patient denied vertigo/imbalance sensations.  Previous ear surgeries  []  Patient denied any previous ear surgeries.  Family history  []  Patient denied family history of hearing loss.  Amplification  [x]  Patient reported the use of bilateral hearing aids.    Tympanogram: Right ear: Normal external ear canal volume with normal middle ear pressure and high tympanic membrane compliance (Type Ad). Left ear: Normal external ear canal volume with normal middle ear pressure and tympanic membrane compliance (Type A).    Hearing Evaluation: The audiogram was completed using conventional audiometric techniques under headphones with good reliability.   The hearing test results indicate: Right ear: Normal hearing sensitivity from 250-500 Hz gradually sloping to moderately-severe sensorineural hearing loss from 1000-8000 Hz. Left ear: Normal hearing sensitivity from 250-500 Hz gradually sloping to severe sensorineural hearing loss from 1000-8000 Hz.   Speech Audiometry: Right ear- Speech Reception Threshold (SRT) was obtained at 45 dBHL. Left ear- Speech Reception Threshold (SRT) was obtained at 50 dBHL masked.   Word Recognition Score Tested using NU-6 (MLV) Right ear: 80% was obtained at a presentation level of 75 dBHL which is deemed as good understanding. Left ear: 68% was obtained at a presentation level of 75 dBHL with contralateral masking which is deemed as fair  understanding.    Recommendations: Repeat audiogram when changes are perceived or per MD. Continue use of hearing aids.   Conley Rolls Zariel Capano, AUD, CCC-A 08/22/23

## 2023-08-23 ENCOUNTER — Encounter (HOSPITAL_COMMUNITY): Payer: Self-pay | Admitting: Gastroenterology

## 2023-09-07 ENCOUNTER — Ambulatory Visit: Payer: Medicare Other | Admitting: Internal Medicine

## 2023-09-09 ENCOUNTER — Other Ambulatory Visit: Payer: Self-pay | Admitting: "Endocrinology

## 2023-09-13 DIAGNOSIS — H2513 Age-related nuclear cataract, bilateral: Secondary | ICD-10-CM | POA: Diagnosis not present

## 2023-09-13 DIAGNOSIS — H52223 Regular astigmatism, bilateral: Secondary | ICD-10-CM | POA: Diagnosis not present

## 2023-09-13 DIAGNOSIS — H524 Presbyopia: Secondary | ICD-10-CM | POA: Diagnosis not present

## 2023-09-13 DIAGNOSIS — H5203 Hypermetropia, bilateral: Secondary | ICD-10-CM | POA: Diagnosis not present

## 2023-09-18 ENCOUNTER — Other Ambulatory Visit: Payer: Self-pay | Admitting: Physician Assistant

## 2023-09-20 ENCOUNTER — Telehealth: Payer: Self-pay | Admitting: Pharmacy Technician

## 2023-09-20 NOTE — Telephone Encounter (Signed)
 Auth Submission: NO AUTH NEEDED Payer: MEDICARE A/B & AARP SUPP Medication & CPT/J Code(s) submitted: Leqvio  (Inclisiran) J1306 Route of submission (phone, fax, portal):  Phone # Fax # Auth type: Buy/Bill Units/visits requested: every 6 months Reference number: AARP Ref: [id number] Approval from: 08/01/22 to 09/10/24

## 2023-10-09 ENCOUNTER — Ambulatory Visit (INDEPENDENT_AMBULATORY_CARE_PROVIDER_SITE_OTHER): Payer: Medicare Other

## 2023-10-09 VITALS — BP 122/60 | Ht 69.0 in | Wt 155.0 lb

## 2023-10-09 DIAGNOSIS — Z0000 Encounter for general adult medical examination without abnormal findings: Secondary | ICD-10-CM | POA: Diagnosis not present

## 2023-10-09 NOTE — Patient Instructions (Signed)
Billy Hunt , Thank you for taking time to come for your Medicare Wellness Visit. I appreciate your ongoing commitment to your health goals. Please review the following plan we discussed and let me know if I can assist you in the future.   Referrals/Orders/Follow-Ups/Clinician Recommendations:  Next Medicare Annual Wellness Visit:  October 13, 2024 at 1:10 pm virtual visit  This is a list of the screening recommended for you and due dates:  Health Maintenance  Topic Date Due   COVID-19 Vaccine (5 - 2024-25 season) 05/13/2023   Medicare Annual Wellness Visit  10/08/2024   DTaP/Tdap/Td vaccine (3 - Td or Tdap) 11/18/2031   Pneumonia Vaccine  Completed   Flu Shot  Completed   Hepatitis C Screening  Completed   Zoster (Shingles) Vaccine  Completed   HPV Vaccine  Aged Out   Colon Cancer Screening  Discontinued    Advanced directives: (In Chart) A copy of your advanced directives are scanned into your chart should your provider ever need it.  Next Medicare Annual Wellness Visit scheduled for next year: yes  Preventive Care 68 Years and Older, Male Preventive care refers to lifestyle choices and visits with your health care provider that can promote health and wellness. Preventive care visits are also called wellness exams. What can I expect for my preventive care visit? Counseling During your preventive care visit, your health care provider may ask about your: Medical history, including: Past medical problems. Family medical history. History of falls. Current health, including: Emotional well-being. Home life and relationship well-being. Sexual activity. Memory and ability to understand (cognition). Lifestyle, including: Alcohol, nicotine or tobacco, and drug use. Access to firearms. Diet, exercise, and sleep habits. Work and work Astronomer. Sunscreen use. Safety issues such as seatbelt and bike helmet use. Physical exam Your health care provider will check your: Height and  weight. These may be used to calculate your BMI (body mass index). BMI is a measurement that tells if you are at a healthy weight. Waist circumference. This measures the distance around your waistline. This measurement also tells if you are at a healthy weight and may help predict your risk of certain diseases, such as type 2 diabetes and high blood pressure. Heart rate and blood pressure. Body temperature. Skin for abnormal spots. What immunizations do I need?  Vaccines are usually given at various ages, according to a schedule. Your health care provider will recommend vaccines for you based on your age, medical history, and lifestyle or other factors, such as travel or where you work. What tests do I need? Screening Your health care provider may recommend screening tests for certain conditions. This may include: Lipid and cholesterol levels. Diabetes screening. This is done by checking your blood sugar (glucose) after you have not eaten for a while (fasting). Hepatitis C test. Hepatitis B test. HIV (human immunodeficiency virus) test. STI (sexually transmitted infection) testing, if you are at risk. Lung cancer screening. Colorectal cancer screening. Prostate cancer screening. Abdominal aortic aneurysm (AAA) screening. You may need this if you are a current or former smoker. Talk with your health care provider about your test results, treatment options, and if necessary, the need for more tests. Follow these instructions at home: Eating and drinking  Eat a diet that includes fresh fruits and vegetables, whole grains, lean protein, and low-fat dairy products. Limit your intake of foods with high amounts of sugar, saturated fats, and salt. Take vitamin and mineral supplements as recommended by your health care provider. Do not  drink alcohol if your health care provider tells you not to drink. If you drink alcohol: Limit how much you have to 0-2 drinks a day. Know how much alcohol is in  your drink. In the [place] one drink equals one 12 oz bottle of beer (355 mL), one 5 oz glass of wine (148 mL), or one 1 oz glass of hard liquor (44 mL). Lifestyle Brush your teeth every morning and night with fluoride toothpaste. Floss one time each day. Exercise for at least 30 minutes 5 or more days each week. Do not use any products that contain nicotine or tobacco. These products include cigarettes, chewing tobacco, and vaping devices, such as e-cigarettes. If you need help quitting, ask your health care provider. Do not use drugs. If you are sexually active, practice safe sex. Use a condom or other form of protection to prevent STIs. Take aspirin only as told by your health care provider. Make sure that you understand how much to take and what form to take. Work with your health care provider to find out whether it is safe and beneficial for you to take aspirin daily. Ask your health care provider if you need to take a cholesterol-lowering medicine (statin). Find healthy ways to manage stress, such as: Meditation, yoga, or listening to music. Journaling. Talking to a trusted person. Spending time with friends and family. Safety Always wear your seat belt while driving or riding in a vehicle. Do not drive: If you have been drinking alcohol. Do not ride with someone who has been drinking. When you are tired or distracted. While texting. If you have been using any mind-altering substances or drugs. Wear a helmet and other protective equipment during sports activities. If you have firearms in your house, make sure you follow all gun safety procedures. Minimize exposure to UV radiation to reduce your risk of skin cancer. What's next? Visit your health care provider once a year for an annual wellness visit. Ask your health care provider how often you should have your eyes and teeth checked. Stay up to date on all vaccines. This information is not intended to replace advice given to you by  your health care provider. Make sure you discuss any questions you have with your health care provider. Document Revised: 02/23/2021 Document Reviewed: 02/23/2021 Elsevier Patient Education  2024 [employer].  Understanding Your Risk for Falls Millions of people have serious injuries from falls each year. It is important to understand your risk of falling. Talk with your health care provider about your risk and what you can do to lower it. If you do have a serious fall, make sure to tell your provider. Falling once raises your risk of falling again. How can falls affect me? Serious injuries from falls are common. These include: Broken bones, such as hip fractures. Head injuries, such as traumatic brain injuries (TBI) or concussions. A fear of falling can cause you to avoid activities and stay at home. This can make your muscles weaker and raise your risk for a fall. What can increase my risk? There are a number of risk factors that increase your risk for falling. The more risk factors you have, the higher your risk of falling. Serious injuries from a fall happen most often to people who are older than 76 years old. Teenagers and young adults ages 50-29 are also at higher risk. Common risk factors include: Weakness in the lower body. Being generally weak or confused due to long-term (chronic) illness. Dizziness or balance  problems. Poor vision. Medicines that cause dizziness or drowsiness. These may include: Medicines for your blood pressure, heart, anxiety, insomnia, or swelling (edema). Pain medicines. Muscle relaxants. Other risk factors include: Drinking alcohol. Having had a fall in the past. Having foot pain or wearing improper footwear. Working at a dangerous job. Having any of the following in your home: Tripping hazards, such as floor clutter or loose rugs. Poor lighting. Pets. Having dementia or memory loss. What actions can I take to lower my risk of falling?      Physical activity Stay physically fit. Do strength and balance exercises. Consider taking a regular class to build strength and balance. Yoga and tai chi are good options. Vision Have your eyes checked every year and your prescription for glasses or contacts updated as needed. Shoes and walking aids Wear non-skid shoes. Wear shoes that have rubber soles and low heels. Do not wear high heels. Do not walk around the house in socks or slippers. Use a cane or walker as told by your provider. Home safety Attach secure railings on both sides of your stairs. Install grab bars for your bathtub, shower, and toilet. Use a non-skid mat in your bathtub or shower. Attach bath mats securely with double-sided, non-slip rug tape. Use good lighting in all rooms. Keep a flashlight near your bed. Make sure there is a clear path from your bed to the bathroom. Use night-lights. Do not use throw rugs. Make sure all carpeting is taped or tacked down securely. Remove all clutter from walkways and stairways, including extension cords. Repair uneven or broken steps and floors. Avoid walking on icy or slippery surfaces. Walk on the grass instead of on icy or slick sidewalks. Use ice melter to get rid of ice on walkways in the winter. Use a cordless phone. Questions to ask your health care provider Can you help me check my risk for a fall? Do any of my medicines make me more likely to fall? Should I take a vitamin D supplement? What exercises can I do to improve my strength and balance? Should I make an appointment to have my vision checked? Do I need a bone density test to check for weak bones (osteoporosis)? Would it help to use a cane or a walker? Where to find more information Centers for Disease Control and Prevention, [employer]: TonerPromos.no Community-Based Fall Prevention Programs: TonerPromos.no [employer] on Aging: BaseRingTones.pl Contact a health care provider if: You fall at home. You are afraid of falling  at home. You feel weak, drowsy, or dizzy. This information is not intended to replace advice given to you by your health care provider. Make sure you discuss any questions you have with your health care provider. Document Revised: 05/01/2022 Document Reviewed: 05/01/2022 Elsevier Patient Education  2024 [employer].

## 2023-10-09 NOTE — Progress Notes (Signed)
Because this visit was a virtual/telehealth visit,  certain criteria was not obtained, such a blood pressure, CBG if applicable, and timed get up and go. Any medications not marked as "taking" were not mentioned during the medication reconciliation part of the visit. Any vitals not documented were not able to be obtained due to this being a telehealth visit or patient was unable to self-report a recent blood pressure reading due to a lack of equipment at home via telehealth. Vitals that have been documented are verbally provided by the patient.   Subjective:   Billy Hunt is a 78 y.o. male who presents for Medicare Annual/Subsequent preventive examination.  Visit Complete: Virtual I connected with  Billy Hunt on 10/09/23 by a video and audio enabled telemedicine application and verified that I am speaking with the correct person using two identifiers.  Patient Location: Home  Provider Location: Home Office  I discussed the limitations of evaluation and management by telemedicine. The patient expressed understanding and agreed to proceed.  Vital Signs: Because this visit was a virtual/telehealth visit, some criteria may be missing or patient reported. Any vitals not documented were not able to be obtained and vitals that have been documented are patient reported.  Patient Medicare AWV questionnaire was completed by the patient on 10/07/2023; I have confirmed that all information answered by patient is correct and no changes since this date.  Cardiac Risk Factors include: advanced age (>52men, >53 women);dyslipidemia;hypertension;male gender     Objective:    Today's Vitals   10/09/23 1349  BP: 122/60  Weight: 155 lb (70.3 kg)  Height: 5\' 9"  (1.753 m)   Body mass index is 22.89 kg/m.     10/09/2023    1:52 PM 08/15/2023    6:36 AM 09/20/2021    8:41 AM 07/05/2021    5:00 PM 08/16/2020   11:15 AM 08/04/2019    3:00 PM 06/12/2015    8:45 AM  Advanced Directives  Does  Patient Have a Medical Advance Directive? Yes Yes No;Yes Yes Yes Yes Yes  Type of Estate agent of [place];Living will Living will Living will Healthcare Power of [place];Living will Living will;Healthcare Power of Attorney Out of facility DNR (pink MOST or yellow form)   Does patient want to make changes to medical advance directive? No - Patient declined   No - Patient declined No - Patient declined No - Patient declined   Copy of Healthcare Power of Attorney in Chart? Yes - validated most recent copy scanned in chart (See row information)   No - copy requested No - copy requested  No - copy requested  Would patient like information on creating a medical advance directive?   No - Patient declined        Current Medications (verified) Outpatient Encounter Medications as of 10/09/2023  Medication Sig   cholecalciferol (VITAMIN D3) 25 MCG (1000 UT) tablet Take 1,000 Units by mouth daily.   cinacalcet (SENSIPAR) 30 MG tablet Take 1 tablet (30 mg total) by mouth daily with supper.   clopidogrel (PLAVIX) 75 MG tablet Take 1 tablet by mouth once daily   inclisiran (LEQVIO) 284 MG/1.5ML SOSY injection Inject 1.5 mLs (284 mg total) into the skin. Every 6 months   nitroGLYCERIN (NITROSTAT) 0.4 MG SL tablet Place 1 tablet (0.4 mg total) under the tongue every 5 (five) minutes x 3 doses as needed for chest pain.   rosuvastatin (CRESTOR) 5 MG tablet Take 1 tablet by mouth once daily  tamsulosin (FLOMAX) 0.4 MG CAPS capsule Take 1 capsule (0.4 mg total) by mouth 2 (two) times daily.   venlafaxine XR (EFFEXOR-XR) 150 MG 24 hr capsule Take 1 capsule (150 mg total) by mouth daily.   No facility-administered encounter medications on file as of 10/09/2023.    Allergies (verified) Sulfa antibiotics and Sulfamethoxazole   History: Past Medical History:  Diagnosis Date   Acute coronary syndrome (HCC) 07/04/2021   Allergy 1976   Anxiety    BPH (benign prostatic hyperplasia)     Cataract 10/14/2023   Colon cancer screening 05/18/2023   Gout    Hypercholesterolemia    Hyperparathyroidism (HCC)    Hypertension    Myocardial infarction (HCC) 06/2021   Neuromuscular disorder (HCC) 2021   Osteoporosis 2021   Pulmonary emboli (HCC)    Vitamin D deficiency    Vitamin D deficiency disease 10/07/2019   Past Surgical History:  Procedure Laterality Date   COLONOSCOPY WITH PROPOFOL N/A 08/15/2023   Procedure: COLONOSCOPY WITH PROPOFOL;  Surgeon: Dolores Frame, MD;  Location: [facility];  Service: Gastroenterology;  Laterality: N/A;  8:15AM;ASA 1   CORONARY STENT INTERVENTION N/A 07/04/2021   Procedure: CORONARY STENT INTERVENTION;  Surgeon: Yvonne Kendall, MD;  Location: [facility];  Service: Cardiovascular;  Laterality: N/A;   CORONARY STENT INTERVENTION N/A 07/06/2021   Procedure: CORONARY STENT INTERVENTION;  Surgeon: Lennette Bihari, MD;  Location: [facility];  Service: Cardiovascular;  Laterality: N/A;   LEFT HEART CATH AND CORONARY ANGIOGRAPHY N/A 07/04/2021   Procedure: LEFT HEART CATH AND CORONARY ANGIOGRAPHY;  Surgeon: Yvonne Kendall, MD;  Location: [facility];  Service: Cardiovascular;  Laterality: N/A;   PERIPHERAL VASCULAR THROMBECTOMY     TRANSURETHRAL RESECTION OF PROSTATE     VASECTOMY     Family History  Problem Relation Age of Onset   Alzheimer's disease Mother        Vascular Dementia   Stroke Mother    Breast cancer Mother    Cancer Father        Multiple Myeloma   Hypertension Father    Deep vein thrombosis Brother    Heart disease Brother    Cancer Paternal Grandmother    Alcohol abuse Paternal Grandfather    Social History   Socioeconomic History   Marital status: Divorced    Spouse name: Not on file   Number of children: 2   Years of education: 12   Highest education level: Manufacturing engineer (e.g., MA, MS, MEng, MEd, MSW, MBA)  Occupational History   Not on file  Tobacco Use   Smoking  status: Former    Current packs/day: 0.00    Average packs/day: 1 pack/day for 10.0 years (10.0 ttl pk-yrs)    Types: Cigarettes    Start date: 04/20/1961    Quit date: 04/21/1971    Years since quitting: 52.5   Smokeless tobacco: Never  Vaping Use   Vaping status: Never Used  Substance and Sexual Activity   Alcohol use: No    Comment: former   Drug use: Yes    Frequency: 7.0 times per week    Types: Marijuana    Comment: daily, last use 08/13/2023   Sexual activity: Yes    Birth control/protection: Other-see comments  Other Topics Concern   Not on file  Social History Narrative   Not on file   Social Drivers of Health   Financial Resource Strain: Low Risk  (10/09/2023)   Overall Financial  Resource Strain (CARDIA)    Difficulty of Paying Living Expenses: Not hard at all  Food Insecurity: No Food Insecurity (10/09/2023)   Hunger Vital Sign    Worried About Running Out of Food in the Last Year: Never true    Ran Out of Food in the Last Year: Never true  Transportation Needs: No Transportation Needs (10/09/2023)   PRAPARE - Administrator, Civil Service (Medical): No    Lack of Transportation (Non-Medical): No  Physical Activity: Insufficiently Active (10/09/2023)   Exercise Vital Sign    Days of Exercise per Week: 3 days    Minutes of Exercise per Session: 40 min  Stress: No Stress Concern Present (10/09/2023)   [employer] of Occupational Health - Occupational Stress Questionnaire    Feeling of Stress : Not at all  Social Connections: Moderately Isolated (10/09/2023)   Social Connection and Isolation Panel [NHANES]    Frequency of Communication with Friends and Family: More than three times a week    Frequency of Social Gatherings with Friends and Family: Once a week    Attends Religious Services: Never    Database administrator or Organizations: No    Attends Engineer, structural: Never    Marital Status: Living with partner    Tobacco  Counseling Counseling given: Yes   Clinical Intake:  Pre-visit preparation completed: Yes  Pain : No/denies pain     BMI - recorded: 22.89 Nutritional Status: BMI of 19-24  Normal Nutritional Risks: None Diabetes: No  How often do you need to have someone help you when you read instructions, pamphlets, or other written materials from your doctor or pharmacy?: 1 - Never  Interpreter Needed?: No  Information entered by :: Maryjean Ka CMA   Activities of Daily Living    10/07/2023    7:09 PM  In your present state of health, do you have any difficulty performing the following activities:  Hearing? 0  Vision? 0  Difficulty concentrating or making decisions? 0  Walking or climbing stairs? 0  Dressing or bathing? 0  Doing errands, shopping? 0  Preparing Food and eating ? N  Using the Toilet? N  In the past six months, have you accidently leaked urine? N  Do you have problems with loss of bowel control? N  Managing your Medications? N  Managing your Finances? N  Housekeeping or managing your Housekeeping? N    Patient Care Team: Anabel Halon, MD as PCP - General (Internal Medicine) Pricilla Riffle, MD as PCP - Cardiology (Cardiology) Newman Pies, MD as Consulting Physician (Otolaryngology) Bunnie Pion, [place] (Optometry) Dolores Frame, MD as Consulting Physician (Gastroenterology) Ronne Binning Mardene Celeste, MD as Consulting Physician (Urology) Roma Kayser, MD as Consulting Physician (Endocrinology)  Indicate any recent Medical Services you may have received from other than [facility] providers in the past year (date may be approximate).     Assessment:   This is a routine wellness examination for Billy Hunt.  Hearing/Vision screen Hearing Screening - Comments:: Patient wears hearing aids. Patient is up to date with yearly exams. Sees Dr. Suszanne Conners Vision Screening - Comments:: Wears rx glasses - up to date with routine eye exams  Sees [facility] in [place].  Will be having cataract surgery soon.    Goals Addressed             This Visit's Progress    Patient Stated       Slow down, remain active  and healthy       Depression Screen    10/09/2023    2:05 PM 05/18/2023    8:03 AM 11/14/2022    8:12 AM 09/25/2022    8:16 AM 05/16/2022    9:01 AM 04/20/2022    8:58 AM 03/02/2022    3:47 PM  PHQ 2/9 Scores  PHQ - 2 Score 0 0 0 0 0 0 2  PHQ- 9 Score     0 0 3    Fall Risk    10/07/2023    7:09 PM 05/18/2023    8:03 AM 11/14/2022    8:12 AM 09/24/2022    9:18 PM 05/16/2022    9:01 AM  Fall Risk   Falls in the past year? 0 0 0 1 1  Number falls in past yr: 0 0 0 0 0  Injury with Fall? 0 0 0 1 0  Risk for fall due to : No Fall Risks    History of fall(s)  Follow up Falls prevention discussed    Falls evaluation completed;Education provided;Falls prevention discussed    MEDICARE RISK AT HOME: Medicare Risk at Home Any stairs in or around the home?: (Patient-Rptd) Yes If so, are there any without handrails?: (Patient-Rptd) No Home free of loose throw rugs in walkways, pet beds, electrical cords, etc?: (Patient-Rptd) Yes Adequate lighting in your home to reduce risk of falls?: (Patient-Rptd) Yes Life alert?: (Patient-Rptd) No Use of a cane, walker or w/c?: (Patient-Rptd) No Grab bars in the bathroom?: (Patient-Rptd) Yes Shower chair or bench in shower?: (Patient-Rptd) No Elevated toilet seat or a handicapped toilet?: (Patient-Rptd) No  TIMED UP AND GO:  Was the test performed?  No    Cognitive Function:        10/09/2023    1:54 PM 09/20/2021    8:45 AM 08/16/2020   11:19 AM 08/04/2019    2:59 PM  6CIT Screen  What Year? 0 points 0 points 0 points 0 points  What month? 0 points 0 points 0 points 0 points  What time? 0 points 0 points 0 points 0 points  Count back from 20 0 points 0 points 0 points 0 points  Months in reverse 0 points 0 points 0 points 0 points  Repeat phrase 0 points 0 points 4 points 2 points  Total Score 0  points 0 points 4 points 2 points    Immunizations Immunization History  Administered Date(s) Administered   Fluad Quad(high Dose 65+) 08/04/2019, 08/17/2020, 06/01/2021   Fluad Trivalent(High Dose 65+) 05/18/2023   Influenza-Unspecified 06/11/2022   Moderna Sars-Covid-2 Vaccination 10/23/2019, 11/21/2019, 07/05/2020   Pneumococcal Conjugate-13 08/16/2015   Pneumococcal Polysaccharide-23 07/16/2017   Tdap 05/15/2011, 11/17/2021   Unspecified SARS-COV-2 Vaccination 12/18/2020   Zoster Recombinant(Shingrix) 04/19/2018, 11/17/2021    TDAP status: Up to date  Flu Vaccine status: Up to date  Pneumococcal vaccine status: Up to date  Covid-19 vaccine status: Information provided on how to obtain vaccines.   Qualifies for Shingles Vaccine? No   Zostavax completed No   Shingrix Completed?: Yes  Screening Tests Health Maintenance  Topic Date Due   COVID-19 Vaccine (5 - 2024-25 season) 05/13/2023   Medicare Annual Wellness (AWV)  09/26/2023   DTaP/Tdap/Td (3 - Td or Tdap) 11/18/2031   Pneumonia Vaccine 16+ Years old  Completed   INFLUENZA VACCINE  Completed   Hepatitis C Screening  Completed   Zoster Vaccines- Shingrix  Completed   HPV VACCINES  Aged Out   Colonoscopy  Discontinued    Health Maintenance  Health Maintenance Due  Topic Date Due   COVID-19 Vaccine (5 - 2024-25 season) 05/13/2023   Medicare Annual Wellness (AWV)  09/26/2023    Colorectal cancer screening: No longer required.   Lung Cancer Screening: (Low Dose CT Chest recommended if Age 67-80 years, 20 pack-year currently smoking OR have quit w/in 15years.) does not qualify.   Lung Cancer Screening Referral: na  Additional Screening:  Hepatitis C Screening: does not qualify; Completed   Vision Screening: Recommended annual ophthalmology exams for early detection of glaucoma and other disorders of the eye. Is the patient up to date with their annual eye exam?  Yes  Who is the provider or what is the  name of the office in which the patient attends annual eye exams? [facility] If pt is not established with a provider, would they like to be referred to a provider to establish care? No .   Dental Screening: Recommended annual dental exams for proper oral hygiene  Diabetic Foot Exam: na  Community Resource Referral / Chronic Care Management: CRR required this visit?  No   CCM required this visit?  No     Plan:     I have personally reviewed and noted the following in the patient's chart:   Medical and social history Use of alcohol, tobacco or illicit drugs  Current medications and supplements including opioid prescriptions. Patient is not currently taking opioid prescriptions. Functional ability and status Nutritional status Physical activity Advanced directives List of other physicians Hospitalizations, surgeries, and ER visits in previous 12 months Vitals Screenings to include cognitive, depression, and falls Referrals and appointments  In addition, I have reviewed and discussed with patient certain preventive protocols, quality metrics, and best practice recommendations. A written personalized care plan for preventive services as well as general preventive health recommendations were provided to patient.     Jordan Hawks Giorgio Chabot, CMA   10/09/2023   After Visit Summary: (MyChart) Due to this being a telephonic visit, the after visit summary with patients personalized plan was offered to patient via MyChart   Nurse Notes: na

## 2023-10-11 DIAGNOSIS — E559 Vitamin D deficiency, unspecified: Secondary | ICD-10-CM | POA: Diagnosis not present

## 2023-10-11 DIAGNOSIS — E782 Mixed hyperlipidemia: Secondary | ICD-10-CM | POA: Diagnosis not present

## 2023-10-11 DIAGNOSIS — E210 Primary hyperparathyroidism: Secondary | ICD-10-CM | POA: Diagnosis not present

## 2023-10-12 LAB — PTH, INTACT AND CALCIUM
Calcium: 10.3 mg/dL — ABNORMAL HIGH (ref 8.6–10.2)
PTH: 80 pg/mL — ABNORMAL HIGH (ref 15–65)

## 2023-10-12 LAB — LIPID PANEL
Chol/HDL Ratio: 2 {ratio} (ref 0.0–5.0)
Cholesterol, Total: 124 mg/dL (ref 100–199)
HDL: 63 mg/dL (ref >39)
LDL Chol Calc (NIH): 46 mg/dL (ref 0–99)
Triglycerides: 76 mg/dL (ref 0–149)
VLDL Cholesterol Cal: 15 mg/dL (ref 5–40)

## 2023-10-12 LAB — VITAMIN D 25 HYDROXY (VIT D DEFICIENCY, FRACTURES): Vit D, 25-Hydroxy: 51.5 ng/mL (ref 30.0–100.0)

## 2023-10-18 ENCOUNTER — Ambulatory Visit (INDEPENDENT_AMBULATORY_CARE_PROVIDER_SITE_OTHER): Payer: Medicare Other | Admitting: "Endocrinology

## 2023-10-18 ENCOUNTER — Encounter: Payer: Self-pay | Admitting: "Endocrinology

## 2023-10-18 VITALS — BP 132/68 | HR 68 | Ht 69.0 in | Wt 160.8 lb

## 2023-10-18 DIAGNOSIS — M8580 Other specified disorders of bone density and structure, unspecified site: Secondary | ICD-10-CM | POA: Diagnosis not present

## 2023-10-18 DIAGNOSIS — E559 Vitamin D deficiency, unspecified: Secondary | ICD-10-CM | POA: Diagnosis not present

## 2023-10-18 DIAGNOSIS — E210 Primary hyperparathyroidism: Secondary | ICD-10-CM

## 2023-10-18 MED ORDER — CINACALCET HCL 30 MG PO TABS: 30.0000 mg | ORAL_TABLET | Freq: Two times a day (BID) | ORAL | 1 refills | Status: DC

## 2023-10-18 NOTE — Progress Notes (Signed)
 10/18/2023, 12:15 PM  Endocrinology follow-up note   Billy Hunt is a 78 y.o.-year-old male, who is returning for follow-up after he was seen in consultation for hypercalcemia/hyperparathyroidism, osteopenia. PMD:   [name], MD   Past Medical History:  Diagnosis Date   Acute coronary syndrome (HCC) 07/04/2021   Allergy 1976   Anxiety    BPH (benign prostatic hyperplasia)    Cataract 10/14/2023   Colon cancer screening 05/18/2023   Gout    Hypercholesterolemia    Hyperparathyroidism (HCC)    Hypertension    Myocardial infarction (HCC) 06/2021   Neuromuscular disorder (HCC) 2021   Osteoporosis 2021   Pulmonary emboli (HCC)    Vitamin D  deficiency    Vitamin D  deficiency disease 10/07/2019    Past Surgical History:  Procedure Laterality Date   COLONOSCOPY WITH PROPOFOL  N/A 08/15/2023   Procedure: COLONOSCOPY WITH PROPOFOL ;  Surgeon: Eartha Angelia Sieving, MD;  Location: [facility];  Service: Gastroenterology;  Laterality: N/A;  8:15AM;ASA 1   CORONARY STENT INTERVENTION N/A 07/04/2021   Procedure: CORONARY STENT INTERVENTION;  Surgeon: Mady Bruckner, MD;  Location: [facility];  Service: Cardiovascular;  Laterality: N/A;   CORONARY STENT INTERVENTION N/A 07/06/2021   Procedure: CORONARY STENT INTERVENTION;  Surgeon: Burnard Debby LABOR, MD;  Location: [facility];  Service: Cardiovascular;  Laterality: N/A;   LEFT HEART CATH AND CORONARY ANGIOGRAPHY N/A 07/04/2021   Procedure: LEFT HEART CATH AND CORONARY ANGIOGRAPHY;  Surgeon: Mady Bruckner, MD;  Location: [facility];  Service: Cardiovascular;  Laterality: N/A;   PERIPHERAL VASCULAR THROMBECTOMY     TRANSURETHRAL RESECTION OF PROSTATE     VASECTOMY      Social History   Tobacco Use   Smoking status: Former    Current packs/day: 0.00    Average packs/day: 1 pack/day for 10.0 years (10.0 ttl pk-yrs)    Types: Cigarettes    Start date: 04/20/1961    Quit date: 04/21/1971     Years since quitting: 52.5   Smokeless tobacco: Never  Vaping Use   Vaping status: Never Used  Substance Use Topics   Alcohol use: No    Comment: former   Drug use: Yes    Frequency: 7.0 times per week    Types: Marijuana    Comment: daily, last use 08/13/2023    Family History  Problem Relation Age of Onset   Alzheimer's disease Mother        Vascular Dementia   Stroke Mother    Breast cancer Mother    Cancer Father        Multiple Myeloma   Hypertension Father    Deep vein thrombosis Brother    Heart disease Brother    Cancer Paternal Grandmother    Alcohol abuse Paternal Grandfather     Outpatient Encounter Medications as of 10/18/2023  Medication Sig   cholecalciferol (VITAMIN D3) 25 MCG (1000 UT) tablet Take 1,000 Units by mouth daily.   cinacalcet  (SENSIPAR ) 30 MG tablet Take 1 tablet (30 mg total) by mouth 2 (two) times daily with a meal.   clopidogrel  (PLAVIX ) 75 MG tablet Take 1 tablet by mouth once daily   inclisiran (LEQVIO ) 284 MG/1.5ML SOSY injection Inject 1.5 mLs (284 mg total) into the skin. Every 6 months   nitroGLYCERIN  (NITROSTAT ) 0.4 MG SL tablet Place 1 tablet (0.4 mg total) under the tongue every 5 (five) minutes x 3 doses as needed for chest  pain.   rosuvastatin  (CRESTOR ) 5 MG tablet Take 1 tablet by mouth once daily   tamsulosin  (FLOMAX ) 0.4 MG CAPS capsule Take 1 capsule (0.4 mg total) by mouth 2 (two) times daily.   venlafaxine  XR (EFFEXOR -XR) 150 MG 24 hr capsule Take 1 capsule (150 mg total) by mouth daily.   [DISCONTINUED] cinacalcet  (SENSIPAR ) 30 MG tablet Take 1 tablet (30 mg total) by mouth daily with supper.   No facility-administered encounter medications on file as of 10/18/2023.    Allergies  Allergen Reactions   Sulfa Antibiotics Hives   Sulfamethoxazole Rash     HPI  Billy Hunt reports that he was diagnosed with hypercalcemia approximately 7 years ago with blood test showing high PTH and high calcium .  He denies any history of  nephrolithiasis, seizure disorder, cardiac dysrhythmias.  Denies any history of CKD.   -He however was found to have osteopenia in 2014.  His repeat bone density in [facility] on November 25, 2018 showed left femur osteopenia with a T score of -1.6, left forearm radius 33% T score of -0.2, spine excluded due to degenerative changes.   His bone density on November 26, 2020 shows similar findings still consistent with osteopenia. His most recent bone density in March 2024, showing worsening findings, still consistent with osteopenia. -He is currently not on calcium  supplements, however on vitamin D3 2000 units daily.  His previsit labs show vitamin D  replete at  51.3, calcium  slightly up at 10.3 and PTH of 80 also increasing from 70.  He remains on Sensipar  30 mg p.o. daily at breakfast.  He continues to tolerate this medication.  -He denies any family history of parathyroid, thyroid , pituitary, adrenal dysfunction.    - prior to his last visit, 24-hour urine calcium  on November 22, 2018 was 221. He has no new complaints today.  No prior history of fragility fractures or falls.    He is a former  smoker.   he is not on HCTZ or other thiazide therapy.  She is on Leqvio  and Crestor  for management of dyslipidemia.  His previsit labs show significant dyslipidemia, currently patient not on treatment.  He reports previous intolerance to Lipitor .   BP 132/68   Pulse 68   Ht 5' 9 (1.753 m)   Wt 160 lb 12.8 oz (72.9 kg)   BMI 23.75 kg/m , Body mass index is 23.75 kg/m.   CMP     Component Value Date/Time   NA 142 04/05/2023 0824   K 4.8 04/05/2023 0824   CL 105 04/05/2023 0824   CO2 24 04/05/2023 0824   GLUCOSE 87 04/05/2023 0824   GLUCOSE 137 (H) 07/05/2021 0320   BUN 16 04/05/2023 0824   CREATININE 0.94 04/05/2023 0824   CALCIUM  10.3 (H) 10/11/2023 0930   PROT 7.0 04/05/2023 0824   ALBUMIN 4.2 04/05/2023 0824   AST 16 04/05/2023 0824   ALT 13 04/05/2023 0824   ALKPHOS 101 04/05/2023  0824   BILITOT 0.5 04/05/2023 0824   GFRNONAA >60 07/05/2021 0320   GFRAA >60 07/27/2017 0100   Most recent official lab reports from September 17, 2018: 25-hydroxy vitamin D  43, PTH 76 elevated, calcium  11.4 mg per DL elevated, BUN 25, creatinine 0.98. -He came with separate handwritten summaries of his labs from 2014 showing PTH elevated between 89-1 67 between May and June 2014, 25 hydroxy vitamin D  ranging between 8.6-27 between June and August 2014, phosphorus range from 2.2-2.3 between May and August 2014, 24-hour  urine calcium  192 mg in 24 hours in June 2014. DEXA scan showed mild osteopenia in June 2014.   Surveillance DEXA on November 25, 2018  DualFemur Neck Left 11/25/2018 70.4 N/A -1.6 0.868 g/cm2   Left Forearm Radius 33% 11/25/2018 70.4 N/A -0.2 0.787 g/cm2   ASSESSMENT: BMD as determined from Femur Neck Left is 0.868 g/cm2 with a T-Score of -1.6.   This patient is considered OSTEOPENIC according to the World Health Organization [facility]) criteria.   DualFemur Neck Left 12/06/2022 74.4 N/A -2.3 0.777 g/cm2 -9.3% Yes DualFemur Neck Left 11/26/2020 72.4 N/A -1.6 0.857 g/cm2 -1.3% - DualFemur Neck Left 11/25/2018 70.4 N/A -1.6 0.868 g/cm2 - -   DualFemur Total Mean 12/06/2022 74.4 N/A -1.8 0.843 g/cm2 -7.8% Yes DualFemur Total Mean 11/26/2020 72.4 N/A -1.3 0.914 g/cm2 -6.3% Yes DualFemur Total Mean 11/25/2018 70.4 N/A -0.9 0.975 g/cm2 - -   Left Forearm Radius 33% 12/06/2022 74.4 N/A -0.7 0.753 g/cm2 -2.0% - Left Forearm Radius 33% 11/26/2020 72.4 N/A -0.5 0.768 g/cm2 -2.3% - Left Forearm Radius 33% 11/25/2018 70.4 N/A -0.2 0.787 g/cm2 - - ASSESSMENT: BMD as determined from Femur Neck Left is 0.777 g/cm2 with a T-score of -2.3. This patient is considered osteopenic by World Healh Organization (WHO) Criteria.The scan quality is good. Compared with the prior study on 11/26/20, the BMD of the total mean shows a statistically significant decrease. Lumbar spine was excluded due  to advanced degenerative changes.  Recent Results (from the past 2160 hours)  HM COLONOSCOPY     Status: None   Collection Time: 08/15/23 12:00 AM  Result Value Ref Range   HM Colonoscopy See Report (in chart) See Report (in chart), Patient Reported  Lipid panel     Status: None   Collection Time: 10/11/23  9:30 AM  Result Value Ref Range   Cholesterol, Total 124 100 - 199 mg/dL   Triglycerides 76 0 - 149 mg/dL   HDL 63 >60 mg/dL   VLDL Cholesterol Cal 15 5 - 40 mg/dL   LDL Chol Calc (NIH) 46 0 - 99 mg/dL   Chol/HDL Ratio 2.0 0.0 - 5.0 ratio    Comment:                                   T. Chol/HDL Ratio                                             Men  Women                               1/2 Avg.Risk  3.4    3.3                                   Avg.Risk  5.0    4.4                                2X Avg.Risk  9.6    7.1  3X Avg.Risk 23.4   11.0   PTH, intact and calcium      Status: Abnormal   Collection Time: 10/11/23  9:30 AM  Result Value Ref Range   Calcium  10.3 (H) 8.6 - 10.2 mg/dL   PTH 80 (H) 15 - 65 pg/mL   PTH Interp Comment     Comment: Interpretation                 Intact PTH    Calcium                                  (pg/mL)      (mg/dL) Normal                          15 - 65     8.6 - 10.2 Primary Hyperparathyroidism         >65          >10.2 Secondary Hyperparathyroidism       >65          <10.2 Non-Parathyroid Hypercalcemia       <65          >10.2 Hypoparathyroidism                  <15          < 8.6 Non-Parathyroid Hypocalcemia    15 - 65          < 8.6   VITAMIN D  25 Hydroxy (Vit-D Deficiency, Fractures)     Status: None   Collection Time: 10/11/23  9:30 AM  Result Value Ref Range   Vit D, 25-Hydroxy 51.5 30.0 - 100.0 ng/mL    Comment: Vitamin D  deficiency has been defined by the Institute of Medicine and an Endocrine Society practice guideline as a level of serum 25-OH vitamin D  less than 20 ng/mL (1,2). The Endocrine  Society went on to further define vitamin D  insufficiency as a level between 21 and 29 ng/mL (2). 1. IOM (Institute of Medicine). 2010. Dietary reference    intakes for calcium  and D. [place]: The    [employer]. 2. Holick MF, Binkley [place], Bischoff-Ferrari HA, et al.    Evaluation, treatment, and prevention of vitamin D     deficiency: an Endocrine Society clinical practice    guideline. JCEM. 2011 Jul; 96(7):1911-30.     Assessment: 1. Hypercalcemia / Hyperparathyroidism 2.  Osteopenia 3.  Hyperlipidemia  Plan: His previsit repeat labs show increase in his calcium  to 10.3 and PTH 280.  He is advised to increase Sensipar  to 30 mg p.o. twice daily-with breakfast and supper. His repeat  bone density was consistent with worsening finding, still consistent with osteopenia of femur.  His lumbar spine was excluded due to degenerative joint diseases.  His prior 24-hour urine calcium  was 221.    This is still consistent with primary hyperparathyroidism at a mild form.    He will be considered for repeat bone density in March 2026.   -If he continues to satisfy criteria for surgery, he will be considered for parathyroidectomy after next visit. -He remains on low-dose vitamin D  supplement.  His current vitamin D  supplements is with vitamin D3 1000 units daily, advised to continue.     From the point of view of hyperlipidemia on the background of coronary artery disease, he remains on Leqvio  284 mg subcutaneously every 6 months and  Crestor  5 mg p.o. nightly.  He is advised to continue.        -  he will continue to benefit from lifestyle medicine.  His most recent A1c was 5.6%.  - he acknowledges that there is a room for improvement in his food and drink choices. - Suggestion is made for him to avoid simple carbohydrates  from his diet including Cakes, Sweet Desserts, Ice Cream, Soda (diet and regular), Sweet Tea, Candies, Chips, Cookies, Store Bought Juices, Alcohol ,  Artificial Sweeteners,  Coffee Creamer, and Sugar-free Products, Lemonade. This will help patient to have more stable blood glucose profile and potentially avoid unintended weight gain.  The following Lifestyle Medicine recommendations according to American College of Lifestyle Medicine  [facility]) were discussed and and offered to patient and he  agrees to start the journey:  A. Whole Foods, Plant-Based Nutrition comprising of fruits and vegetables, plant-based proteins, whole-grain carbohydrates was discussed in detail with the patient.   A list for source of those nutrients were also provided to the patient.  Patient will use only water or unsweetened tea for hydration. B.  The need to stay away from risky substances including alcohol, smoking; obtaining 7 to 9 hours of restorative sleep, at least 150 minutes of moderate intensity exercise weekly, the importance of healthy social connections,  and stress management techniques were discussed. C.  A full color page of  Calorie density of various food groups per pound showing examples of each food groups was provided to the patient.    -He is advised to continue his primary care follow-up with Dr. Tobie.    I spent  22  minutes in the care of the patient today including review of labs from Thyroid  Function, CMP, and other relevant labs ; imaging/biopsy records (current and previous including abstractions from other facilities); face-to-face time discussing  his lab results and symptoms, medications doses, his options of short and long term treatment based on the latest standards of care / guidelines;   and documenting the encounter.  Aleene ONEIDA Relic  participated in the discussions, expressed understanding, and voiced agreement with the above plans.  All questions were answered to his satisfaction. he is encouraged to contact clinic should he have any questions or concerns prior to his return visit.   - Return in about 6 months (around 04/16/2024) for  F/U with Pre-visit Labs.   Ranny Earl, MD [facility] [address] [place], [place] [address] Phone: [phone]  Fax: [phone]    This note was partially dictated with voice recognition software. Similar sounding words can be transcribed inadequately or may not  be corrected upon review.  10/18/2023, 12:15 PM

## 2023-10-19 ENCOUNTER — Encounter: Payer: Self-pay | Admitting: Student

## 2023-10-19 ENCOUNTER — Ambulatory Visit: Payer: Medicare Other | Attending: Student | Admitting: Student

## 2023-10-19 VITALS — BP 122/60 | HR 66 | Ht 69.0 in | Wt 161.8 lb

## 2023-10-19 DIAGNOSIS — I2510 Atherosclerotic heart disease of native coronary artery without angina pectoris: Secondary | ICD-10-CM | POA: Diagnosis not present

## 2023-10-19 DIAGNOSIS — E785 Hyperlipidemia, unspecified: Secondary | ICD-10-CM | POA: Insufficient documentation

## 2023-10-19 DIAGNOSIS — I493 Ventricular premature depolarization: Secondary | ICD-10-CM | POA: Insufficient documentation

## 2023-10-19 MED ORDER — LEQVIO 284 MG/1.5ML ~~LOC~~ SOSY: 284.0000 mg | PREFILLED_SYRINGE | SUBCUTANEOUS | Status: AC

## 2023-10-19 NOTE — Patient Instructions (Signed)
 Medication Instructions:  Your physician recommends that you continue on your current medications as directed. Please refer to the Current Medication list given to you today.  *If you need a refill on your cardiac medications before your next appointment, please call your pharmacy*   Lab Work: Your physician recommends that you return for lab work in: Today   If you have labs (blood work) drawn today and your tests are completely normal, you will receive your results only by: MyChart Message (if you have MyChart) OR A paper copy in the mail If you have any lab test that is abnormal or we need to change your treatment, we will call you to review the results.   Testing/Procedures: NONE     Follow-Up: At [facility], you and your health needs are our priority.  As part of our continuing mission to provide you with exceptional heart care, we have created designated Provider Care Teams.  These Care Teams include your primary Cardiologist (physician) and Advanced Practice Providers (APPs -  Physician Assistants and Nurse Practitioners) who all work together to provide you with the care you need, when you need it.  We recommend signing up for the patient portal called MyChart.  Sign up information is provided on this After Visit Summary.  MyChart is used to connect with patients for Virtual Visits (Telemedicine).  Patients are able to view lab/test results, encounter notes, upcoming appointments, etc.  Non-urgent messages can be sent to your provider as well.   To learn more about what you can do with MyChart, go to [email or web address].    Your next appointment:   6 month(s)  Provider:   You may see Vina Gull, MD or one of the following Advanced Practice Providers on your designated Care Team:   Laymon Qua, PA-C  Olivia Pavy, PA-C     Other Instructions Your physician recommends that you schedule a follow-up appointment in: 2 weeks for nurse visit.   Thank you  for choosing [facility]!

## 2023-10-19 NOTE — Progress Notes (Signed)
 Cardiology Office Note    Date:  10/19/2023  ID:  Sadrac, Zeoli 1946/07/16, MRN [id number] Cardiologist: Vina Gull, MD    History of Present Illness:    Billy Hunt is a 78 y.o. male with past medical history of CAD (s/p NSTEMI in 06/2021 with DES to OM2 and staged PCI with DES to LAD), HTN, HLD, history of PE/DVT, COPD and hyperparathyroidism who presents to the office today for 55-month follow-up.  He was last examined by Dr. Gull in 04/2023 and was overall doing well at that time and denied any recent anginal symptoms. There was a discrepancy in his BP and was recommended to check in his right arm going forward. No changes were made to his medications at that time and he was continued on Plavix  75 mg daily, Leqvio  and Crestor  5 mg daily.  In talking with the patient today, he reports overall doing well since his last office visit. He exercises most days of the week by walking on a treadmill for up to an hour and also enjoys working outside. He denies any recent chest pain or dyspnea on exertion with these activities. No recent palpitations, orthopnea, PND or lower extremity edema. He has been following his blood pressure at home and this has overall been well-controlled. His girlfriend who is a former [employer] has noted that he has a pause in his HR at times by review of his BP log.  Studies Reviewed:   EKG: EKG is ordered today and demonstrates:   EKG Interpretation Date/Time:  Friday October 19 2023 13:05:45 EST Ventricular Rate:  74 PR Interval:    QRS Duration:  128 QT Interval:  450 QTC Calculation: 499 R Axis:   -1  Text Interpretation: Normal sinus rhythm with frequent PVC's Right bundle branch block Confirmed by Johnson Grate ([id number]) on 10/19/2023 1:08:09 PM       Cardiac Catheterization: 06/2021 Conclusions: Significant multivessel coronary artery disease, as detailed below.  Culprit lesion for the patient's NSTEMI is likely occluded ostial/proximal OM2  branch.  There is also severe mid LAD and D1 disease. Low normal left ventricular filling pressure (LVEDP ~5 mmHg). Successful PCI to OM2 using Onyx Frontier 2.5 x 26 mm drug-eluting stent with 0% residual stenosis and TIMI-3 flow.   Recommendations: Continue cangrelor  infusion for 2 hours following ticagrelor  load. Aggressive secondary prevention. Wean IV nitroglycerin  as tolerated. Consider staged PCI to mid LAD; timing (during this admission versus as an outpatient in the next few weeks) to be determined based on symptoms.   Coronary Stent Intervention: 06/2021   Ost LM to Mid LM lesion is 20% stenosed.   Prox LAD lesion is 30% stenosed.   Mid LAD-2 lesion is 70% stenosed.   Prox Cx to Mid Cx lesion is 40% stenosed.   1st Diag-1 lesion is 30% stenosed.   1st Diag-2 lesion is 70% stenosed.   2nd Diag lesion is 80% stenosed.   1st Mrg lesion is 70% stenosed.   Mid LAD-1 lesion is 90% stenosed.   Mid LAD-3 lesion is 60% stenosed.   Non-stenotic 2nd Mrg lesion was previously treated.   A drug-eluting stent was successfully placed.   Post intervention, there is a 0% residual stenosis.   Post intervention, there is a 0% residual stenosis.   Post intervention, there is a 0% residual stenosis.   Successful staged PCI to the LAD in this patient who is 2 days status post initial presentation with ACS and total occlusion of the  circumflex marginal vessel which was stented.   The LAD had diffuse disease of 90, 70 % and 60% between the 1st and second diagonal vessel which was successfully stented with a 2.5 x 38 mm Onyx frontier DES stent postdilated to 2.75 mm with the stenosis being reduced to 0%.   RECOMMENDATION: DAPT for minimum of 12 months in this patient status post ACS 2 days previously with successful stenting of the circumflex marginal vessel, and is successful staged PCI to diffusely diseased mid LAD today.  Medical therapy for concomitant RCA disease.  Aggressive lipid-lowering  therapy with target LDL less than 70 and optimal blood pressure control.     Physical Exam:   VS:  BP 122/60 (BP Location: Left Arm, Cuff Size: Normal)   Pulse 66   Ht 5' 9 (1.753 m)   Wt 161 lb 12.8 oz (73.4 kg)   SpO2 96%   BMI 23.89 kg/m    Wt Readings from Last 3 Encounters:  10/19/23 161 lb 12.8 oz (73.4 kg)  10/18/23 160 lb 12.8 oz (72.9 kg)  10/09/23 155 lb (70.3 kg)     GEN: Pleasant male appearing in no acute distress NECK: No JVD; No carotid bruits CARDIAC: RRR with occasional ectopic beats, no murmurs, rubs, gallops RESPIRATORY:  Clear to auscultation without rales, wheezing or rhonchi  ABDOMEN: Appears non-distended. No obvious abdominal masses. EXTREMITIES: No clubbing or cyanosis. No pitting edema.  Distal pedal pulses are 2+ bilaterally.   Assessment and Plan:   1. CAD - He did have an NSTEMI in 06/2021 with DES to OM2 and staged PCI with DES to LAD. He remains very active at baseline and denies any recent anginal symptoms. He has been continued on Plavix  75 mg daily long-term given his multiple stents. Also remains on Crestor  5 mg daily and Leqvio . He has not been on beta-blocker therapy due to baseline bradycardia.  2. PVC's - Noted to have frequent PVC's on his EKG today and he denies any associated palpitations, lightheadedness, dizziness or presyncope. I suspect this has been occurring when his girlfriend has checked his vitals at home given the notes on his BP log. Will check follow-up labs including CBC, CMET, TSH and magnesium. Will arrange for a nurse visit for a follow-up EKG/rhythm strip in 2 weeks. If still having frequent PVC's, would place a 3-day Zio patch to quantify his PVC burden. Would update his echocardiogram if having a significant burden (EF previously 60 to 65% in 06/2021).  3. HLD - FLP last month showed LDL was well-controlled at 46. He has been intolerant to high intensity statin therapy and remains on Crestor  5 mg daily and Leqvio . He is  scheduled for his infusion next Monday and we did check with the infusion clinic to see if this could be moved to [place] but his upcoming injection will have to be in [place] but can be switched to the [place] location afterwards for convenience.   Signed, Laymon CHRISTELLA Qua, PA-C

## 2023-10-22 ENCOUNTER — Ambulatory Visit: Payer: Medicare Other

## 2023-10-22 VITALS — BP 158/72 | HR 63 | Temp 97.9°F | Resp 20 | Ht 69.0 in | Wt 159.4 lb

## 2023-10-22 DIAGNOSIS — E782 Mixed hyperlipidemia: Secondary | ICD-10-CM | POA: Diagnosis not present

## 2023-10-22 DIAGNOSIS — I25110 Atherosclerotic heart disease of native coronary artery with unstable angina pectoris: Secondary | ICD-10-CM | POA: Diagnosis not present

## 2023-10-22 MED ORDER — INCLISIRAN SODIUM 284 MG/1.5ML ~~LOC~~ SOSY
284.0000 mg | PREFILLED_SYRINGE | Freq: Once | SUBCUTANEOUS | Status: AC
  Administered 2023-10-22: 284 mg via SUBCUTANEOUS
  Filled 2023-10-22: qty 1.5

## 2023-10-22 NOTE — Progress Notes (Signed)
 Diagnosis: Hyperlipidemia  Provider:  Mannam, Praveen MD  Procedure: Injection  Leqvio  (inclisiran), Dose: 284 mg, Site: subcutaneous, Number of injections: 1  Injection Site(s): Left arm  Post Care: Patient declined observation  Discharge: Condition: Good, Destination: Home . AVS Declined  Performed by:  Jalisia Puchalski, RN     Pt will do his next injection at [facility].

## 2023-10-23 DIAGNOSIS — I10 Essential (primary) hypertension: Secondary | ICD-10-CM | POA: Diagnosis not present

## 2023-10-23 DIAGNOSIS — I493 Ventricular premature depolarization: Secondary | ICD-10-CM | POA: Diagnosis not present

## 2023-10-24 LAB — COMPREHENSIVE METABOLIC PANEL
ALT: 12 [IU]/L (ref 0–44)
AST: 20 [IU]/L (ref 0–40)
Albumin: 4.3 g/dL (ref 3.8–4.8)
Alkaline Phosphatase: 83 [IU]/L (ref 44–121)
BUN/Creatinine Ratio: 20 (ref 10–24)
BUN: 18 mg/dL (ref 8–27)
Bilirubin Total: 0.5 mg/dL (ref 0.0–1.2)
CO2: 23 mmol/L (ref 20–29)
Calcium: 9.8 mg/dL (ref 8.6–10.2)
Chloride: 101 mmol/L (ref 96–106)
Creatinine, Ser: 0.91 mg/dL (ref 0.76–1.27)
Globulin, Total: 2.9 g/dL (ref 1.5–4.5)
Glucose: 75 mg/dL (ref 70–99)
Potassium: 4.6 mmol/L (ref 3.5–5.2)
Sodium: 139 mmol/L (ref 134–144)
Total Protein: 7.2 g/dL (ref 6.0–8.5)
eGFR: 88 mL/min/{1.73_m2} (ref >59)

## 2023-10-24 LAB — TSH: TSH: 2.08 u[IU]/mL (ref 0.450–4.500)

## 2023-10-24 LAB — CBC
Hematocrit: 43.2 % (ref 37.5–51.0)
Hemoglobin: 14.4 g/dL (ref 13.0–17.7)
MCH: 31.9 pg (ref 26.6–33.0)
MCHC: 33.3 g/dL (ref 31.5–35.7)
MCV: 96 fL (ref 79–97)
Platelets: 184 10*3/uL (ref 150–450)
RBC: 4.51 x10E6/uL (ref 4.14–5.80)
RDW: 12.6 % (ref 11.6–15.4)
WBC: 7 10*3/uL (ref 3.4–10.8)

## 2023-10-24 LAB — MAGNESIUM: Magnesium: 2.1 mg/dL (ref 1.6–2.3)

## 2023-11-02 ENCOUNTER — Ambulatory Visit: Payer: Medicare Other | Attending: Cardiology | Admitting: *Deleted

## 2023-11-02 DIAGNOSIS — I493 Ventricular premature depolarization: Secondary | ICD-10-CM | POA: Diagnosis not present

## 2023-11-02 NOTE — Progress Notes (Signed)
Pt in office for repeat EKG. Pt asymptomatic today. Per [place] Pt will call back if symptoms change and have 3 day monitor placed.

## 2023-11-15 ENCOUNTER — Ambulatory Visit: Payer: Medicare Other | Admitting: Internal Medicine

## 2023-11-20 ENCOUNTER — Other Ambulatory Visit: Payer: Medicare Other

## 2023-11-21 ENCOUNTER — Other Ambulatory Visit: Payer: Medicare Other

## 2023-11-21 DIAGNOSIS — R972 Elevated prostate specific antigen [PSA]: Secondary | ICD-10-CM | POA: Diagnosis not present

## 2023-11-21 DIAGNOSIS — R339 Retention of urine, unspecified: Secondary | ICD-10-CM

## 2023-11-22 LAB — PSA: Prostate Specific Ag, Serum: 8.3 ng/mL — ABNORMAL HIGH (ref 0.0–4.0)

## 2023-11-24 ENCOUNTER — Other Ambulatory Visit: Payer: Self-pay | Admitting: Internal Medicine

## 2023-11-24 DIAGNOSIS — F411 Generalized anxiety disorder: Secondary | ICD-10-CM

## 2023-11-26 ENCOUNTER — Ambulatory Visit (INDEPENDENT_AMBULATORY_CARE_PROVIDER_SITE_OTHER): Payer: Medicare Other | Admitting: Internal Medicine

## 2023-11-26 ENCOUNTER — Encounter: Payer: Self-pay | Admitting: Internal Medicine

## 2023-11-26 VITALS — BP 138/86 | HR 69 | Ht 69.0 in | Wt 161.6 lb

## 2023-11-26 DIAGNOSIS — N401 Enlarged prostate with lower urinary tract symptoms: Secondary | ICD-10-CM

## 2023-11-26 DIAGNOSIS — I10 Essential (primary) hypertension: Secondary | ICD-10-CM

## 2023-11-26 DIAGNOSIS — L239 Allergic contact dermatitis, unspecified cause: Secondary | ICD-10-CM | POA: Diagnosis not present

## 2023-11-26 DIAGNOSIS — E210 Primary hyperparathyroidism: Secondary | ICD-10-CM | POA: Diagnosis not present

## 2023-11-26 DIAGNOSIS — I25110 Atherosclerotic heart disease of native coronary artery with unstable angina pectoris: Secondary | ICD-10-CM

## 2023-11-26 DIAGNOSIS — F411 Generalized anxiety disorder: Secondary | ICD-10-CM | POA: Diagnosis not present

## 2023-11-26 DIAGNOSIS — E782 Mixed hyperlipidemia: Secondary | ICD-10-CM | POA: Diagnosis not present

## 2023-11-26 DIAGNOSIS — N138 Other obstructive and reflux uropathy: Secondary | ICD-10-CM | POA: Diagnosis not present

## 2023-11-26 MED ORDER — VENLAFAXINE HCL ER 150 MG PO CP24
150.0000 mg | ORAL_CAPSULE | Freq: Every day | ORAL | 3 refills | Status: AC
Start: 2023-11-26 — End: ?

## 2023-11-26 MED ORDER — CLOBETASOL PROPIONATE 0.05 % EX CREA: 1.0000 | TOPICAL_CREAM | Freq: Two times a day (BID) | CUTANEOUS | 0 refills | Status: AC

## 2023-11-26 NOTE — Assessment & Plan Note (Addendum)
 S/p PCI X 2 (10/22) On Plavix and Leqvio No chest pain or dyspnea currently Followed by Cardiology - last visit note reviewed

## 2023-11-26 NOTE — Assessment & Plan Note (Signed)
 BP Readings from Last 1 Encounters:  11/26/23 138/86   Well-controlled with diet alone Discontinued Losartan and Coreg in the last visit due to low BP at home Advised DASH diet and moderate exercise/walking, at least 150 mins/week

## 2023-11-26 NOTE — Patient Instructions (Signed)
 Please apply Clobetasol cream to the foot rash.  Please continue to take medications as prescribed.  Please continue to follow low salt diet and perform moderate exercise/walking at least 150 mins/week.

## 2023-11-26 NOTE — Assessment & Plan Note (Addendum)
 Had transaminitis with statin On Leqvio currently Followed by Cardiology

## 2023-11-26 NOTE — Assessment & Plan Note (Addendum)
 S/p TURP On Tamsulosin Elevated PSA, followed by Urology

## 2023-11-26 NOTE — Assessment & Plan Note (Signed)
 Unclear etiology Clobetasol cream prescribed If persistent rash, will refer to dermatology

## 2023-11-26 NOTE — Assessment & Plan Note (Addendum)
 Well-controlled with Effexor 150 mg QD

## 2023-11-26 NOTE — Assessment & Plan Note (Signed)
Stable On Cinacalcet Followed by Dr Nida 

## 2023-11-26 NOTE — Progress Notes (Signed)
 Established Patient Office Visit  Subjective:  Patient ID: Billy Hunt, male    DOB: [birth year]  Age: 78 y.o. MRN: [id number]  CC:  Chief Complaint  Patient presents with   Care Management    6 month f/u, reports left leg concerns some redness noticed it 4-5 months ago.     HPI Billy Hunt is a 78 y.o. male with past medical history of CAD s/p stent placement, hyperparathyroidism, osteopenia, BPH, PE, HLD, gout and anxiety who presents for f/u of his chronic medical conditions.  CAD and HTN:  BP is well-controlled. Patient denies headache, dizziness, chest pain, dyspnea or palpitations. He is on Plavix currently. He is in research study with Leqvio for HLD and is tolerating it well. His liver enzymes were elevated with statin, which have trended down since stopping it.  BPH: Currently well-controlled with Flomax.  His urinary hesitancy has improved with Flomax twice daily.  His PSA was higher compared to prior, followed by urology.  Denies any dysuria or hematuria currently.  Hyperparathyroidism: He takes Cinacalcet for it.  His calcium level was WNL.  He reports a rash on his left foot, appears in patches over dorsal part of the foot and lateral part of lower leg.  Denies itching over the area.  He has tried applying topical antifungal without much relief.  Denies any insect bite. Does not report any change in soap, shampoo, lotion, moisturizer or other new chemical exposure.  Past Medical History:  Diagnosis Date   Acute coronary syndrome (HCC) 07/04/2021   Allergy 1976   Anxiety    BPH (benign prostatic hyperplasia)    Cataract 10/14/2023   Colon cancer screening 05/18/2023   Gout    Hypercholesterolemia    Hyperparathyroidism (HCC)    Hypertension    Myocardial infarction (HCC) 06/2021   Neuromuscular disorder (HCC) 2021   Osteoporosis 2021   Pulmonary emboli (HCC)    Vitamin D deficiency    Vitamin D deficiency disease 10/07/2019    Past Surgical History:   Procedure Laterality Date   COLONOSCOPY WITH PROPOFOL N/A 08/15/2023   Procedure: COLONOSCOPY WITH PROPOFOL;  Surgeon: Dolores Frame, MD;  [facility];  Service: Gastroenterology;  Laterality: N/A;  8:15AM;ASA 1   CORONARY STENT INTERVENTION N/A 07/04/2021   Procedure: CORONARY STENT INTERVENTION;  Surgeon: Yvonne Kendall, MD;  [facility];  Service: Cardiovascular;  Laterality: N/A;   CORONARY STENT INTERVENTION N/A 07/06/2021   Procedure: CORONARY STENT INTERVENTION;  Surgeon: Lennette Bihari, MD;  [facility];  Service: Cardiovascular;  Laterality: N/A;   LEFT HEART CATH AND CORONARY ANGIOGRAPHY N/A 07/04/2021   Procedure: LEFT HEART CATH AND CORONARY ANGIOGRAPHY;  Surgeon: Yvonne Kendall, MD;  [facility];  Service: Cardiovascular;  Laterality: N/A;   PERIPHERAL VASCULAR THROMBECTOMY     TRANSURETHRAL RESECTION OF PROSTATE     VASECTOMY      Family History  Problem Relation Age of Onset   Alzheimer's disease Mother        Vascular Dementia   Stroke Mother    Breast cancer Mother    Cancer Father        Multiple Myeloma   Hypertension Father    Deep vein thrombosis Brother    Heart disease Brother    Cancer Paternal Grandmother    Alcohol abuse Paternal Grandfather     Social History   Socioeconomic History   Marital status: Divorced  Spouse name: Not on file   Number of children: 2   Years of education: 37   Highest education level: Master's degree (e.g., MA, MS, MEng, MEd, MSW, MBA)  Occupational History   Not on file  Tobacco Use   Smoking status: Former    Current packs/day: 0.00    Average packs/day: 1 pack/day for 10.0 years (10.0 ttl pk-yrs)    Types: Cigarettes    Start date: 04/20/1961    Quit date: 04/21/1971    Years since quitting: 52.6   Smokeless tobacco: Never  Vaping Use   Vaping status: Never Used  Substance and Sexual Activity   Alcohol use: No    Comment: former    Drug use: Yes    Frequency: 7.0 times per week    Types: Marijuana    Comment: daily, last use 08/13/2023   Sexual activity: Yes    Birth control/protection: Other-see comments  Other Topics Concern   Not on file  Social History Narrative   Not on file   Social Drivers of Health   Financial Resource Strain: Low Risk  (11/25/2023)   Overall Financial Resource Strain (CARDIA)    Difficulty of Paying Living Expenses: Not very hard  Food Insecurity: No Food Insecurity (11/25/2023)   Hunger Vital Sign    Worried About Running Out of Food in the Last Year: Never true    Ran Out of Food in the Last Year: Never true  Transportation Needs: No Transportation Needs (11/25/2023)   PRAPARE - Administrator, Civil Service (Medical): No    Lack of Transportation (Non-Medical): No  Physical Activity: Sufficiently Active (11/25/2023)   Exercise Vital Sign    Days of Exercise per Week: 5 days    Minutes of Exercise per Session: 60 min  Recent Concern: Physical Activity - Insufficiently Active (10/09/2023)   Exercise Vital Sign    Days of Exercise per Week: 3 days    Minutes of Exercise per Session: 40 min  Stress: No Stress Concern Present (11/25/2023)   [employer] of Occupational Health - Occupational Stress Questionnaire    Feeling of Stress : Not at all  Social Connections: Moderately Integrated (11/25/2023)   Social Connection and Isolation Panel [NHANES]    Frequency of Communication with Friends and Family: More than three times a week    Frequency of Social Gatherings with Friends and Family: Twice a week    Attends Religious Services: 1 to 4 times per year    Active Member of [employer] or Organizations: No    Attends Banker Meetings: Never    Marital Status: Living with partner  Recent Concern: Social Connections - Moderately Isolated (10/09/2023)   Social Connection and Isolation Panel [NHANES]    Frequency of Communication with Friends and Family: More than  three times a week    Frequency of Social Gatherings with Friends and Family: Once a week    Attends Religious Services: Never    Database administrator or Organizations: No    Attends Banker Meetings: Never    Marital Status: Living with partner  Intimate Partner Violence: Not At Risk (10/09/2023)   Humiliation, Afraid, Rape, and Kick questionnaire    Fear of Current or Ex-Partner: No    Emotionally Abused: No    Physically Abused: No    Sexually Abused: No    Outpatient Medications Prior to Visit  Medication Sig Dispense Refill   cholecalciferol (VITAMIN D3) 25 MCG (1000 UT)  tablet Take 1,000 Units by mouth daily.     cinacalcet (SENSIPAR) 30 MG tablet Take 1 tablet (30 mg total) by mouth 2 (two) times daily with a meal. 180 tablet 1   clopidogrel (PLAVIX) 75 MG tablet Take 1 tablet by mouth once daily 90 tablet 2   inclisiran (LEQVIO) 284 MG/1.5ML SOSY injection Inject 1.5 mLs (284 mg total) into the skin every 6 (six) months. Every 6 months     nitroGLYCERIN (NITROSTAT) 0.4 MG SL tablet Place 1 tablet (0.4 mg total) under the tongue every 5 (five) minutes x 3 doses as needed for chest pain. 25 tablet 2   rosuvastatin (CRESTOR) 5 MG tablet Take 1 tablet by mouth once daily 90 tablet 2   tamsulosin (FLOMAX) 0.4 MG CAPS capsule Take 1 capsule (0.4 mg total) by mouth 2 (two) times daily. 180 capsule 3   venlafaxine XR (EFFEXOR-XR) 150 MG 24 hr capsule Take 1 capsule (150 mg total) by mouth daily. 90 capsule 3   No facility-administered medications prior to visit.    Allergies  Allergen Reactions   Sulfa Antibiotics Hives   Sulfamethoxazole Rash    ROS Review of Systems  Constitutional:  Negative for chills and fever.  HENT:  Negative for congestion and sore throat.   Eyes:  Negative for pain and discharge.  Respiratory:  Negative for cough and shortness of breath.   Cardiovascular:  Negative for chest pain and palpitations.  Gastrointestinal:  Negative for  diarrhea, nausea and vomiting.  Endocrine: Negative for polydipsia and polyuria.  Genitourinary:  Negative for dysuria and hematuria.  Musculoskeletal:  Negative for neck pain and neck stiffness.       Right shoulder pain  Skin:  Positive for rash.  Neurological:  Negative for dizziness, weakness and headaches.  Psychiatric/Behavioral:  Negative for agitation and behavioral problems.       Objective:    Physical Exam Vitals reviewed.  Constitutional:      General: He is not in acute distress.    Appearance: He is not diaphoretic.  HENT:     Head: Normocephalic and atraumatic.     Nose: Nose normal.     Mouth/Throat:     Mouth: Mucous membranes are moist.  Eyes:     General: No scleral icterus.    Extraocular Movements: Extraocular movements intact.  Cardiovascular:     Rate and Rhythm: Normal rate and regular rhythm.     Pulses: Normal pulses.     Heart sounds: Normal heart sounds. No murmur heard. Pulmonary:     Breath sounds: Normal breath sounds. No wheezing or rales.  Musculoskeletal:     Right shoulder: Tenderness present. Decreased range of motion.     Cervical back: Neck supple. No tenderness.     Right lower leg: No edema.     Left lower leg: No edema.  Skin:    General: Skin is warm.     Findings: Rash (Erythematous patches over lower part of left leg, and dorsal part of the left foot) present.  Neurological:     General: No focal deficit present.     Mental Status: He is alert and oriented to person, place, and time.  Psychiatric:        Mood and Affect: Mood normal.        Behavior: Behavior normal.     BP 138/86 (BP Location: Left Arm)   Pulse 69   Ht 5\' 9"  (1.753 m)   Wt 161 lb 9.6 oz (  73.3 kg)   SpO2 95%   BMI 23.86 kg/m  Wt Readings from Last 3 Encounters:  11/26/23 161 lb 9.6 oz (73.3 kg)  10/22/23 159 lb 6.4 oz (72.3 kg)  10/19/23 161 lb 12.8 oz (73.4 kg)    Lab Results  Component Value Date   TSH 2.080 10/23/2023   Lab Results   Component Value Date   WBC 7.0 10/23/2023   HGB 14.4 10/23/2023   HCT 43.2 10/23/2023   MCV 96 10/23/2023   PLT 184 10/23/2023   Lab Results  Component Value Date   NA 139 10/23/2023   K 4.6 10/23/2023   CO2 23 10/23/2023   GLUCOSE 75 10/23/2023   BUN 18 10/23/2023   CREATININE 0.91 10/23/2023   BILITOT 0.5 10/23/2023   ALKPHOS 83 10/23/2023   AST 20 10/23/2023   ALT 12 10/23/2023   PROT 7.2 10/23/2023   ALBUMIN 4.3 10/23/2023   CALCIUM 9.8 10/23/2023   ANIONGAP 9 07/05/2021   EGFR 88 10/23/2023   Lab Results  Component Value Date   CHOL 124 10/11/2023   Lab Results  Component Value Date   HDL 63 10/11/2023   Lab Results  Component Value Date   LDLCALC 46 10/11/2023   Lab Results  Component Value Date   TRIG 76 10/11/2023   Lab Results  Component Value Date   CHOLHDL 2.0 10/11/2023   Lab Results  Component Value Date   HGBA1C 5.6 05/10/2022      Assessment & Plan:   Problem List Items Addressed This Visit       Cardiovascular and Mediastinum   HTN (hypertension)   BP Readings from Last 1 Encounters:  11/26/23 138/86   Well-controlled with diet alone Discontinued Losartan and Coreg in the last visit due to low BP at home Advised DASH diet and moderate exercise/walking, at least 150 mins/week      Coronary artery disease involving native coronary artery of native heart with unstable angina pectoris (HCC) - Primary   S/p PCI X 2 (10/22) On Plavix and Leqvio No chest pain or dyspnea currently Followed by Cardiology - last visit note reviewed        Endocrine   Hyperparathyroidism, primary (HCC)   Stable On Cinacalcet Followed by Dr Fransico Him        Musculoskeletal and Integument   Allergic contact dermatitis   Unclear etiology Clobetasol cream prescribed If persistent rash, will refer to dermatology      Relevant Medications   clobetasol cream (TEMOVATE) 0.05 %     Genitourinary   BPH with obstruction/lower urinary tract symptoms    S/p TURP On Tamsulosin Elevated PSA, followed by Urology        Other   Mixed hyperlipidemia (Chronic)   Had transaminitis with statin On Leqvio currently Followed by Cardiology      GAD (generalized anxiety disorder) (Chronic)   Well-controlled with Effexor 150 mg QD      Relevant Medications   venlafaxine XR (EFFEXOR-XR) 150 MG 24 hr capsule     Meds ordered this encounter  Medications   venlafaxine XR (EFFEXOR-XR) 150 MG 24 hr capsule    Sig: Take 1 capsule (150 mg total) by mouth daily.    Dispense:  90 capsule    Refill:  3   clobetasol cream (TEMOVATE) 0.05 %    Sig: Apply 1 Application topically 2 (two) times daily.    Dispense:  30 g    Refill:  0    Follow-up:  Return in about 6 months (around 05/28/2024) for CAD and GAD.    Anabel Halon, MD

## 2023-11-28 ENCOUNTER — Ambulatory Visit (INDEPENDENT_AMBULATORY_CARE_PROVIDER_SITE_OTHER): Payer: Medicare Other | Admitting: Urology

## 2023-11-28 VITALS — BP 160/67 | HR 58

## 2023-11-28 DIAGNOSIS — R339 Retention of urine, unspecified: Secondary | ICD-10-CM | POA: Diagnosis not present

## 2023-11-28 DIAGNOSIS — R972 Elevated prostate specific antigen [PSA]: Secondary | ICD-10-CM | POA: Diagnosis not present

## 2023-11-28 DIAGNOSIS — R350 Frequency of micturition: Secondary | ICD-10-CM

## 2023-11-28 DIAGNOSIS — N401 Enlarged prostate with lower urinary tract symptoms: Secondary | ICD-10-CM | POA: Diagnosis not present

## 2023-11-28 DIAGNOSIS — N138 Other obstructive and reflux uropathy: Secondary | ICD-10-CM | POA: Diagnosis not present

## 2023-11-28 LAB — URINALYSIS, ROUTINE W REFLEX MICROSCOPIC
Bilirubin, UA: NEGATIVE
Glucose, UA: NEGATIVE
Ketones, UA: NEGATIVE
Leukocytes,UA: NEGATIVE
Nitrite, UA: NEGATIVE
Protein,UA: NEGATIVE
Specific Gravity, UA: 1.01 (ref 1.005–1.030)
Urobilinogen, Ur: 0.2 mg/dL (ref 0.2–1.0)
pH, UA: 7 (ref 5.0–7.5)

## 2023-11-28 LAB — MICROSCOPIC EXAMINATION: Bacteria, UA: NONE SEEN

## 2023-11-28 MED ORDER — TAMSULOSIN HCL 0.4 MG PO CAPS: 0.4000 mg | ORAL_CAPSULE | Freq: Two times a day (BID) | ORAL | 3 refills | Status: DC

## 2023-11-28 NOTE — Patient Instructions (Signed)

## 2023-11-28 NOTE — Progress Notes (Signed)
 11/28/2023 9:30 AM   Billy Hunt [birth year] [id number]  Referring provider: Anabel Halon, MD [address] [place],  [place] [address]  Followup PSA   HPI: Mr Billy Hunt is a 77yo here for followup for BPH and elevated PSA. PSA increased to 8.3. IPSS 13 QOl 2 on flomax 0.4mg  BID. [place]irne stream fair. No straining to urinate. He had a TURP in 2017 at [facility]. He has had multiple negative biopsies.    PMH: Past Medical History:  Diagnosis Date   Acute coronary syndrome (HCC) 07/04/2021   Allergy 1976   Anxiety    BPH (benign prostatic hyperplasia)    Cataract 10/14/2023   Colon cancer screening 05/18/2023   Gout    Hypercholesterolemia    Hyperparathyroidism (HCC)    Hypertension    Myocardial infarction (HCC) 06/2021   Neuromuscular disorder (HCC) 2021   Osteoporosis 2021   Pulmonary emboli (HCC)    Vitamin D deficiency    Vitamin D deficiency disease 10/07/2019    Surgical History: Past Surgical History:  Procedure Laterality Date   COLONOSCOPY WITH PROPOFOL N/A 08/15/2023   Procedure: COLONOSCOPY WITH PROPOFOL;  Surgeon: Dolores Frame, MD;  [facility];  Service: Gastroenterology;  Laterality: N/A;  8:15AM;ASA 1   CORONARY STENT INTERVENTION N/A 07/04/2021   Procedure: CORONARY STENT INTERVENTION;  Surgeon: Yvonne Kendall, MD;  [facility];  Service: Cardiovascular;  Laterality: N/A;   CORONARY STENT INTERVENTION N/A 07/06/2021   Procedure: CORONARY STENT INTERVENTION;  Surgeon: Lennette Bihari, MD;  [facility];  Service: Cardiovascular;  Laterality: N/A;   LEFT HEART CATH AND CORONARY ANGIOGRAPHY N/A 07/04/2021   Procedure: LEFT HEART CATH AND CORONARY ANGIOGRAPHY;  Surgeon: Yvonne Kendall, MD;  [facility];  Service: Cardiovascular;  Laterality: N/A;   PERIPHERAL VASCULAR THROMBECTOMY     TRANSURETHRAL RESECTION OF PROSTATE     VASECTOMY      Home Medications:  Allergies as  of 11/28/2023       Reactions   Sulfa Antibiotics Hives   Sulfamethoxazole Rash        Medication List        Accurate as of November 28, 2023  9:30 AM. If you have any questions, ask your nurse or doctor.          cholecalciferol 25 MCG (1000 UNIT) tablet Commonly known as: VITAMIN D3 Take 1,000 Units by mouth daily.   cinacalcet 30 MG tablet Commonly known as: SENSIPAR Take 1 tablet (30 mg total) by mouth 2 (two) times daily with a meal.   clobetasol cream 0.05 % Commonly known as: TEMOVATE Apply 1 Application topically 2 (two) times daily.   clopidogrel 75 MG tablet Commonly known as: PLAVIX Take 1 tablet by mouth once daily   Leqvio 284 MG/1.5ML Sosy injection Generic drug: inclisiran Inject 1.5 mLs (284 mg total) into the skin every 6 (six) months. Every 6 months   nitroGLYCERIN 0.4 MG SL tablet Commonly known as: NITROSTAT Place 1 tablet (0.4 mg total) under the tongue every 5 (five) minutes x 3 doses as needed for chest pain.   rosuvastatin 5 MG tablet Commonly known as: CRESTOR Take 1 tablet by mouth once daily   tamsulosin 0.4 MG Caps capsule Commonly known as: FLOMAX Take 1 capsule (0.4 mg total) by mouth 2 (two) times daily.   venlafaxine XR 150 MG 24 hr capsule Commonly known as: EFFEXOR-XR Take 1 capsule (150 mg total)  by mouth daily.        Allergies:  Allergies  Allergen Reactions   Sulfa Antibiotics Hives   Sulfamethoxazole Rash    Family History: Family History  Problem Relation Age of Onset   Alzheimer's disease Mother        Vascular Dementia   Stroke Mother    Breast cancer Mother    Cancer Father        Multiple Myeloma   Hypertension Father    Deep vein thrombosis Brother    Heart disease Brother    Cancer Paternal Grandmother    Alcohol abuse Paternal Grandfather     Social History:  reports that he quit smoking about 52 years ago. His smoking use included cigarettes. He started smoking about 62 years ago. He has a  10 pack-year smoking history. He has never used smokeless tobacco. He reports current drug use. Frequency: 7.00 times per week. Drug: Marijuana. He reports that he does not drink alcohol.  ROS: All other review of systems were reviewed and are negative except what is noted above in HPI  Physical Exam: BP (!) 160/67   Pulse (!) 58   Constitutional:  Alert and oriented, No acute distress. HEENT: [place] AT, moist mucus membranes.  Trachea midline, no masses. Cardiovascular: No clubbing, cyanosis, or edema. Respiratory: Normal respiratory effort, no increased work of breathing. GI: Abdomen is soft, nontender, nondistended, no abdominal masses GU: No CVA tenderness.  Lymph: No cervical or inguinal lymphadenopathy. Skin: No rashes, bruises or suspicious lesions. Neurologic: Grossly intact, no focal deficits, moving all 4 extremities. Psychiatric: Normal mood and affect.  Laboratory Data: Lab Results  Component Value Date   WBC 7.0 10/23/2023   HGB 14.4 10/23/2023   HCT 43.2 10/23/2023   MCV 96 10/23/2023   PLT 184 10/23/2023    Lab Results  Component Value Date   CREATININE 0.91 10/23/2023    No results found for: "PSA"  No results found for: "TESTOSTERONE"  Lab Results  Component Value Date   HGBA1C 5.6 05/10/2022    Urinalysis    Component Value Date/Time   COLORURINE YELLOW 06/12/2015 0855   APPEARANCEUR Clear 05/25/2023 0836   LABSPEC 1.010 06/12/2015 0855   PHURINE 6.0 06/12/2015 0855   GLUCOSEU Negative 05/25/2023 0836   HGBUR LARGE (A) 06/12/2015 0855   BILIRUBINUR Negative 05/25/2023 0836   KETONESUR small (15) (A) 12/01/2021 0920   KETONESUR NEGATIVE 06/12/2015 0855   PROTEINUR Negative 05/25/2023 0836   PROTEINUR NEGATIVE 06/12/2015 0855   UROBILINOGEN 4.0 (A) 12/01/2021 0920   UROBILINOGEN 0.2 06/12/2015 0855   NITRITE Negative 05/25/2023 0836   NITRITE NEGATIVE 06/12/2015 0855   LEUKOCYTESUR Negative 05/25/2023 0836    Lab Results  Component Value  Date   LABMICR See below: 05/25/2023   WBCUA None seen 05/25/2023   LABEPIT None seen 05/25/2023   MUCUS Present 05/02/2022   BACTERIA None seen 05/25/2023    Pertinent Imaging:  No results found for this or any previous visit.  No results found for this or any previous visit.  No results found for this or any previous visit.  No results found for this or any previous visit.  No results found for this or any previous visit.  No results found for this or any previous visit.  Results for orders placed during the hospital encounter of 12/09/21  CT HEMATURIA WORKUP  Narrative CLINICAL DATA:  Gross hematuria for 2 weeks, elevated PSA, history of BPH and TURP  EXAM: CT ABDOMEN  AND PELVIS WITHOUT AND WITH CONTRAST  TECHNIQUE: Multidetector CT imaging of the abdomen and pelvis was performed following the standard protocol before and following the bolus administration of intravenous contrast.  RADIATION DOSE REDUCTION: This exam was performed according to the departmental dose-optimization program which includes automated exposure control, adjustment of the mA and/or kV according to patient size and/or use of iterative reconstruction technique.  CONTRAST:  OMNIPAQUE IOHEXOL 300 MG/ML  SOLN  COMPARISON:  None.  FINDINGS: Lower chest: No acute abnormality. Eventration of the posterior right hemidiaphragm with associated scarring and atelectasis. Coronary artery calcifications. Small hiatal hernia.  Hepatobiliary: No solid liver abnormality is seen. No gallstones, gallbladder wall thickening, or biliary dilatation.  Pancreas: Unremarkable. No pancreatic ductal dilatation or surrounding inflammatory changes.  Spleen: Normal in size without significant abnormality.  Adrenals/Urinary Tract: Adrenal glands are unremarkable. The right kidney is in a very high position due to eventration of the right hemidiaphragm. Punctuate nonobstructive calculi of the superior  pole of the right kidney (series 3, image 6). No left-sided calculi, ureteral calculi, or hydronephrosis. No suspicious mass or contrast enhancement. Simple, benign cyst of the posterior midportion of the left kidney, for which no further follow-up or characterization is required. No urinary tract filling defect on delayed phase imaging. Bladder is unremarkable.  Stomach/Bowel: Stomach is within normal limits. Appendix appears normal. No evidence of bowel wall thickening, distention, or inflammatory changes. Descending and sigmoid diverticulosis. Large burden of stool throughout the colon and rectum.  Vascular/Lymphatic: Aortic atherosclerosis. No enlarged abdominal or pelvic lymph nodes.  Reproductive: Severe prostatomegaly with TURP defect.  Other: No abdominal wall hernia or abnormality. No ascites.  Musculoskeletal: No acute or significant osseous findings.  IMPRESSION: 1. Punctuate nonobstructive calculi of the superior pole of the right kidney. No left-sided calculi, ureteral calculi, or hydronephrosis. 2. No suspicious mass or contrast enhancement. No urinary tract filling defect on delayed phase imaging. 3. Severe prostatomegaly with TURP defect. 4. Descending and sigmoid diverticulosis without evidence of acute diverticulitis. 5. Coronary artery disease.  Aortic Atherosclerosis (ICD10-I70.0).   Electronically Signed By: Jearld Lesch M.D. On: 12/10/2021 11:00  No results found for this or any previous visit.   Assessment & Plan:    1. Elevated PSA (Primary) Iso PSA -If noprmal folllowup 6 months with PSA - Urinalysis, Routine w reflex microscopic  2. Incomplete bladder emptying Flomax 0.4mg  BID  3. BPH with obstruction/lower urinary tract symptoms Flomax 0.4mg  BID   No follow-ups on file.  Wilkie Aye, MD  [facility] [place]

## 2023-12-04 ENCOUNTER — Encounter: Payer: Self-pay | Admitting: Urology

## 2023-12-06 ENCOUNTER — Encounter: Payer: Self-pay | Admitting: Urology

## 2024-01-21 ENCOUNTER — Telehealth: Payer: Self-pay

## 2024-01-21 NOTE — Telephone Encounter (Signed)
 Copied from CRM [id number]. Topic: Clinical - Medication Question >> Jan 18, 2024  3:36 PM Lizabeth Riggs wrote: Reason for CRM:  Billy Hunt would like to get medication for his poison ivy. The places are itching and burning. It is spreading. Please call Billy Hunt at [phone] to let him know if medication can be call in to his pharmacy. Thanks

## 2024-01-21 NOTE — Telephone Encounter (Signed)
 Patient advised, he said he has been using the cream and benadryl, denies making an appointment

## 2024-02-12 DIAGNOSIS — H25043 Posterior subcapsular polar age-related cataract, bilateral: Secondary | ICD-10-CM | POA: Diagnosis not present

## 2024-02-12 DIAGNOSIS — H2512 Age-related nuclear cataract, left eye: Secondary | ICD-10-CM | POA: Diagnosis not present

## 2024-02-12 DIAGNOSIS — H02831 Dermatochalasis of right upper eyelid: Secondary | ICD-10-CM | POA: Diagnosis not present

## 2024-02-12 DIAGNOSIS — H25013 Cortical age-related cataract, bilateral: Secondary | ICD-10-CM | POA: Diagnosis not present

## 2024-02-12 DIAGNOSIS — H2513 Age-related nuclear cataract, bilateral: Secondary | ICD-10-CM | POA: Diagnosis not present

## 2024-02-12 DIAGNOSIS — H18413 Arcus senilis, bilateral: Secondary | ICD-10-CM | POA: Diagnosis not present

## 2024-03-01 NOTE — Progress Notes (Unsigned)
 3   Cardiology Office Note   Date:  03/06/2024   ID:  Hunt, Billy 1946-03-21, MRN [id number]  PCP:  [name], MD  Cardiologist:   Vina Gull, MD   F/U of CAD      History of Present Illness: Billy Hunt is a 78 y.o. male with a history of CAD (NSTEMI in 06/2021), Cath showed multivessel dz.   Pt underwent PCI/DES to an occluded OM1 and then PCI/DES to LAD (staged intervention)    Pt also with a hx of HTN, hyperparthyoidism, BPH, DVT/PE, HL, gout, neuropathy, COPD    I saw the pt in Dec 2023   Since seen the pt says he has done well  Breathing is good  No CP   no dizziness   I saw the pt in clnic in Aug 2024  He was seen by B Strader in the interval.     Since seen he has done well  Breathing is good  No CP    He is very active   Walks 3.5 miles on treadmill 4 to 5 x per week     BP at home usually 110s to 120s/  Current Meds  Medication Sig   cholecalciferol (VITAMIN D3) 25 MCG (1000 UT) tablet Take 1,000 Units by mouth daily.   cinacalcet  (SENSIPAR ) 30 MG tablet Take 1 tablet (30 mg total) by mouth 2 (two) times daily with a meal.   clobetasol  cream (TEMOVATE ) 0.05 % Apply 1 Application topically 2 (two) times daily.   clopidogrel  (PLAVIX ) 75 MG tablet Take 1 tablet by mouth once daily   inclisiran (LEQVIO ) 284 MG/1.5ML SOSY injection Inject 1.5 mLs (284 mg total) into the skin every 6 (six) months. Every 6 months   nitroGLYCERIN  (NITROSTAT ) 0.4 MG SL tablet Place 1 tablet (0.4 mg total) under the tongue every 5 (five) minutes x 3 doses as needed for chest pain.   rosuvastatin  (CRESTOR ) 5 MG tablet Take 1 tablet by mouth once daily   tamsulosin  (FLOMAX ) 0.4 MG CAPS capsule Take 1 capsule (0.4 mg total) by mouth 2 (two) times daily.   venlafaxine  XR (EFFEXOR -XR) 150 MG 24 hr capsule Take 1 capsule (150 mg total) by mouth daily.     Allergies:   Sulfa antibiotics and Sulfamethoxazole   Past Medical History:  Diagnosis Date   Acute coronary syndrome  (HCC) 07/04/2021   Allergy 1976   Anxiety    BPH (benign prostatic hyperplasia)    Cataract 10/14/2023   Colon cancer screening 05/18/2023   Gout    Hypercholesterolemia    Hyperparathyroidism (HCC)    Hypertension    Myocardial infarction (HCC) 06/2021   Neuromuscular disorder (HCC) 2021   Osteoporosis 2021   Pulmonary emboli (HCC)    Vitamin D  deficiency    Vitamin D  deficiency disease 10/07/2019    Past Surgical History:  Procedure Laterality Date   CATARACT EXTRACTION     COLONOSCOPY WITH PROPOFOL  N/A 08/15/2023   Procedure: COLONOSCOPY WITH PROPOFOL ;  Surgeon: Eartha Angelia Sieving, MD;  [facility];  Service: Gastroenterology;  Laterality: N/A;  8:15AM;ASA 1   CORONARY STENT INTERVENTION N/A 07/04/2021   Procedure: CORONARY STENT INTERVENTION;  Surgeon: Mady Bruckner, MD;  [facility];  Service: Cardiovascular;  Laterality: N/A;   CORONARY STENT INTERVENTION N/A 07/06/2021   Procedure: CORONARY STENT INTERVENTION;  Surgeon: Burnard Debby LABOR, MD;  [facility];  Service: Cardiovascular;  Laterality: N/A;   LEFT  HEART CATH AND CORONARY ANGIOGRAPHY N/A 07/04/2021   Procedure: LEFT HEART CATH AND CORONARY ANGIOGRAPHY;  Surgeon: Mady Bruckner, MD;  [facility];  Service: Cardiovascular;  Laterality: N/A;   PERIPHERAL VASCULAR THROMBECTOMY     TRANSURETHRAL RESECTION OF PROSTATE     VASECTOMY       Social History:  The patient  reports that he quit smoking about 52 years ago. His smoking use included cigarettes. He started smoking about 62 years ago. He has a 10 pack-year smoking history. He has never used smokeless tobacco. He reports current drug use. Frequency: 7.00 times per week. Drug: Marijuana. He reports that he does not drink alcohol.   Family History:  The patient's family history includes Alcohol abuse in his paternal grandfather; Alzheimer's disease in his mother; Breast cancer in his mother; Cancer  in his father and paternal grandmother; Deep vein thrombosis in his brother; Heart disease in his brother; Hypertension in his father; Stroke in his mother.    ROS:  Please see the history of present illness. All other systems are reviewed and  Negative to the above problem except as noted.    PHYSICAL EXAM: VS:  BP 136/84   Pulse 63   Ht 5' 9 (1.753 m)   Wt 157 lb 12.8 oz (71.6 kg)   SpO2 95%   BMI 23.30 kg/m    BP on my check  R arm 150/80   L arm 135/80    GEN: Well nourished, well developed, in no acute distress  HEENT: normal  Neck: no JVD or bruits  Cardiac: RRR; no murmur,  No LE edema  Respiratory:  clear to auscultation  GI: soft, nontender  No hepatomegaly  MS: no deformity Moving all extremities     EKG:  EKG is not ordered today  CATH 06/2021     Ost LM to Mid LM lesion is 20% stenosed.   Prox LAD lesion is 30% stenosed.   Mid LAD-2 lesion is 70% stenosed.   Prox Cx to Mid Cx lesion is 40% stenosed.   1st Diag-1 lesion is 30% stenosed.   1st Diag-2 lesion is 70% stenosed.   2nd Diag lesion is 80% stenosed.   1st Mrg lesion is 70% stenosed.   Mid LAD-1 lesion is 90% stenosed.   Mid LAD-3 lesion is 60% stenosed.   Non-stenotic 2nd Mrg lesion was previously treated.   A drug-eluting stent was successfully placed.   Post intervention, there is a 0% residual stenosis.   Post intervention, there is a 0% residual stenosis.   Post intervention, there is a 0% residual stenosis.   Successful staged PCI to the LAD in this patient who is 2 days status post initial presentation with ACS and total occlusion of the circumflex marginal vessel which was stented.   The LAD had diffuse disease of 90, 70 % and 60% between the 1st and second diagonal vessel which was successfully stented with a 2.5 x 38 mm Onyx frontier DES stent postdilated to 2.75 mm with the stenosis being reduced to 0%.   RECOMMENDATION: DAPT for minimum of 12 months in this patient status post ACS 2  days previously with successful stenting of the circumflex marginal vessel, and is successful staged PCI to diffusely diseased mid LAD today.  Medical therapy for concomitant RCA disease.  Aggressive lipid-lowering therapy with target LDL less than 70 and optimal blood pressure control.  Diagnostic Dominance: Right Intervention  PCI 07/04/2021 Dr. Mady CORONARY STENT INTERVENTION  LEFT HEART CATH AND CORONARY ANGIOGRAPHY    Conclusion   Conclusions: Significant multivessel coronary artery disease, as detailed below.  Culprit lesion for the patient's NSTEMI is likely occluded ostial/proximal OM2 branch.  There is also severe mid LAD and D1 disease. Low normal left ventricular filling pressure (LVEDP ~5 mmHg). Successful PCI to OM2 using Onyx Frontier 2.5 x 26 mm drug-eluting stent with 0% residual stenosis and TIMI-3 flow.   Recommendations: Continue cangrelor  infusion for 2 hours following ticagrelor  load. Aggressive secondary prevention. Wean IV nitroglycerin  as tolerated. Consider staged PCI to mid LAD; timing (during this admission versus as an outpatient in the next few weeks) to be determined based on symptoms.     Diagnostic Dominance: Right Intervention        Echocardiogram 07/04/2021 1. Left ventricular ejection fraction, by estimation, is 60 to 65%. The  left ventricle has normal function. The left ventricle has no regional  wall motion abnormalities. The left ventricular internal cavity size was  mildly dilated. There is mild left  ventricular hypertrophy. Left ventricular diastolic parameters are  consistent with Grade I diastolic dysfunction (impaired relaxation).   2. Right ventricular systolic function is normal. The right ventricular  size is normal. There is mildly elevated pulmonary artery systolic  pressure.   3. Left atrial size was mildly dilated.   4. The mitral valve is normal in structure. Mild mitral valve  regurgitation.   5. The aortic  valve is tricuspid. Aortic valve regurgitation is mild.  Mild aortic valve sclerosis is present, with no evidence of aortic valve  stenosis.   Comparison(s): The left ventricular function is unchanged.          Lipid Panel    Component Value Date/Time   CHOL 124 10/11/2023 0930   TRIG 76 10/11/2023 0930   HDL 63 10/11/2023 0930   CHOLHDL 2.0 10/11/2023 0930   CHOLHDL 4.4 07/05/2021 0320   VLDL 9 07/05/2021 0320   LDLCALC 46 10/11/2023 0930      Wt Readings from Last 3 Encounters:  03/06/24 157 lb 12.8 oz (71.6 kg)  11/26/23 161 lb 9.6 oz (73.3 kg)  10/22/23 159 lb 6.4 oz (72.3 kg)      ASSESSMENT AND PLAN:   CAD    Pt s/p NSTEMI in October 2022   s/p PTCA/Stent to an occluded OM and then staged procedure for PTCA/DES to LAD        Pt is asymptomatic   Follow   2   HTN  BP readings are good   Continue to follow     3   HL  last LDL 46  HDL 63  Trig 76   Continue on Leqvio  and Crestor    WIll check to see if infusion can be done in Short   Follow up in 8 months   Current medicines are reviewed at length with the patient today.  The patient does not have concerns regarding medicines.  Signed, Vina Gull, MD  03/06/2024 1:12 PM    [facility] [address] [place], [place], [place]  [address] Phone: [phone]; Fax: [phone]

## 2024-03-06 ENCOUNTER — Ambulatory Visit: Payer: Medicare Other | Attending: Internal Medicine | Admitting: Internal Medicine

## 2024-03-06 ENCOUNTER — Encounter: Payer: Self-pay | Admitting: Internal Medicine

## 2024-03-06 VITALS — BP 136/84 | HR 63 | Ht 69.0 in | Wt 157.8 lb

## 2024-03-06 DIAGNOSIS — I2510 Atherosclerotic heart disease of native coronary artery without angina pectoris: Secondary | ICD-10-CM | POA: Insufficient documentation

## 2024-03-06 NOTE — Patient Instructions (Signed)
 Medication Instructions:  Your physician recommends that you continue on your current medications as directed. Please refer to the Current Medication list given to you today.   Labwork: None   Testing/Procedures: none  Follow-Up: 8 months  Any Other Special Instructions Will Be Listed Below (If Applicable).  If you need a refill on your cardiac medications before your next appointment, please call your pharmacy.

## 2024-03-18 ENCOUNTER — Encounter: Payer: Self-pay | Admitting: Internal Medicine

## 2024-03-18 ENCOUNTER — Telehealth: Payer: Self-pay

## 2024-03-18 NOTE — Telephone Encounter (Signed)
 Auth Submission: NO AUTH NEEDED Site of care: Site of care: [facility] Payer: medicare a/b, aarp supp Medication & CPT/J Code(s) submitted: Leqvio  (Inclisiran) J1306 Diagnosis Code:  Route of submission (phone, fax, portal):  Phone # Fax # Auth type: Buy/Bill hb Units/visits requested: 284mg , q74months Reference number:  Approval from: 03/18/24 to 08/13/24

## 2024-04-09 DIAGNOSIS — E210 Primary hyperparathyroidism: Secondary | ICD-10-CM | POA: Diagnosis not present

## 2024-04-09 DIAGNOSIS — E559 Vitamin D deficiency, unspecified: Secondary | ICD-10-CM | POA: Diagnosis not present

## 2024-04-11 LAB — COMPREHENSIVE METABOLIC PANEL WITH GFR
ALT: 15 IU/L (ref 0–44)
AST: 21 IU/L (ref 0–40)
Albumin: 4.3 g/dL (ref 3.8–4.8)
Alkaline Phosphatase: 98 IU/L (ref 44–121)
BUN/Creatinine Ratio: 14 (ref 10–24)
BUN: 14 mg/dL (ref 8–27)
Bilirubin Total: 0.6 mg/dL (ref 0.0–1.2)
CO2: 22 mmol/L (ref 20–29)
Calcium: 10 mg/dL (ref 8.6–10.2)
Chloride: 102 mmol/L (ref 96–106)
Creatinine, Ser: 0.99 mg/dL (ref 0.76–1.27)
Globulin, Total: 3 g/dL (ref 1.5–4.5)
Glucose: 94 mg/dL (ref 70–99)
Potassium: 5.2 mmol/L (ref 3.5–5.2)
Sodium: 138 mmol/L (ref 134–144)
Total Protein: 7.3 g/dL (ref 6.0–8.5)
eGFR: 79 mL/min/1.73 (ref >59)

## 2024-04-11 LAB — PHOSPHORUS: Phosphorus: 3.2 mg/dL (ref 2.8–4.1)

## 2024-04-11 LAB — VITAMIN D 25 HYDROXY (VIT D DEFICIENCY, FRACTURES): Vit D, 25-Hydroxy: 63.6 ng/mL (ref 30.0–100.0)

## 2024-04-11 LAB — PTH, INTACT AND CALCIUM: PTH: 64 pg/mL (ref 15–65)

## 2024-04-11 LAB — MAGNESIUM: Magnesium: 2.2 mg/dL (ref 1.6–2.3)

## 2024-04-16 ENCOUNTER — Ambulatory Visit (INDEPENDENT_AMBULATORY_CARE_PROVIDER_SITE_OTHER): Payer: Medicare Other | Admitting: "Endocrinology

## 2024-04-16 ENCOUNTER — Encounter: Payer: Self-pay | Admitting: "Endocrinology

## 2024-04-16 VITALS — BP 124/60 | HR 72 | Ht 69.0 in | Wt 161.0 lb

## 2024-04-16 DIAGNOSIS — R7303 Prediabetes: Secondary | ICD-10-CM

## 2024-04-16 DIAGNOSIS — E559 Vitamin D deficiency, unspecified: Secondary | ICD-10-CM

## 2024-04-16 DIAGNOSIS — E210 Primary hyperparathyroidism: Secondary | ICD-10-CM

## 2024-04-16 DIAGNOSIS — M8580 Other specified disorders of bone density and structure, unspecified site: Secondary | ICD-10-CM

## 2024-04-16 NOTE — Progress Notes (Signed)
 04/16/2024, 11:19 AM  Endocrinology follow-up note   Billy Hunt is a 78 y.o.-year-old male, who is returning for follow-up after he was seen in consultation for hypercalcemia/hyperparathyroidism, osteopenia. PMD:   [name], MD   Past Medical History:  Diagnosis Date   Acute coronary syndrome (HCC) 07/04/2021   Allergy 1976   Anxiety    BPH (benign prostatic hyperplasia)    Cataract 10/14/2023   Colon cancer screening 05/18/2023   Gout    Hypercholesterolemia    Hyperparathyroidism (HCC)    Hypertension    Myocardial infarction (HCC) 06/2021   Neuromuscular disorder (HCC) 2021   Osteoporosis 2021   Pulmonary emboli (HCC)    Vitamin D  deficiency    Vitamin D  deficiency disease 10/07/2019    Past Surgical History:  Procedure Laterality Date   CATARACT EXTRACTION     COLONOSCOPY WITH PROPOFOL  N/A 08/15/2023   Procedure: COLONOSCOPY WITH PROPOFOL ;  Surgeon: Eartha Angelia Sieving, MD;  [facility];  Service: Gastroenterology;  Laterality: N/A;  8:15AM;ASA 1   CORONARY STENT INTERVENTION N/A 07/04/2021   Procedure: CORONARY STENT INTERVENTION;  Surgeon: Mady Bruckner, MD;  [facility];  Service: Cardiovascular;  Laterality: N/A;   CORONARY STENT INTERVENTION N/A 07/06/2021   Procedure: CORONARY STENT INTERVENTION;  Surgeon: Burnard Debby LABOR, MD;  [facility];  Service: Cardiovascular;  Laterality: N/A;   LEFT HEART CATH AND CORONARY ANGIOGRAPHY N/A 07/04/2021   Procedure: LEFT HEART CATH AND CORONARY ANGIOGRAPHY;  Surgeon: Mady Bruckner, MD;  [facility];  Service: Cardiovascular;  Laterality: N/A;   PERIPHERAL VASCULAR THROMBECTOMY     TRANSURETHRAL RESECTION OF PROSTATE     VASECTOMY      Social History   Tobacco Use   Smoking status: Former    Current packs/day: 0.00    Average packs/day: 1 pack/day for 10.0 years (10.0 ttl pk-yrs)    Types: Cigarettes    Start date: 04/20/1961     Quit date: 04/21/1971    Years since quitting: 53.0   Smokeless tobacco: Never  Vaping Use   Vaping status: Never Used  Substance Use Topics   Alcohol use: No    Comment: former   Drug use: Yes    Frequency: 7.0 times per week    Types: Marijuana    Comment: daily, last use 08/13/2023    Family History  Problem Relation Age of Onset   Alzheimer's disease Mother        Vascular Dementia   Stroke Mother    Breast cancer Mother    Cancer Father        Multiple Myeloma   Hypertension Father    Deep vein thrombosis Brother    Heart disease Brother    Cancer Paternal Grandmother    Alcohol abuse Paternal Grandfather     Outpatient Encounter Medications as of 04/16/2024  Medication Sig   cholecalciferol (VITAMIN D3) 25 MCG (1000 UT) tablet Take 1,000 Units by mouth daily.   cinacalcet  (SENSIPAR ) 30 MG tablet Take 1 tablet (30 mg total) by mouth 2 (two) times daily with a meal.   clobetasol  cream (TEMOVATE ) 0.05 % Apply 1 Application topically 2 (two) times daily.   clopidogrel  (PLAVIX ) 75 MG tablet Take 1 tablet by mouth once daily   inclisiran (LEQVIO ) 284 MG/1.5ML SOSY injection Inject 1.5 mLs (284 mg total) into the skin every 6 (six) months. Every 6 months   nitroGLYCERIN  (  NITROSTAT ) 0.4 MG SL tablet Place 1 tablet (0.4 mg total) under the tongue every 5 (five) minutes x 3 doses as needed for chest pain.   rosuvastatin  (CRESTOR ) 5 MG tablet Take 1 tablet by mouth once daily   tamsulosin  (FLOMAX ) 0.4 MG CAPS capsule Take 1 capsule (0.4 mg total) by mouth 2 (two) times daily.   venlafaxine  XR (EFFEXOR -XR) 150 MG 24 hr capsule Take 1 capsule (150 mg total) by mouth daily.   No facility-administered encounter medications on file as of 04/16/2024.    Allergies  Allergen Reactions   Sulfa Antibiotics Hives   Sulfamethoxazole Rash     HPI  Billy Hunt reports that he was diagnosed with hypercalcemia approximately 7 years ago with blood test showing high PTH and high calcium .   He denies any history of nephrolithiasis, seizure disorder, cardiac dysrhythmias.  Denies any history of CKD.   -He however was found to have osteopenia in 2014.  His repeat bone density in [facility] on November 25, 2018 showed left femur osteopenia with a T score of -1.6, left forearm radius 33% T score of -0.2, spine excluded due to degenerative changes.   His bone density on November 26, 2020 shows similar findings still consistent with osteopenia. His most recent bone density in March 2024, showing worsening findings, still consistent with osteopenia.  -He is currently not on calcium  supplements, however on vitamin D3 1000 units daily.  His previsit labs show vitamin D  replete at 63, calcium  stable at 10 PTH stable at 64.   Patient remains on Sensipar  30 mg p.o. twice daily.  He continues to tolerate this medication.  He still wishes to avoid surgery.   -He denies any family history of parathyroid, thyroid , pituitary, adrenal dysfunction.    - prior to his last visit, 24-hour urine calcium  on November 22, 2018 was 221. He has no new complaints today.  No prior history of fragility fractures or falls.    He is a former  smoker.   he is not on HCTZ or other thiazide therapy.  She is on Leqvio  (inclisiran) 284 mg injection every 6 months, and Crestor  5 mg p.o. nightly for management of dyslipidemia.  His previsit labs show significant improvement in his lipid panel.    He reports previous intolerance to Lipitor .   BP 124/60   Pulse 72   Ht 5' 9 (1.753 m)   Wt 161 lb (73 kg)   BMI 23.78 kg/m , Body mass index is 23.78 kg/m.   CMP     Component Value Date/Time   NA 138 04/09/2024 0901   K 5.2 04/09/2024 0901   CL 102 04/09/2024 0901   CO2 22 04/09/2024 0901   GLUCOSE 94 04/09/2024 0901   GLUCOSE 137 (H) 07/05/2021 0320   BUN 14 04/09/2024 0901   CREATININE 0.99 04/09/2024 0901   CALCIUM  10.0 04/09/2024 0901   PROT 7.3 04/09/2024 0901   ALBUMIN 4.3 04/09/2024 0901   AST 21  04/09/2024 0901   ALT 15 04/09/2024 0901   ALKPHOS 98 04/09/2024 0901   BILITOT 0.6 04/09/2024 0901   GFRNONAA >60 07/05/2021 0320   GFRAA >60 07/27/2017 0100   Most recent official lab reports from September 17, 2018: 25-hydroxy vitamin D  43, PTH 76 elevated, calcium  11.4 mg per DL elevated, BUN 25, creatinine 0.98. -He came with separate handwritten summaries of his labs from 2014 showing PTH elevated between 89-1 67 between May and June 2014, 25 hydroxy vitamin D   ranging between 8.6-27 between June and August 2014, phosphorus range from 2.2-2.3 between May and August 2014, 24-hour urine calcium  192 mg in 24 hours in June 2014. DEXA scan showed mild osteopenia in June 2014.   Surveillance DEXA on November 25, 2018  DualFemur Neck Left 11/25/2018 70.4 N/A -1.6 0.868 g/cm2   Left Forearm Radius 33% 11/25/2018 70.4 N/A -0.2 0.787 g/cm2   ASSESSMENT: BMD as determined from Femur Neck Left is 0.868 g/cm2 with a T-Score of -1.6.   This patient is considered OSTEOPENIC according to the World Health Organization [facility]) criteria.   DualFemur Neck Left 12/06/2022 74.4 N/A -2.3 0.777 g/cm2 -9.3% Yes DualFemur Neck Left 11/26/2020 72.4 N/A -1.6 0.857 g/cm2 -1.3% - DualFemur Neck Left 11/25/2018 70.4 N/A -1.6 0.868 g/cm2 - -   DualFemur Total Mean 12/06/2022 74.4 N/A -1.8 0.843 g/cm2 -7.8% Yes DualFemur Total Mean 11/26/2020 72.4 N/A -1.3 0.914 g/cm2 -6.3% Yes DualFemur Total Mean 11/25/2018 70.4 N/A -0.9 0.975 g/cm2 - -   Left Forearm Radius 33% 12/06/2022 74.4 N/A -0.7 0.753 g/cm2 -2.0% - Left Forearm Radius 33% 11/26/2020 72.4 N/A -0.5 0.768 g/cm2 -2.3% - Left Forearm Radius 33% 11/25/2018 70.4 N/A -0.2 0.787 g/cm2 - - ASSESSMENT: BMD as determined from Femur Neck Left is 0.777 g/cm2 with a T-score of -2.3. This patient is considered osteopenic by World Healh Organization (WHO) Criteria.The scan quality is good. Compared with the prior study on 11/26/20, the BMD of the total mean shows  a statistically significant decrease. Lumbar spine was excluded due to advanced degenerative changes.    Recent Results (from the past 2160 hours)  PTH, intact and calcium      Status: None   Collection Time: 04/09/24  9:01 AM  Result Value Ref Range   PTH 64 15 - 65 pg/mL   PTH Interp Comment     Comment: Interpretation                 Intact PTH    Calcium                                  (pg/mL)      (mg/dL) Normal                          15 - 65     8.6 - 10.2 Primary Hyperparathyroidism         >65          >10.2 Secondary Hyperparathyroidism       >65          <10.2 Non-Parathyroid Hypercalcemia       <65          >10.2 Hypoparathyroidism                  <15          < 8.6 Non-Parathyroid Hypocalcemia    15 - 65          < 8.6   Magnesium     Status: None   Collection Time: 04/09/24  9:01 AM  Result Value Ref Range   Magnesium 2.2 1.6 - 2.3 mg/dL  Phosphorus     Status: None   Collection Time: 04/09/24  9:01 AM  Result Value Ref Range   Phosphorus 3.2 2.8 - 4.1 mg/dL  Comprehensive metabolic panel     Status: None   Collection Time: 04/09/24  9:01  AM  Result Value Ref Range   Glucose 94 70 - 99 mg/dL   BUN 14 8 - 27 mg/dL   Creatinine, Ser 9.00 0.76 - 1.27 mg/dL   eGFR 79 >40 fO/fpw/8.26   BUN/Creatinine Ratio 14 10 - 24   Sodium 138 134 - 144 mmol/L   Potassium 5.2 3.5 - 5.2 mmol/L   Chloride 102 96 - 106 mmol/L   CO2 22 20 - 29 mmol/L   Calcium  10.0 8.6 - 10.2 mg/dL   Total Protein 7.3 6.0 - 8.5 g/dL   Albumin 4.3 3.8 - 4.8 g/dL   Globulin, Total 3.0 1.5 - 4.5 g/dL   Bilirubin Total 0.6 0.0 - 1.2 mg/dL   Alkaline Phosphatase 98 44 - 121 IU/L   AST 21 0 - 40 IU/L   ALT 15 0 - 44 IU/L  VITAMIN D  25 Hydroxy (Vit-D Deficiency, Fractures)     Status: None   Collection Time: 04/09/24  9:01 AM  Result Value Ref Range   Vit D, 25-Hydroxy 63.6 30.0 - 100.0 ng/mL    Comment: Vitamin D  deficiency has been defined by the Institute of Medicine and an Endocrine Society  practice guideline as a level of serum 25-OH vitamin D  less than 20 ng/mL (1,2). The Endocrine Society went on to further define vitamin D  insufficiency as a level between 21 and 29 ng/mL (2). 1. IOM (Institute of Medicine). 2010. Dietary reference    intakes for calcium  and D. [place]: The    [employer]. 2. Holick MF, [place], Bischoff-Ferrari HA, et al.    Evaluation, treatment, and prevention of vitamin D     deficiency: an Endocrine Society clinical practice    guideline. JCEM. 2011 Jul; 96(7):1911-30.     Assessment: 1. Hypercalcemia / Hyperparathyroidism 2.  Osteopenia 3.  Hyperlipidemia  Plan: His previsit repeat labs show normocalcemia at 10 associated with PTH improved at 64.  - He is advised to continue Sensipar  30 mg p.o. daily-daily with breakfast and supper.     His last bone density was consistent with worsening finding, still consistent with osteopenia of femur.  His lumbar spine was excluded due to degenerative joint diseases. Patient has history of height loss of 1 inch.  His next bone density in March 2026 will include VFA. His prior 24-hour urine calcium  was 221.    This is still consistent with primary hyperparathyroidism at a mild form.   He still wishes to avoid surgery.  -He remains on low-dose vitamin D  supplement.  His current vitamin D  supplements is with vitamin D3 1000 units daily, advised to continue.     From the point of view of hyperlipidemia on the background of coronary artery disease, he remains on Leqvio  284 mg subcutaneously every 6 months and Crestor  5 mg p.o. nightly.  He is advised to continue.        -  he will continue to benefit from lifestyle medicine.  His most recent A1c was 5.6%.   -He is advised to continue his primary care follow-up with Dr. Tobie.    I spent  27  minutes in the care of the patient today including review of labs from Thyroid  Function, CMP, and other relevant labs ; imaging/biopsy records  (current and previous including abstractions from other facilities); face-to-face time discussing  his lab results and symptoms, medications doses, his options of short and long term treatment based on the latest standards of care / guidelines;   and documenting the encounter.  Aleene ONEIDA Relic  participated in the discussions, expressed understanding, and voiced agreement with the above plans.  All questions were answered to his satisfaction. he is encouraged to contact clinic should he have any questions or concerns prior to his return visit.   - Return in about 7 months (around 11/14/2024) for F/U with Pre-visit Labs, A1c -NV, DXA Scan B4 NV.   Ranny Earl, MD [facility] [address] [place], [place] [address] Phone: [phone]  Fax: [phone]    This note was partially dictated with voice recognition software. Similar sounding words can be transcribed inadequately or may not  be corrected upon review.  04/16/2024, 11:19 AM

## 2024-04-21 ENCOUNTER — Encounter: Attending: Internal Medicine | Admitting: *Deleted

## 2024-04-21 VITALS — BP 158/70 | HR 61 | Temp 97.7°F

## 2024-04-21 DIAGNOSIS — I25110 Atherosclerotic heart disease of native coronary artery with unstable angina pectoris: Secondary | ICD-10-CM | POA: Insufficient documentation

## 2024-04-21 DIAGNOSIS — E782 Mixed hyperlipidemia: Secondary | ICD-10-CM | POA: Insufficient documentation

## 2024-04-21 MED ORDER — INCLISIRAN SODIUM 284 MG/1.5ML ~~LOC~~ SOSY
284.0000 mg | PREFILLED_SYRINGE | Freq: Once | SUBCUTANEOUS | Status: AC
Start: 2024-04-21 — End: 2024-04-21
  Administered 2024-04-21 (×2): 284 mg via SUBCUTANEOUS

## 2024-04-21 NOTE — Progress Notes (Signed)
 Diagnosis: Hyperlipidemia  Provider:  Vina Snook, MD  Procedure: Injection  Leqvio  (inclisiran), Dose: 284 mg, Site: subcutaneous, Number of injections: 1  Injection Site(s): Left upper quad. abdomen  Post Care: Observation period completed  Discharge: Condition: Good, Destination: Home . AVS Provided  Performed by:  Baldwin Darice Helling, RN

## 2024-04-28 DIAGNOSIS — H2512 Age-related nuclear cataract, left eye: Secondary | ICD-10-CM | POA: Diagnosis not present

## 2024-04-28 DIAGNOSIS — H5202 Hypermetropia, left eye: Secondary | ICD-10-CM | POA: Diagnosis not present

## 2024-04-28 DIAGNOSIS — H52209 Unspecified astigmatism, unspecified eye: Secondary | ICD-10-CM | POA: Diagnosis not present

## 2024-04-28 DIAGNOSIS — H52222 Regular astigmatism, left eye: Secondary | ICD-10-CM | POA: Diagnosis not present

## 2024-04-29 DIAGNOSIS — H2511 Age-related nuclear cataract, right eye: Secondary | ICD-10-CM | POA: Diagnosis not present

## 2024-05-17 ENCOUNTER — Other Ambulatory Visit: Payer: Self-pay | Admitting: Internal Medicine

## 2024-05-19 DIAGNOSIS — H20041 Secondary noninfectious iridocyclitis, right eye: Secondary | ICD-10-CM | POA: Diagnosis not present

## 2024-05-19 DIAGNOSIS — H5203 Hypermetropia, bilateral: Secondary | ICD-10-CM | POA: Diagnosis not present

## 2024-05-19 DIAGNOSIS — H52209 Unspecified astigmatism, unspecified eye: Secondary | ICD-10-CM | POA: Diagnosis not present

## 2024-05-19 DIAGNOSIS — H2511 Age-related nuclear cataract, right eye: Secondary | ICD-10-CM | POA: Diagnosis not present

## 2024-05-19 DIAGNOSIS — H52222 Regular astigmatism, left eye: Secondary | ICD-10-CM | POA: Diagnosis not present

## 2024-05-26 ENCOUNTER — Other Ambulatory Visit

## 2024-05-26 DIAGNOSIS — R972 Elevated prostate specific antigen [PSA]: Secondary | ICD-10-CM | POA: Diagnosis not present

## 2024-05-27 LAB — PSA: Prostate Specific Ag, Serum: 8.2 ng/mL — ABNORMAL HIGH (ref 0.0–4.0)

## 2024-05-28 ENCOUNTER — Ambulatory Visit (INDEPENDENT_AMBULATORY_CARE_PROVIDER_SITE_OTHER): Admitting: Internal Medicine

## 2024-05-28 ENCOUNTER — Encounter: Payer: Self-pay | Admitting: Internal Medicine

## 2024-05-28 VITALS — BP 130/70 | HR 87 | Ht 69.0 in | Wt 154.2 lb

## 2024-05-28 DIAGNOSIS — M542 Cervicalgia: Secondary | ICD-10-CM | POA: Diagnosis not present

## 2024-05-28 DIAGNOSIS — R410 Disorientation, unspecified: Secondary | ICD-10-CM | POA: Insufficient documentation

## 2024-05-28 DIAGNOSIS — F411 Generalized anxiety disorder: Secondary | ICD-10-CM | POA: Diagnosis not present

## 2024-05-28 DIAGNOSIS — R972 Elevated prostate specific antigen [PSA]: Secondary | ICD-10-CM

## 2024-05-28 DIAGNOSIS — I25110 Atherosclerotic heart disease of native coronary artery with unstable angina pectoris: Secondary | ICD-10-CM | POA: Diagnosis not present

## 2024-05-28 DIAGNOSIS — Z23 Encounter for immunization: Secondary | ICD-10-CM

## 2024-05-28 DIAGNOSIS — I10 Essential (primary) hypertension: Secondary | ICD-10-CM | POA: Diagnosis not present

## 2024-05-28 DIAGNOSIS — E782 Mixed hyperlipidemia: Secondary | ICD-10-CM

## 2024-05-28 DIAGNOSIS — E210 Primary hyperparathyroidism: Secondary | ICD-10-CM

## 2024-05-28 MED ORDER — SPIKEVAX 50 MCG/0.5ML IM SUSY: 0.5000 mL | PREFILLED_SYRINGE | Freq: Once | INTRAMUSCULAR | 0 refills | Status: AC

## 2024-05-28 MED ORDER — PREDNISONE 20 MG PO TABS: 20.0000 mg | ORAL_TABLET | Freq: Every day | ORAL | 0 refills | Status: DC

## 2024-05-28 NOTE — Assessment & Plan Note (Signed)
 Since 05/03/24, after cataract surgery, unclear if related to muscular strain during or after procedure while lying down Unable to NSAIDs as he takes Plavix  Started oral Prednisone  20 mg once daily X 5 days Heating pad PRN

## 2024-05-28 NOTE — Assessment & Plan Note (Addendum)
 BP Readings from Last 1 Encounters:  05/28/24 130/70   Well-controlled at home with diet alone Discontinued Losartan  and Coreg  in previous visit due to low BP at home Advised DASH diet and moderate exercise/walking, at least 150 mins/week

## 2024-05-28 NOTE — Assessment & Plan Note (Signed)
 S/p PCI X 2 (10/22) On Plavix and Leqvio No chest pain or dyspnea currently Followed by Cardiology - last visit note reviewed

## 2024-05-28 NOTE — Assessment & Plan Note (Signed)
 Had acute changes in mental status after the procedure, could be related to marijuana use and anesthesia related to gastric surgery Advised to avoid marijuana products Maintain adequate hydration and eat at regular intervals Since the mental status has improved now, would avoid any further investigation at this point

## 2024-05-28 NOTE — Assessment & Plan Note (Signed)
Followed by urology Has had TURP On Flomax for BPH

## 2024-05-28 NOTE — Patient Instructions (Signed)
 Please start taking prednisone  as prescribed for neck pain.  Please use heating pad as needed for neck pain.  Please continue to take medications as prescribed.  Please continue to follow low salt diet and perform moderate exercise/walking at least 150 mins/week.

## 2024-05-28 NOTE — Assessment & Plan Note (Signed)
Stable On Cinacalcet Followed by Dr Nida 

## 2024-05-28 NOTE — Assessment & Plan Note (Signed)
 Well-controlled with Effexor 150 mg QD

## 2024-05-28 NOTE — Progress Notes (Signed)
 Established Patient Office Visit  Subjective:  Patient ID: Billy Hunt, male    DOB: Jan 17, 1946  Age: 78 y.o. MRN: [id number]  CC:  Chief Complaint  Patient presents with   Anxiety    6 month f/u    Medical Management of Chronic Issues    6 month f/u   Neck Pain    Reports sx of neck pain since cataract surgery on and off. Has had delirium associated with this as well.    HPI Billy Hunt is a 78 y.o. male with past medical history of CAD s/p stent placement, hyperparathyroidism, osteopenia, BPH, PE, HLD, gout and anxiety who presents for f/u of his chronic medical conditions.  CAD and HTN:  BP is well-controlled. Patient denies headache, dizziness, chest pain, dyspnea or palpitations. He is on Plavix  currently. He is in research study with Leqvio  for HLD and is tolerating it well. His liver enzymes were elevated with statin, which have trended down since stopping it.  BPH: Currently well-controlled with Flomax .  His urinary hesitancy has improved with Flomax  twice daily.  His PSA was higher compared to prior, followed by urology.  Denies any dysuria or hematuria currently.  Hyperparathyroidism: He takes Cinacalcet  for it.  His calcium  level was WNL.  Neck pain: He reports neck pain for the last 3 weeks, which started after his cataract surgery.  Pain is constant, dull, worse with neck movement and better with rest.  He has a history of DDD of cervical and lumbar spine.  He also reports having confusion spells after the surgery, when he felt lightheaded and agitated at times.  Of note, he reports using marijuana 2 days prior to the procedure.  Past Medical History:  Diagnosis Date   Acute coronary syndrome (HCC) 07/04/2021   Allergy 1976   Anxiety    BPH (benign prostatic hyperplasia)    Cataract 10/14/2023   Colon cancer screening 05/18/2023   Gout    Hypercholesterolemia    Hyperparathyroidism (HCC)    Hypertension    Myocardial infarction (HCC) 06/2021    Neuromuscular disorder (HCC) 2021   Osteoporosis 2021   Pulmonary emboli (HCC)    Vitamin D  deficiency    Vitamin D  deficiency disease 10/07/2019    Past Surgical History:  Procedure Laterality Date   CATARACT EXTRACTION     COLONOSCOPY WITH PROPOFOL  N/A 08/15/2023   Procedure: COLONOSCOPY WITH PROPOFOL ;  Surgeon: Eartha Angelia Sieving, MD;  [facility];  Service: Gastroenterology;  Laterality: N/A;  8:15AM;ASA 1   CORONARY STENT INTERVENTION N/A 07/04/2021   Procedure: CORONARY STENT INTERVENTION;  Surgeon: Mady Bruckner, MD;  [facility];  Service: Cardiovascular;  Laterality: N/A;   CORONARY STENT INTERVENTION N/A 07/06/2021   Procedure: CORONARY STENT INTERVENTION;  Surgeon: Burnard Debby LABOR, MD;  [facility]: [facility];  Service: Cardiovascular;  Laterality: N/A;   LEFT HEART CATH AND CORONARY ANGIOGRAPHY N/A 07/04/2021   Procedure: LEFT HEART CATH AND CORONARY ANGIOGRAPHY;  Surgeon: Mady Bruckner, MD;  [facility];  Service: Cardiovascular;  Laterality: N/A;   PERIPHERAL VASCULAR THROMBECTOMY     TRANSURETHRAL RESECTION OF PROSTATE     VASECTOMY      Family History  Problem Relation Age of Onset   Alzheimer's disease Mother        Vascular Dementia   Stroke Mother    Breast cancer Mother    Cancer Father        Multiple Myeloma  Hypertension Father    Deep vein thrombosis Brother    Heart disease Brother    Cancer Paternal Grandmother    Alcohol abuse Paternal Grandfather     Social History   Socioeconomic History   Marital status: Divorced    Spouse name: Not on file   Number of children: 2   Years of education: 12   Highest education level: Master's degree (e.g., MA, MS, MEng, MEd, MSW, MBA)  Occupational History   Not on file  Tobacco Use   Smoking status: Former    Current packs/day: 0.00    Average packs/day: 1 pack/day for 10.0 years (10.0 ttl pk-yrs)    Types: Cigarettes    Start date:  04/20/1961    Quit date: 04/21/1971    Years since quitting: 53.1   Smokeless tobacco: Never  Vaping Use   Vaping status: Never Used  Substance and Sexual Activity   Alcohol use: No    Comment: former   Drug use: Yes    Frequency: 7.0 times per week    Types: Marijuana    Comment: daily, last use 08/13/2023   Sexual activity: Yes    Birth control/protection: Other-see comments  Other Topics Concern   Not on file  Social History Narrative   Not on file   Social Drivers of Health   Financial Resource Strain: Low Risk  (05/24/2024)   Overall Financial Resource Strain (CARDIA)    Difficulty of Paying Living Expenses: Not hard at all  Food Insecurity: No Food Insecurity (05/24/2024)   Hunger Vital Sign    Worried About Running Out of Food in the Last Year: Never true    Ran Out of Food in the Last Year: Never true  Transportation Needs: No Transportation Needs (05/24/2024)   PRAPARE - Administrator, Civil Service (Medical): No    Lack of Transportation (Non-Medical): No  Physical Activity: Sufficiently Active (05/24/2024)   Exercise Vital Sign    Days of Exercise per Week: 4 days    Minutes of Exercise per Session: 60 min  Stress: No Stress Concern Present (05/24/2024)   [employer] of Occupational Health - Occupational Stress Questionnaire    Feeling of Stress: Only a little  Social Connections: Moderately Isolated (05/24/2024)   Social Connection and Isolation Panel    Frequency of Communication with Friends and Family: More than three times a week    Frequency of Social Gatherings with Friends and Family: Once a week    Attends Religious Services: Never    Database administrator or Organizations: No    Attends Engineer, structural: Not on file    Marital Status: Living with partner  Intimate Partner Violence: Not At Risk (10/09/2023)   Humiliation, Afraid, Rape, and Kick questionnaire    Fear of Current or Ex-Partner: No    Emotionally Abused: No     Physically Abused: No    Sexually Abused: No    Outpatient Medications Prior to Visit  Medication Sig Dispense Refill   cholecalciferol (VITAMIN D3) 25 MCG (1000 UT) tablet Take 1,000 Units by mouth daily.     cinacalcet  (SENSIPAR ) 30 MG tablet Take 1 tablet (30 mg total) by mouth 2 (two) times daily with a meal. 180 tablet 1   clobetasol  cream (TEMOVATE ) 0.05 % Apply 1 Application topically 2 (two) times daily. 30 g 0   clopidogrel  (PLAVIX ) 75 MG tablet Take 1 tablet by mouth once daily 90 tablet 2   inclisiran (LEQVIO )  284 MG/1.5ML SOSY injection Inject 1.5 mLs (284 mg total) into the skin every 6 (six) months. Every 6 months     nitroGLYCERIN  (NITROSTAT ) 0.4 MG SL tablet Place 1 tablet (0.4 mg total) under the tongue every 5 (five) minutes x 3 doses as needed for chest pain. 25 tablet 2   rosuvastatin  (CRESTOR ) 5 MG tablet TAKE 1 TABLET BY MOUTH ONCE DAILY. PLEASE KEEP PENDING APPT WITH CARDIOLOGIST 12/27 FOR FURTHER REFILLS. 90 tablet 0   tamsulosin  (FLOMAX ) 0.4 MG CAPS capsule Take 1 capsule (0.4 mg total) by mouth 2 (two) times daily. 180 capsule 3   venlafaxine  XR (EFFEXOR -XR) 150 MG 24 hr capsule Take 1 capsule (150 mg total) by mouth daily. 90 capsule 3   No facility-administered medications prior to visit.    Allergies  Allergen Reactions   Sulfa Antibiotics Hives   Sulfamethoxazole Rash    ROS Review of Systems  Constitutional:  Negative for chills and fever.  HENT:  Negative for congestion and sore throat.   Eyes:  Negative for pain and discharge.  Respiratory:  Negative for cough and shortness of breath.   Cardiovascular:  Negative for chest pain and palpitations.  Gastrointestinal:  Negative for diarrhea, nausea and vomiting.  Endocrine: Negative for polydipsia and polyuria.  Genitourinary:  Negative for dysuria and hematuria.  Musculoskeletal:  Positive for back pain and neck pain. Negative for neck stiffness.       Right shoulder pain  Skin:  Negative for rash.   Neurological:  Negative for dizziness, weakness and headaches.  Psychiatric/Behavioral:  Negative for agitation and behavioral problems.       Objective:    Physical Exam Vitals reviewed.  Constitutional:      General: He is not in acute distress.    Appearance: He is not diaphoretic.  HENT:     Head: Normocephalic and atraumatic.     Nose: Nose normal.     Mouth/Throat:     Mouth: Mucous membranes are moist.  Eyes:     General: No scleral icterus.    Extraocular Movements: Extraocular movements intact.  Cardiovascular:     Rate and Rhythm: Normal rate and regular rhythm.     Heart sounds: Normal heart sounds. No murmur heard. Pulmonary:     Breath sounds: Normal breath sounds. No wheezing or rales.  Musculoskeletal:     Right shoulder: Tenderness present. Decreased range of motion.     Cervical back: Neck supple. Tenderness present. Pain with movement present.     Right lower leg: No edema.     Left lower leg: No edema.  Skin:    General: Skin is warm.     Findings: No rash.  Neurological:     General: No focal deficit present.     Mental Status: He is alert and oriented to person, place, and time.  Psychiatric:        Mood and Affect: Mood normal.        Behavior: Behavior normal.     BP 130/70 (BP Location: Right Arm) Comment: Home  Pulse 87   Ht 5' 9 (1.753 m)   Wt 154 lb 3.2 oz (69.9 kg)   SpO2 93%   BMI 22.77 kg/m  Wt Readings from Last 3 Encounters:  05/28/24 154 lb 3.2 oz (69.9 kg)  04/16/24 161 lb (73 kg)  03/06/24 157 lb 12.8 oz (71.6 kg)    Lab Results  Component Value Date   TSH 2.080 10/23/2023   Lab Results  Component Value Date   WBC 7.0 10/23/2023   HGB 14.4 10/23/2023   HCT 43.2 10/23/2023   MCV 96 10/23/2023   PLT 184 10/23/2023   Lab Results  Component Value Date   NA 138 04/09/2024   K 5.2 04/09/2024   CO2 22 04/09/2024   GLUCOSE 94 04/09/2024   BUN 14 04/09/2024   CREATININE 0.99 04/09/2024   BILITOT 0.6 04/09/2024    ALKPHOS 98 04/09/2024   AST 21 04/09/2024   ALT 15 04/09/2024   PROT 7.3 04/09/2024   ALBUMIN 4.3 04/09/2024   CALCIUM  10.0 04/09/2024   ANIONGAP 9 07/05/2021   EGFR 79 04/09/2024   Lab Results  Component Value Date   CHOL 124 10/11/2023   Lab Results  Component Value Date   HDL 63 10/11/2023   Lab Results  Component Value Date   LDLCALC 46 10/11/2023   Lab Results  Component Value Date   TRIG 76 10/11/2023   Lab Results  Component Value Date   CHOLHDL 2.0 10/11/2023   Lab Results  Component Value Date   HGBA1C 5.6 05/10/2022      Assessment & Plan:   Problem List Items Addressed This Visit       Cardiovascular and Mediastinum   HTN (hypertension) - Primary   BP Readings from Last 1 Encounters:  05/28/24 130/70   Well-controlled at home with diet alone Discontinued Losartan  and Coreg  in previous visit due to low BP at home Advised DASH diet and moderate exercise/walking, at least 150 mins/week      Coronary artery disease involving native coronary artery of native heart with unstable angina pectoris (HCC)   S/p PCI X 2 (10/22) On Plavix  and Leqvio  No chest pain or dyspnea currently Followed by Cardiology - last visit note reviewed        Endocrine   Hyperparathyroidism, primary (HCC)   Stable On Cinacalcet  Followed by Dr Lenis Parisian and Auditory   Delirium   Had acute changes in mental status after the procedure, could be related to marijuana use and anesthesia related to gastric surgery Advised to avoid marijuana products Maintain adequate hydration and eat at regular intervals Since the mental status has improved now, would avoid any further investigation at this point        Other   Mixed hyperlipidemia (Chronic)   On Crestor  5 mg QD On Leqvio  currently Followed by Cardiology      GAD (generalized anxiety disorder) (Chronic)   Well-controlled with Effexor  150 mg QD      Elevated PSA   Followed by urology Has had  TURP On Flomax  for BPH      Neck pain   Since 05/03/24, after cataract surgery, unclear if related to muscular strain during or after procedure while lying down Unable to NSAIDs as he takes Plavix  Started oral Prednisone  20 mg once daily X 5 days Heating pad PRN       Relevant Medications   predniSONE  (DELTASONE ) 20 MG tablet   Other Visit Diagnoses       Encounter for immunization       Relevant Orders   Flu vaccine HIGH DOSE PF(Fluzone Trivalent) (Completed)     Need for COVID-19 vaccine       Relevant Medications   COVID-19 mRNA vaccine (SPIKEVAX ) syringe         Meds ordered this encounter  Medications   predniSONE  (DELTASONE ) 20 MG tablet    Sig: Take  1 tablet (20 mg total) by mouth daily with breakfast.    Dispense:  5 tablet    Refill:  0   COVID-19 mRNA vaccine (SPIKEVAX ) syringe    Sig: Inject 0.5 mLs into the muscle once for 1 dose.    Dispense:  0.5 mL    Refill:  0    OKAY TO ADMINISTER PFIZER IF MODERNA IS NO AVAILABLE.    Follow-up: Return in about 6 months (around 11/25/2024).    Suzzane MARLA Blanch, MD

## 2024-05-28 NOTE — Assessment & Plan Note (Signed)
 On Crestor  5 mg QD On Leqvio  currently Followed by Cardiology

## 2024-05-30 ENCOUNTER — Ambulatory Visit (INDEPENDENT_AMBULATORY_CARE_PROVIDER_SITE_OTHER): Admitting: Urology

## 2024-05-30 ENCOUNTER — Encounter: Payer: Self-pay | Admitting: Urology

## 2024-05-30 VITALS — BP 182/54 | HR 86

## 2024-05-30 DIAGNOSIS — N138 Other obstructive and reflux uropathy: Secondary | ICD-10-CM | POA: Diagnosis not present

## 2024-05-30 DIAGNOSIS — R972 Elevated prostate specific antigen [PSA]: Secondary | ICD-10-CM

## 2024-05-30 DIAGNOSIS — R339 Retention of urine, unspecified: Secondary | ICD-10-CM | POA: Diagnosis not present

## 2024-05-30 DIAGNOSIS — N401 Enlarged prostate with lower urinary tract symptoms: Secondary | ICD-10-CM | POA: Diagnosis not present

## 2024-05-30 DIAGNOSIS — Z23 Encounter for immunization: Secondary | ICD-10-CM | POA: Diagnosis not present

## 2024-05-30 LAB — MICROSCOPIC EXAMINATION
Bacteria, UA: NONE SEEN
Epithelial Cells (non renal): NONE SEEN /HPF (ref 0–10)
WBC, UA: NONE SEEN /HPF (ref 0–5)

## 2024-05-30 LAB — URINALYSIS, ROUTINE W REFLEX MICROSCOPIC
Bilirubin, UA: NEGATIVE
Glucose, UA: NEGATIVE
Ketones, UA: NEGATIVE
Leukocytes,UA: NEGATIVE
Nitrite, UA: NEGATIVE
Protein,UA: NEGATIVE
Specific Gravity, UA: 1.015 (ref 1.005–1.030)
Urobilinogen, Ur: 0.2 mg/dL (ref 0.2–1.0)
pH, UA: 6 (ref 5.0–7.5)

## 2024-05-30 MED ORDER — TAMSULOSIN HCL 0.4 MG PO CAPS: 0.4000 mg | ORAL_CAPSULE | Freq: Two times a day (BID) | ORAL | 3 refills | Status: AC

## 2024-05-30 NOTE — Patient Instructions (Signed)

## 2024-05-30 NOTE — Progress Notes (Signed)
 05/30/2024 9:53 AM   Billy Hunt Dec 26, 1945 [id number]  Referring provider: [name], MD [address] [place],  [place] [address]  Followup elevated PSA and BPH   HPI: Billy Hunt is a 77yo here for followup for BPh and elevated PSA. PSA stable at 8.2. IPSS 9 QOL on flomax  0.4mg  BID. Nocturia stable at 2-3x. Urine stream strong. No straining to urinate. He is on a steroid taper for pain in his cervical spine.    PMH: Past Medical History:  Diagnosis Date   Acute coronary syndrome (HCC) 07/04/2021   Allergy 1976   Anxiety    BPH (benign prostatic hyperplasia)    Cataract 10/14/2023   Colon cancer screening 05/18/2023   Gout    Hypercholesterolemia    Hyperparathyroidism (HCC)    Hypertension    Myocardial infarction (HCC) 06/2021   Neuromuscular disorder (HCC) 2021   Osteoporosis 2021   Pulmonary emboli (HCC)    Vitamin D  deficiency    Vitamin D  deficiency disease 10/07/2019    Surgical History: Past Surgical History:  Procedure Laterality Date   CATARACT EXTRACTION     COLONOSCOPY WITH PROPOFOL  N/A 08/15/2023   Procedure: COLONOSCOPY WITH PROPOFOL ;  Surgeon: Eartha Angelia Sieving, MD;  [facility];  Service: Gastroenterology;  Laterality: N/A;  8:15AM;ASA 1   CORONARY STENT INTERVENTION N/A 07/04/2021   Procedure: CORONARY STENT INTERVENTION;  Surgeon: Mady Bruckner, MD;  [facility];  Service: Cardiovascular;  Laterality: N/A;   CORONARY STENT INTERVENTION N/A 07/06/2021   Procedure: CORONARY STENT INTERVENTION;  Surgeon: Burnard Debby LABOR, MD;  [facility];  Service: Cardiovascular;  Laterality: N/A;   LEFT HEART CATH AND CORONARY ANGIOGRAPHY N/A 07/04/2021   Procedure: LEFT HEART CATH AND CORONARY ANGIOGRAPHY;  Surgeon: Mady Bruckner, MD;  [facility];  Service: Cardiovascular;  Laterality: N/A;   PERIPHERAL VASCULAR THROMBECTOMY     TRANSURETHRAL RESECTION OF PROSTATE      VASECTOMY      Home Medications:  Allergies as of 05/30/2024       Reactions   Sulfa Antibiotics Hives   Sulfamethoxazole Rash        Medication List        Accurate as of May 30, 2024  9:53 AM. If you have any questions, ask your nurse or doctor.          cholecalciferol 25 MCG (1000 UNIT) tablet Commonly known as: VITAMIN D3 Take 1,000 Units by mouth daily.   cinacalcet  30 MG tablet Commonly known as: SENSIPAR  Take 1 tablet (30 mg total) by mouth 2 (two) times daily with a meal.   clobetasol  cream 0.05 % Commonly known as: TEMOVATE  Apply 1 Application topically 2 (two) times daily.   clopidogrel  75 MG tablet Commonly known as: PLAVIX  Take 1 tablet by mouth once daily   Leqvio  284 MG/1.5ML Sosy injection Generic drug: inclisiran Inject 1.5 mLs (284 mg total) into the skin every 6 (six) months. Every 6 months   nitroGLYCERIN  0.4 MG SL tablet Commonly known as: NITROSTAT  Place 1 tablet (0.4 mg total) under the tongue every 5 (five) minutes x 3 doses as needed for chest pain.   predniSONE  20 MG tablet Commonly known as: DELTASONE  Take 1 tablet (20 mg total) by mouth daily with breakfast.   rosuvastatin  5 MG tablet Commonly known as: CRESTOR  TAKE 1 TABLET BY MOUTH ONCE DAILY. PLEASE KEEP PENDING APPT WITH CARDIOLOGIST 12/27 FOR FURTHER REFILLS.  tamsulosin  0.4 MG Caps capsule Commonly known as: FLOMAX  Take 1 capsule (0.4 mg total) by mouth 2 (two) times daily.   venlafaxine  XR 150 MG 24 hr capsule Commonly known as: EFFEXOR -XR Take 1 capsule (150 mg total) by mouth daily.        Allergies:  Allergies  Allergen Reactions   Sulfa Antibiotics Hives   Sulfamethoxazole Rash    Family History: Family History  Problem Relation Age of Onset   Alzheimer's disease Mother        Vascular Dementia   Stroke Mother    Breast cancer Mother    Cancer Father        Multiple Myeloma   Hypertension Father    Deep vein thrombosis Brother    Heart  disease Brother    Cancer Paternal Grandmother    Alcohol abuse Paternal Grandfather     Social History:  reports that he quit smoking about 53 years ago. His smoking use included cigarettes. He started smoking about 63 years ago. He has a 10 pack-year smoking history. He has never used smokeless tobacco. He reports current drug use. Frequency: 7.00 times per week. Drug: Marijuana. He reports that he does not drink alcohol.  ROS: All other review of systems were reviewed and are negative except what is noted above in HPI  Physical Exam: BP (!) 182/54   Pulse 86   Constitutional:  Alert and oriented, No acute distress. HEENT: [place] AT, moist mucus membranes.  Trachea midline, no masses. Cardiovascular: No clubbing, cyanosis, or edema. Respiratory: Normal respiratory effort, no increased work of breathing. GI: Abdomen is soft, nontender, nondistended, no abdominal masses GU: No CVA tenderness.  Lymph: No cervical or inguinal lymphadenopathy. Skin: No rashes, bruises or suspicious lesions. Neurologic: Grossly intact, no focal deficits, moving all 4 extremities. Psychiatric: Normal mood and affect.  Laboratory Data: Lab Results  Component Value Date   WBC 7.0 10/23/2023   HGB 14.4 10/23/2023   HCT 43.2 10/23/2023   MCV 96 10/23/2023   PLT 184 10/23/2023    Lab Results  Component Value Date   CREATININE 0.99 04/09/2024    No results found for: PSA  No results found for: TESTOSTERONE   Lab Results  Component Value Date   HGBA1C 5.6 05/10/2022    Urinalysis    Component Value Date/Time   COLORURINE YELLOW 06/12/2015 0855   APPEARANCEUR Clear 11/28/2023 0919   LABSPEC 1.010 06/12/2015 0855   PHURINE 6.0 06/12/2015 0855   GLUCOSEU Negative 11/28/2023 0919   HGBUR LARGE (A) 06/12/2015 0855   BILIRUBINUR Negative 11/28/2023 0919   KETONESUR small (15) (A) 12/01/2021 0920   KETONESUR NEGATIVE 06/12/2015 0855   PROTEINUR Negative 11/28/2023 0919   PROTEINUR NEGATIVE  06/12/2015 0855   UROBILINOGEN 4.0 (A) 12/01/2021 0920   UROBILINOGEN 0.2 06/12/2015 0855   NITRITE Negative 11/28/2023 0919   NITRITE NEGATIVE 06/12/2015 0855   LEUKOCYTESUR Negative 11/28/2023 0919    Lab Results  Component Value Date   LABMICR See below: 11/28/2023   WBCUA 0-5 11/28/2023   LABEPIT 0-10 11/28/2023   MUCUS Present 05/02/2022   BACTERIA None seen 11/28/2023    Pertinent Imaging:  No results found for this or any previous visit.  No results found for this or any previous visit.  No results found for this or any previous visit.  No results found for this or any previous visit.  No results found for this or any previous visit.  No results found for this or any previous  visit.  Results for orders placed during the hospital encounter of 12/09/21  CT HEMATURIA WORKUP  Narrative CLINICAL DATA:  Gross hematuria for 2 weeks, elevated PSA, history of BPH and TURP  EXAM: CT ABDOMEN AND PELVIS WITHOUT AND WITH CONTRAST  TECHNIQUE: Multidetector CT imaging of the abdomen and pelvis was performed following the standard protocol before and following the bolus administration of intravenous contrast.  RADIATION DOSE REDUCTION: This exam was performed according to the departmental dose-optimization program which includes automated exposure control, adjustment of the mA and/or kV according to patient size and/or use of iterative reconstruction technique.  CONTRAST:  OMNIPAQUE  IOHEXOL  300 MG/ML  SOLN  COMPARISON:  None.  FINDINGS: Lower chest: No acute abnormality. Eventration of the posterior right hemidiaphragm with associated scarring and atelectasis. Coronary artery calcifications. Small hiatal hernia.  Hepatobiliary: No solid liver abnormality is seen. No gallstones, gallbladder wall thickening, or biliary dilatation.  Pancreas: Unremarkable. No pancreatic ductal dilatation or surrounding inflammatory changes.  Spleen: Normal in size without  significant abnormality.  Adrenals/Urinary Tract: Adrenal glands are unremarkable. The right kidney is in a very high position due to eventration of the right hemidiaphragm. Punctuate nonobstructive calculi of the superior pole of the right kidney (series 3, image 6). No left-sided calculi, ureteral calculi, or hydronephrosis. No suspicious mass or contrast enhancement. Simple, benign cyst of the posterior midportion of the left kidney, for which no further follow-up or characterization is required. No urinary tract filling defect on delayed phase imaging. Bladder is unremarkable.  Stomach/Bowel: Stomach is within normal limits. Appendix appears normal. No evidence of bowel wall thickening, distention, or inflammatory changes. Descending and sigmoid diverticulosis. Large burden of stool throughout the colon and rectum.  Vascular/Lymphatic: Aortic atherosclerosis. No enlarged abdominal or pelvic lymph nodes.  Reproductive: Severe prostatomegaly with TURP defect.  Other: No abdominal wall hernia or abnormality. No ascites.  Musculoskeletal: No acute or significant osseous findings.  IMPRESSION: 1. Punctuate nonobstructive calculi of the superior pole of the right kidney. No left-sided calculi, ureteral calculi, or hydronephrosis. 2. No suspicious mass or contrast enhancement. No urinary tract filling defect on delayed phase imaging. 3. Severe prostatomegaly with TURP defect. 4. Descending and sigmoid diverticulosis without evidence of acute diverticulitis. 5. Coronary artery disease.  Aortic Atherosclerosis (ICD10-I70.0).   Electronically Signed By: Marolyn JONETTA Jaksch M.D. On: 12/10/2021 11:00  No results found for this or any previous visit.   Assessment & Plan:    1. Elevated PSA (Primary) Followup 6 months with PSA - Urinalysis, Routine w reflex microscopic  2. BPH with obstruction/lower urinary tract symptoms -continue flomax  0.4mg  BID - Urinalysis, Routine w  reflex microscopic  3. Incomplete bladder emptying Continue flomax  0.4mg  BID   No follow-ups on file.  Belvie Clara, MD  [facility] [place]

## 2024-06-11 ENCOUNTER — Other Ambulatory Visit: Payer: Self-pay | Admitting: Internal Medicine

## 2024-06-25 ENCOUNTER — Encounter (INDEPENDENT_AMBULATORY_CARE_PROVIDER_SITE_OTHER): Payer: Self-pay | Admitting: Gastroenterology

## 2024-07-28 ENCOUNTER — Other Ambulatory Visit: Payer: Self-pay | Admitting: "Endocrinology

## 2024-08-14 ENCOUNTER — Other Ambulatory Visit: Payer: Self-pay | Admitting: Internal Medicine

## 2024-08-22 ENCOUNTER — Encounter (INDEPENDENT_AMBULATORY_CARE_PROVIDER_SITE_OTHER): Payer: Self-pay | Admitting: Otolaryngology

## 2024-08-22 ENCOUNTER — Ambulatory Visit (INDEPENDENT_AMBULATORY_CARE_PROVIDER_SITE_OTHER): Payer: Medicare Other | Admitting: Otolaryngology

## 2024-08-22 VITALS — BP 149/70 | HR 62 | Temp 97.7°F | Ht 69.0 in | Wt 155.0 lb

## 2024-08-22 DIAGNOSIS — H6123 Impacted cerumen, bilateral: Secondary | ICD-10-CM

## 2024-08-22 DIAGNOSIS — H903 Sensorineural hearing loss, bilateral: Secondary | ICD-10-CM

## 2024-08-22 NOTE — Progress Notes (Signed)
 Patient ID: Billy Hunt, male   DOB: [birth year], 78 y.o.   MRN: [id number]  Follow up: Hearing loss  HPI: The patient is a 78 year old male who returns today for his follow-up evaluation.  He was previously seen for asymmetric hearing loss, worse on the left side.  His MRI scan was negative for retrocochlear lesion.  He was fitted with bilateral new hearing aids 6 months ago at [facility].  He reports significant improvement in his hearing with the new hearing aids.  Currently he denies any otalgia, otorrhea, or vertigo.  Exam: General: Communicates without difficulty, well nourished, no acute distress. Head: Normocephalic, no evidence injury, no tenderness, facial buttresses intact without stepoff. Face/sinus: No tenderness to palpation and percussion. Facial movement is normal and symmetric. Eyes: PERRL, EOMI. No scleral icterus, conjunctivae clear. Neuro: CN II exam reveals vision grossly intact.  No nystagmus at any point of gaze. EAC: Bilateral cerumen impaction.  Under the operating microscope, the cerumen is carefully removed with a combination of cerumen currette, alligator forceps, and suction catheters.  After the cerumen is removed, the TMs are noted to be normal. Nose: External evaluation reveals normal support and skin without lesions.  Dorsum is intact.  Anterior rhinoscopy reveals pink mucosa over anterior aspect of inferior turbinates and intact septum.  No purulence noted. Oral:  Oral cavity and oropharynx are intact, symmetric, without erythema or edema.  Mucosa is moist without lesions. Neck: Full range of motion without pain.  There is no significant lymphadenopathy.  No masses palpable.  Thyroid  bed within normal limits to palpation.  Parotid glands and submandibular glands equal bilaterally without mass.  Trachea is midline. Neuro:  CN 2-12 grossly intact.   Procedure: Bilateral cerumen disimpaction Anesthesia: None Description: Under the operating microscope, the cerumen is  carefully removed with a combination of cerumen currette, alligator forceps, and suction catheters.  After the cerumen is removed, the TMs are noted to be normal.  No mass, erythema, or lesions. The patient tolerated the procedure well.   Assessment  1.  Bilateral cerumen impaction. The patients ear canals, tympanic membranes and middle ear spaces are otherwise normal.  2.  Subjectively stable bilateral high-frequency sensorineural hearing loss is noted with minimal asymmetry.  3.  Previous brain MRI showed no evidence of intracranial lesion.   Plan: 1. Otomicroscopy with cerumen disimpaction.  2. The physical exam findings are reviewed with the patient. 3. Continue the use of his new hearing aids. 4. The patient will follow up for re-evaluation in 12 months.

## 2024-09-17 ENCOUNTER — Telehealth: Payer: Self-pay

## 2024-09-17 NOTE — Telephone Encounter (Signed)
 Auth Submission: NO AUTH NEEDED Site of care: Site of care: [facility] Payer: medicare a/b, aarp supp Medication & CPT/J Code(s) submitted: Leqvio  (Inclisiran) J1306 Diagnosis Code:  Route of submission (phone, fax, portal):  Phone # Fax # Auth type: Buy/Bill hb Units/visits requested: 284mg , q69months Reference number:  Approval from: 09/17/24 to 08/13/25

## 2024-10-13 ENCOUNTER — Other Ambulatory Visit: Payer: Self-pay | Admitting: Internal Medicine

## 2024-10-13 ENCOUNTER — Ambulatory Visit: Payer: Medicare Other

## 2024-10-13 VITALS — Ht 69.0 in | Wt 155.0 lb

## 2024-10-13 DIAGNOSIS — Z0000 Encounter for general adult medical examination without abnormal findings: Secondary | ICD-10-CM

## 2024-10-13 DIAGNOSIS — M1A9XX0 Chronic gout, unspecified, without tophus (tophi): Secondary | ICD-10-CM

## 2024-10-13 MED ORDER — PREDNISONE 20 MG PO TABS: 40.0000 mg | ORAL_TABLET | Freq: Every day | ORAL | 0 refills | Status: AC

## 2024-10-13 MED ORDER — ALLOPURINOL 100 MG PO TABS: 100.0000 mg | ORAL_TABLET | Freq: Every day | ORAL | 5 refills | Status: AC

## 2024-10-13 NOTE — Patient Instructions (Signed)
 Billy Hunt,  Thank you for taking the time for your Medicare Wellness Visit. I appreciate your continued commitment to your health goals. Please review the care plan we discussed, and feel free to reach out if I can assist you further.  Please note that Annual Wellness Visits do not include a physical exam. Some assessments may be limited, especially if the visit was conducted virtually. If needed, we may recommend an in-person follow-up with your provider.  Ongoing Care Seeing your primary care provider every 3 to 6 months helps us  monitor your health and provide consistent, personalized care.   Referrals If a referral was made during today's visit and you haven't received any updates within two weeks, please contact the referred provider directly to check on the status.  Recommended Screenings:  Health Maintenance  Topic Date Due   COVID-19 Vaccine (5 - 2025-26 season) 05/12/2024   Medicare Annual Wellness Visit  10/08/2024   DTaP/Tdap/Td vaccine (3 - Td or Tdap) 11/18/2031   Pneumococcal Vaccine for age over 2  Completed   Flu Shot  Completed   Hepatitis C Screening  Completed   Zoster (Shingles) Vaccine  Completed   Meningitis B Vaccine  Aged Out   Colon Cancer Screening  Discontinued       10/13/2024    1:10 PM  Advanced Directives  Does Patient Have a Medical Advance Directive? Yes    Vision: Annual vision screenings are recommended for early detection of glaucoma, cataracts, and diabetic retinopathy. These exams can also reveal signs of chronic conditions such as diabetes and high blood pressure.  Dental: Annual dental screenings help detect early signs of oral cancer, gum disease, and other conditions linked to overall health, including heart disease and diabetes.  Please see the attached documents for additional preventive care recommendations.

## 2024-10-14 NOTE — Progress Notes (Signed)
 Spoke with patient. He has picked up both prescriptions and verbalized understanding of treatment plan with verbal agreement. Understands to start Allopurinol  after gout flare has resolved.

## 2024-10-20 ENCOUNTER — Ambulatory Visit: Admitting: "Endocrinology

## 2024-10-22 ENCOUNTER — Ambulatory Visit

## 2024-11-17 ENCOUNTER — Ambulatory Visit: Admitting: "Endocrinology

## 2024-11-18 ENCOUNTER — Other Ambulatory Visit

## 2024-11-21 ENCOUNTER — Ambulatory Visit: Admitting: Internal Medicine

## 2024-11-26 ENCOUNTER — Ambulatory Visit: Admitting: Internal Medicine

## 2024-11-28 ENCOUNTER — Ambulatory Visit: Admitting: Urology

## 2025-10-14 ENCOUNTER — Ambulatory Visit: Payer: Self-pay
